# Patient Record
Sex: Female | Born: 1970
Health system: Southern US, Community
[De-identification: ages and names within clinical notes are randomized; demographics above are authoritative.]

## PROBLEM LIST (undated history)

## (undated) DIAGNOSIS — B029 Zoster without complications: Secondary | ICD-10-CM

## (undated) DIAGNOSIS — I10 Essential (primary) hypertension: Secondary | ICD-10-CM

## (undated) DIAGNOSIS — D649 Anemia, unspecified: Secondary | ICD-10-CM

## (undated) DIAGNOSIS — E538 Deficiency of other specified B group vitamins: Secondary | ICD-10-CM

## (undated) DIAGNOSIS — E559 Vitamin D deficiency, unspecified: Secondary | ICD-10-CM

## (undated) DIAGNOSIS — G35 Multiple sclerosis: Secondary | ICD-10-CM

## (undated) DIAGNOSIS — N920 Excessive and frequent menstruation with regular cycle: Secondary | ICD-10-CM

## (undated) DIAGNOSIS — H539 Unspecified visual disturbance: Secondary | ICD-10-CM

## (undated) HISTORY — DX: Multiple sclerosis: G35

## (undated) HISTORY — DX: Vitamin D deficiency, unspecified: E55.9

## (undated) HISTORY — DX: Anemia, unspecified: D64.9

## (undated) HISTORY — DX: Essential (primary) hypertension: I10

## (undated) HISTORY — PX: CHOLECYSTECTOMY: SHX55

## (undated) HISTORY — DX: Excessive and frequent menstruation with regular cycle: N92.0

## (undated) HISTORY — DX: Deficiency of other specified B group vitamins: E53.8

## (undated) HISTORY — DX: Unspecified visual disturbance: H53.9

## (undated) HISTORY — PX: GASTRIC BYPASS: SHX52

## (undated) HISTORY — DX: Zoster without complications: B02.9

---

## 2011-12-31 ENCOUNTER — Emergency Department: Payer: Self-pay | Admitting: Emergency Medicine

## 2011-12-31 LAB — CBC
HCT: 29.9 % — ABNORMAL LOW (ref 35.0–47.0)
MCH: 21.5 pg — ABNORMAL LOW (ref 26.0–34.0)
MCHC: 30.7 g/dL — ABNORMAL LOW (ref 32.0–36.0)
MCV: 70 fL — ABNORMAL LOW (ref 80–100)
WBC: 4.6 10*3/uL (ref 3.6–11.0)

## 2011-12-31 LAB — TROPONIN I: Troponin-I: <0.02 ng/mL

## 2011-12-31 LAB — URINALYSIS, COMPLETE
Bacteria: NONE SEEN
Ketone: NEGATIVE
Leukocyte Esterase: NEGATIVE
Nitrite: NEGATIVE
Squamous Epithelial: 2
WBC UR: 1 /HPF (ref 0–5)

## 2011-12-31 LAB — BASIC METABOLIC PANEL
BUN: 7 mg/dL (ref 7–18)
Calcium, Total: 8.8 mg/dL (ref 8.5–10.1)
Creatinine: 1.12 mg/dL (ref 0.60–1.30)
EGFR (African American): >60
EGFR (Non-African Amer.): >60
Osmolality: 279 (ref 275–301)
Potassium: 3.6 mmol/L (ref 3.5–5.1)
Sodium: 141 mmol/L (ref 136–145)

## 2011-12-31 LAB — CK TOTAL AND CKMB (NOT AT ARMC)
CK, Total: 88 U/L (ref 21–215)
CK-MB: 0.5 ng/mL (ref 0.5–3.6)

## 2011-12-31 LAB — PREGNANCY, URINE: Pregnancy Test, Urine: NEGATIVE m[IU]/mL

## 2011-12-31 LAB — LIPASE, BLOOD: Lipase: 132 U/L (ref 73–393)

## 2012-01-06 ENCOUNTER — Emergency Department: Payer: Self-pay | Admitting: Emergency Medicine

## 2012-01-06 LAB — URINALYSIS, COMPLETE
Blood: NEGATIVE
Glucose,UR: NEGATIVE mg/dL (ref 0–75)
Ketone: NEGATIVE
Leukocyte Esterase: NEGATIVE
Nitrite: NEGATIVE
Ph: 6 (ref 4.5–8.0)
RBC,UR: <1 /HPF (ref 0–5)
Specific Gravity: 1.014 (ref 1.003–1.030)

## 2012-01-06 LAB — CBC
HGB: 8.7 g/dL — ABNORMAL LOW (ref 12.0–16.0)
MCV: 71 fL — ABNORMAL LOW (ref 80–100)
RBC: 4 10*6/uL (ref 3.80–5.20)

## 2012-01-06 LAB — BASIC METABOLIC PANEL
Anion Gap: 10 (ref 7–16)
BUN: 8 mg/dL (ref 7–18)
Chloride: 107 mmol/L (ref 98–107)
Co2: 26 mmol/L (ref 21–32)
Creatinine: 0.9 mg/dL (ref 0.60–1.30)
EGFR (Non-African Amer.): >60

## 2012-01-06 LAB — PREGNANCY, URINE: Pregnancy Test, Urine: NEGATIVE m[IU]/mL

## 2012-01-13 ENCOUNTER — Ambulatory Visit: Payer: Self-pay | Admitting: Surgery

## 2012-01-13 LAB — BASIC METABOLIC PANEL
BUN: 7 mg/dL (ref 7–18)
Calcium, Total: 8.4 mg/dL — ABNORMAL LOW (ref 8.5–10.1)
Chloride: 108 mmol/L — ABNORMAL HIGH (ref 98–107)
Creatinine: 0.93 mg/dL (ref 0.60–1.30)
EGFR (Non-African Amer.): >60
Glucose: 78 mg/dL (ref 65–99)
Osmolality: 280 (ref 275–301)
Sodium: 142 mmol/L (ref 136–145)

## 2012-01-13 LAB — CBC WITH DIFFERENTIAL/PLATELET
Basophil #: 0 10*3/uL (ref 0.0–0.1)
Eosinophil #: 0 10*3/uL (ref 0.0–0.7)
HCT: 27.9 % — ABNORMAL LOW (ref 35.0–47.0)
HGB: 8.6 g/dL — ABNORMAL LOW (ref 12.0–16.0)
Lymphocyte #: 0.2 10*3/uL — ABNORMAL LOW (ref 1.0–3.6)
Lymphocyte %: 4.9 %
MCH: 22 pg — ABNORMAL LOW (ref 26.0–34.0)
MCHC: 30.8 g/dL — ABNORMAL LOW (ref 32.0–36.0)
MCV: 71 fL — ABNORMAL LOW (ref 80–100)
Neutrophil #: 3.5 10*3/uL (ref 1.4–6.5)
Neutrophil %: 82.5 %
RBC: 3.91 10*6/uL (ref 3.80–5.20)

## 2012-01-13 LAB — HEPATIC FUNCTION PANEL A (ARMC)
Alkaline Phosphatase: 87 U/L (ref 50–136)
Bilirubin, Direct: <0.1 mg/dL (ref 0.00–0.20)
Bilirubin,Total: 0.4 mg/dL (ref 0.2–1.0)
SGOT(AST): 18 U/L (ref 15–37)
SGPT (ALT): 16 U/L

## 2012-01-20 ENCOUNTER — Ambulatory Visit: Payer: Self-pay | Admitting: Surgery

## 2012-01-20 LAB — PREGNANCY, URINE: Pregnancy Test, Urine: NEGATIVE m[IU]/mL

## 2012-01-21 LAB — COMPREHENSIVE METABOLIC PANEL
Alkaline Phosphatase: 139 U/L — ABNORMAL HIGH (ref 50–136)
BUN: 6 mg/dL — ABNORMAL LOW (ref 7–18)
Bilirubin,Total: 0.7 mg/dL (ref 0.2–1.0)
Calcium, Total: 8.3 mg/dL — ABNORMAL LOW (ref 8.5–10.1)
Chloride: 107 mmol/L (ref 98–107)
EGFR (African American): >60
EGFR (Non-African Amer.): >60
Glucose: 96 mg/dL (ref 65–99)
Potassium: 4.3 mmol/L (ref 3.5–5.1)
SGOT(AST): 99 U/L — ABNORMAL HIGH (ref 15–37)
SGPT (ALT): 73 U/L
Sodium: 140 mmol/L (ref 136–145)
Total Protein: 5.8 g/dL — ABNORMAL LOW (ref 6.4–8.2)

## 2012-01-21 LAB — CBC WITH DIFFERENTIAL/PLATELET
Basophil %: 0.1 %
Eosinophil %: 0 %
HCT: 26.6 % — ABNORMAL LOW (ref 35.0–47.0)
HGB: 8.2 g/dL — ABNORMAL LOW (ref 12.0–16.0)
Lymphocyte #: 0.2 10*3/uL — ABNORMAL LOW (ref 1.0–3.6)
Lymphocyte %: 4.7 %
MCH: 22.5 pg — ABNORMAL LOW (ref 26.0–34.0)
MCHC: 31 g/dL — ABNORMAL LOW (ref 32.0–36.0)
MCV: 73 fL — ABNORMAL LOW (ref 80–100)
Monocyte #: 0.5 x10 3/mm (ref 0.2–0.9)
Monocyte %: 10.3 %
Neutrophil #: 4 10*3/uL (ref 1.4–6.5)
WBC: 4.7 10*3/uL (ref 3.6–11.0)

## 2012-03-29 ENCOUNTER — Ambulatory Visit: Payer: Self-pay | Admitting: Family Medicine

## 2012-04-10 ENCOUNTER — Ambulatory Visit: Payer: Self-pay | Admitting: Family Medicine

## 2012-07-05 ENCOUNTER — Ambulatory Visit: Payer: Self-pay | Admitting: Family Medicine

## 2012-07-17 ENCOUNTER — Emergency Department: Payer: Self-pay | Admitting: Emergency Medicine

## 2012-07-17 LAB — CBC
MCH: 23.6 pg — ABNORMAL LOW (ref 26.0–34.0)
MCV: 75 fL — ABNORMAL LOW (ref 80–100)
Platelet: 233 10*3/uL (ref 150–440)
RBC: 3.62 10*6/uL — ABNORMAL LOW (ref 3.80–5.20)
RDW: 18.8 % — ABNORMAL HIGH (ref 11.5–14.5)

## 2012-07-17 LAB — TROPONIN I: Troponin-I: <0.02 ng/mL

## 2012-07-17 LAB — BASIC METABOLIC PANEL
Anion Gap: 7 (ref 7–16)
BUN: 10 mg/dL (ref 7–18)
Creatinine: 0.82 mg/dL (ref 0.60–1.30)
EGFR (African American): >60
Osmolality: 279 (ref 275–301)
Sodium: 141 mmol/L (ref 136–145)

## 2012-07-17 LAB — CK TOTAL AND CKMB (NOT AT ARMC): CK-MB: <0.5 ng/mL — ABNORMAL LOW (ref 0.5–3.6)

## 2013-01-05 ENCOUNTER — Emergency Department: Payer: Self-pay | Admitting: Emergency Medicine

## 2013-01-05 LAB — TROPONIN I: Troponin-I: <0.02 ng/mL

## 2013-01-05 LAB — CBC
MCH: 23.1 pg — ABNORMAL LOW (ref 26.0–34.0)
MCHC: 31.5 g/dL — ABNORMAL LOW (ref 32.0–36.0)
MCV: 73 fL — ABNORMAL LOW (ref 80–100)
Platelet: 227 10*3/uL (ref 150–440)
RBC: 4.21 10*6/uL (ref 3.80–5.20)
RDW: 16.5 % — ABNORMAL HIGH (ref 11.5–14.5)

## 2013-01-05 LAB — BASIC METABOLIC PANEL
Calcium, Total: 9 mg/dL (ref 8.5–10.1)
Chloride: 107 mmol/L (ref 98–107)
Co2: 26 mmol/L (ref 21–32)
Creatinine: 1.03 mg/dL (ref 0.60–1.30)
EGFR (Non-African Amer.): >60
Glucose: 91 mg/dL (ref 65–99)
Osmolality: 275 (ref 275–301)
Sodium: 138 mmol/L (ref 136–145)

## 2013-12-19 ENCOUNTER — Emergency Department: Payer: Self-pay | Admitting: Emergency Medicine

## 2013-12-20 LAB — PRO B NATRIURETIC PEPTIDE: B-Type Natriuretic Peptide: 181 pg/mL — ABNORMAL HIGH (ref 0–125)

## 2013-12-20 LAB — BASIC METABOLIC PANEL
Anion Gap: 6 — ABNORMAL LOW (ref 7–16)
BUN: 10 mg/dL (ref 7–18)
CALCIUM: 8.8 mg/dL (ref 8.5–10.1)
CO2: 28 mmol/L (ref 21–32)
CREATININE: 1.09 mg/dL (ref 0.60–1.30)
Chloride: 106 mmol/L (ref 98–107)
EGFR (Non-African Amer.): >60
Glucose: 91 mg/dL (ref 65–99)
Osmolality: 278 (ref 275–301)
Potassium: 3.5 mmol/L (ref 3.5–5.1)
Sodium: 140 mmol/L (ref 136–145)

## 2013-12-20 LAB — CBC
HCT: 27.2 % — ABNORMAL LOW (ref 35.0–47.0)
HGB: 8.4 g/dL — AB (ref 12.0–16.0)
MCH: 21.6 pg — AB (ref 26.0–34.0)
MCHC: 30.8 g/dL — ABNORMAL LOW (ref 32.0–36.0)
MCV: 70 fL — ABNORMAL LOW (ref 80–100)
Platelet: 231 10*3/uL (ref 150–440)
RBC: 3.88 10*6/uL (ref 3.80–5.20)
RDW: 18.2 % — ABNORMAL HIGH (ref 11.5–14.5)
WBC: 5.3 10*3/uL (ref 3.6–11.0)

## 2013-12-20 LAB — TROPONIN I

## 2013-12-26 ENCOUNTER — Emergency Department: Payer: Self-pay | Admitting: Emergency Medicine

## 2013-12-26 LAB — CBC WITH DIFFERENTIAL/PLATELET
BASOS PCT: 1.4 %
Basophil #: 0.1 10*3/uL (ref 0.0–0.1)
Eosinophil #: 0 10*3/uL (ref 0.0–0.7)
Eosinophil %: 0 %
HCT: 27.5 % — ABNORMAL LOW (ref 35.0–47.0)
HGB: 8.4 g/dL — ABNORMAL LOW (ref 12.0–16.0)
LYMPHS PCT: 6.3 %
Lymphocyte #: 0.3 10*3/uL — ABNORMAL LOW (ref 1.0–3.6)
MCH: 21.4 pg — ABNORMAL LOW (ref 26.0–34.0)
MCHC: 30.6 g/dL — AB (ref 32.0–36.0)
MCV: 70 fL — AB (ref 80–100)
Monocyte #: 0.5 x10 3/mm (ref 0.2–0.9)
Monocyte %: 11.4 %
Neutrophil #: 3.4 10*3/uL (ref 1.4–6.5)
Neutrophil %: 80.9 %
Platelet: 256 10*3/uL (ref 150–440)
RBC: 3.94 10*6/uL (ref 3.80–5.20)
RDW: 17.8 % — ABNORMAL HIGH (ref 11.5–14.5)
WBC: 4.3 10*3/uL (ref 3.6–11.0)

## 2013-12-26 LAB — COMPREHENSIVE METABOLIC PANEL
ALBUMIN: 3.8 g/dL (ref 3.4–5.0)
ANION GAP: 5 — AB (ref 7–16)
Alkaline Phosphatase: 115 U/L
BILIRUBIN TOTAL: 0.4 mg/dL (ref 0.2–1.0)
BUN: 8 mg/dL (ref 7–18)
CO2: 28 mmol/L (ref 21–32)
Calcium, Total: 8.8 mg/dL (ref 8.5–10.1)
Chloride: 106 mmol/L (ref 98–107)
Creatinine: 1.09 mg/dL (ref 0.60–1.30)
Glucose: 94 mg/dL (ref 65–99)
Osmolality: 276 (ref 275–301)
Potassium: 3.6 mmol/L (ref 3.5–5.1)
SGOT(AST): 23 U/L (ref 15–37)
SGPT (ALT): 20 U/L (ref 12–78)
SODIUM: 139 mmol/L (ref 136–145)
TOTAL PROTEIN: 7.3 g/dL (ref 6.4–8.2)

## 2013-12-26 LAB — TROPONIN I

## 2013-12-26 LAB — D-DIMER(ARMC): D-DIMER: 432 ng/mL

## 2013-12-27 DIAGNOSIS — R079 Chest pain, unspecified: Secondary | ICD-10-CM | POA: Insufficient documentation

## 2013-12-27 DIAGNOSIS — R002 Palpitations: Secondary | ICD-10-CM | POA: Insufficient documentation

## 2013-12-27 DIAGNOSIS — R0602 Shortness of breath: Secondary | ICD-10-CM | POA: Insufficient documentation

## 2014-06-27 ENCOUNTER — Ambulatory Visit: Payer: Self-pay | Admitting: Hematology and Oncology

## 2014-06-27 LAB — COMPREHENSIVE METABOLIC PANEL
ALK PHOS: 105 U/L
Albumin: 3.8 g/dL (ref 3.4–5.0)
Anion Gap: 5 — ABNORMAL LOW (ref 7–16)
BILIRUBIN TOTAL: 0.4 mg/dL (ref 0.2–1.0)
BUN: 7 mg/dL (ref 7–18)
CO2: 28 mmol/L (ref 21–32)
CREATININE: 0.99 mg/dL (ref 0.60–1.30)
Calcium, Total: 8.3 mg/dL — ABNORMAL LOW (ref 8.5–10.1)
Chloride: 106 mmol/L (ref 98–107)
EGFR (African American): >60
Glucose: 89 mg/dL (ref 65–99)
OSMOLALITY: 275 (ref 275–301)
Potassium: 3.9 mmol/L (ref 3.5–5.1)
SGOT(AST): 15 U/L (ref 15–37)
SGPT (ALT): 20 U/L
SODIUM: 139 mmol/L (ref 136–145)
TOTAL PROTEIN: 6.9 g/dL (ref 6.4–8.2)

## 2014-06-27 LAB — CBC CANCER CENTER
Basophil #: 0 x10 3/mm (ref 0.0–0.1)
Basophil %: 1 %
EOS ABS: 0 x10 3/mm (ref 0.0–0.7)
Eosinophil %: 0 %
HCT: 27.1 % — ABNORMAL LOW (ref 35.0–47.0)
HGB: 8.1 g/dL — ABNORMAL LOW (ref 12.0–16.0)
LYMPHS ABS: 0.3 x10 3/mm — AB (ref 1.0–3.6)
LYMPHS PCT: 8.1 %
MCH: 19.8 pg — ABNORMAL LOW (ref 26.0–34.0)
MCHC: 29.8 g/dL — ABNORMAL LOW (ref 32.0–36.0)
MCV: 67 fL — ABNORMAL LOW (ref 80–100)
MONO ABS: 0.4 x10 3/mm (ref 0.2–0.9)
Monocyte %: 11.7 %
NEUTROS ABS: 3 x10 3/mm (ref 1.4–6.5)
NEUTROS PCT: 79.2 %
Platelet: 261 x10 3/mm (ref 150–440)
RBC: 4.07 10*6/uL (ref 3.80–5.20)
RDW: 18.6 % — ABNORMAL HIGH (ref 11.5–14.5)
WBC: 3.8 x10 3/mm (ref 3.6–11.0)

## 2014-06-27 LAB — IRON AND TIBC
IRON BIND. CAP.(TOTAL): 424 ug/dL (ref 250–450)
IRON SATURATION: 2 %
IRON: 10 ug/dL — AB (ref 50–170)
Unbound Iron-Bind.Cap.: 414 ug/dL

## 2014-06-27 LAB — FERRITIN: Ferritin (ARMC): 3 ng/mL — ABNORMAL LOW (ref 8–388)

## 2014-06-27 LAB — RETICULOCYTES
Absolute Retic Count: 0.0478 10*6/uL (ref 0.019–0.186)
RETICULOCYTE: 1.17 % (ref 0.4–3.1)

## 2014-06-27 LAB — MAGNESIUM: Magnesium: 1.9 mg/dL

## 2014-06-29 LAB — PROT IMMUNOELECTROPHORES(ARMC)

## 2014-07-10 ENCOUNTER — Ambulatory Visit: Payer: Self-pay | Admitting: Hematology and Oncology

## 2014-07-11 DIAGNOSIS — I38 Endocarditis, valve unspecified: Secondary | ICD-10-CM | POA: Insufficient documentation

## 2014-07-11 DIAGNOSIS — I119 Hypertensive heart disease without heart failure: Secondary | ICD-10-CM | POA: Insufficient documentation

## 2014-07-19 LAB — IRON AND TIBC
IRON BIND. CAP.(TOTAL): 387 ug/dL (ref 250–450)
Iron Saturation: 36 %
Iron: 141 ug/dL (ref 50–170)
Unbound Iron-Bind.Cap.: 246 ug/dL

## 2014-07-19 LAB — CBC CANCER CENTER
Basophil #: 0 x10 3/mm (ref 0.0–0.1)
Basophil %: 0.2 %
EOS PCT: 0 %
Eosinophil #: 0 x10 3/mm (ref 0.0–0.7)
HCT: 33 % — AB (ref 35.0–47.0)
HGB: 10.3 g/dL — ABNORMAL LOW (ref 12.0–16.0)
Lymphocyte #: 0.2 x10 3/mm — ABNORMAL LOW (ref 1.0–3.6)
Lymphocyte %: 5.7 %
MCH: 22.7 pg — ABNORMAL LOW (ref 26.0–34.0)
MCHC: 31.1 g/dL — ABNORMAL LOW (ref 32.0–36.0)
MCV: 73 fL — ABNORMAL LOW (ref 80–100)
Monocyte #: 0.4 x10 3/mm (ref 0.2–0.9)
Monocyte %: 11.3 %
NEUTROS ABS: 2.7 x10 3/mm (ref 1.4–6.5)
Neutrophil %: 82.8 %
Platelet: 224 x10 3/mm (ref 150–440)
RBC: 4.51 10*6/uL (ref 3.80–5.20)
RDW: 26.9 % — ABNORMAL HIGH (ref 11.5–14.5)
WBC: 3.2 x10 3/mm — ABNORMAL LOW (ref 3.6–11.0)

## 2014-07-19 LAB — FERRITIN: FERRITIN (ARMC): 99 ng/mL (ref 8–388)

## 2014-08-08 LAB — CANCER CENTER HEMOGLOBIN: HGB: 10.9 g/dL — AB (ref 12.0–16.0)

## 2014-08-10 ENCOUNTER — Ambulatory Visit: Payer: Self-pay | Admitting: Hematology and Oncology

## 2014-09-07 LAB — CBC CANCER CENTER
Basophil #: 0 x10 3/mm (ref 0.0–0.1)
Basophil %: 0.3 %
EOS PCT: 0.1 %
Eosinophil #: 0 x10 3/mm (ref 0.0–0.7)
HCT: 38.3 % (ref 35.0–47.0)
HGB: 12.5 g/dL (ref 12.0–16.0)
LYMPHS ABS: 0.2 x10 3/mm — AB (ref 1.0–3.6)
Lymphocyte %: 5.7 %
MCH: 26.9 pg (ref 26.0–34.0)
MCHC: 32.6 g/dL (ref 32.0–36.0)
MCV: 83 fL (ref 80–100)
MONOS PCT: 11.8 %
Monocyte #: 0.4 x10 3/mm (ref 0.2–0.9)
NEUTROS ABS: 2.6 x10 3/mm (ref 1.4–6.5)
Neutrophil %: 82.1 %
Platelet: 195 x10 3/mm (ref 150–440)
RBC: 4.63 10*6/uL (ref 3.80–5.20)
RDW: 23.5 % — ABNORMAL HIGH (ref 11.5–14.5)
WBC: 3.1 x10 3/mm — AB (ref 3.6–11.0)

## 2014-09-07 LAB — IRON AND TIBC
IRON BIND. CAP.(TOTAL): 309 ug/dL (ref 250–450)
Iron Saturation: 20 %
Iron: 63 ug/dL (ref 50–170)
Unbound Iron-Bind.Cap.: 246 ug/dL

## 2014-09-07 LAB — FERRITIN: Ferritin (ARMC): 52 ng/mL (ref 8–388)

## 2014-09-10 ENCOUNTER — Ambulatory Visit: Payer: Self-pay | Admitting: Internal Medicine

## 2014-09-10 ENCOUNTER — Ambulatory Visit: Payer: Self-pay | Admitting: Hematology and Oncology

## 2014-10-09 ENCOUNTER — Ambulatory Visit
Admit: 2014-10-09 | Disposition: A | Discharge status: Home / Self Care | Payer: Self-pay | Attending: Hematology and Oncology | Admitting: Hematology and Oncology

## 2014-11-09 ENCOUNTER — Ambulatory Visit
Admit: 2014-11-09 | Disposition: A | Discharge status: Home / Self Care | Payer: Self-pay | Attending: Hematology and Oncology | Admitting: Hematology and Oncology

## 2014-11-30 ENCOUNTER — Other Ambulatory Visit: Payer: Self-pay | Admitting: Neurology

## 2014-11-30 NOTE — Telephone Encounter (Signed)
Was a [facility] pt.  One r/f sent in and lmom for her to call for appt.--will not be able to refill again until she is seen/fim

## 2014-12-02 NOTE — Op Note (Signed)
PATIENT NAME:  Jody Lee, Jody Lee MR#:  [id number] DATE OF BIRTH:  03/07/1971  DATE OF PROCEDURE:  01/20/2012  PREOPERATIVE DIAGNOSIS: Symptomatic cholelithiasis.   POSTOPERATIVE DIAGNOSIS: Symptomatic cholelithiasis.   PROCEDURE: Laparoscopic cholecystectomy.   SURGEON: Adelise Buswell E. Excell Seltzer, MD  ANESTHESIA: General with endotracheal tube.   INDICATIONS: This is a patient with recurrent epigastric and right upper quadrant pain associated with fatty food intolerance and work-up showing gallstones. Preoperatively we discussed rationale for surgery. She has had a prior open Roux-en-Y bypass for her morbid obesity. We discussed specifically the difficulty in obtaining access and the potential for having to convert to an open procedure to avoid bowel injury due to adhesions and the risks of bleeding, infection, recurrence of symptoms, failure to resolve her symptoms, open procedure, bile duct damage, bile duct leak, and the inability to perform an ERCP postoperatively because of her prior bypass. This was all reviewed for she and her family in the preop holding area. They understood and agreed to proceed. We discussed possible bile duct injury requiring additional surgery including common duct exploration if retained stones were to become evident in the postoperative period but her liver function tests are completely normal preop. She understood and agreed to proceed.Marland Kitchen   FINDINGS: Very diminutive cystic duct, unlikely that it would have amenable to cholangiography because of its tiny size. Cystic artery was very small as well. Adhesions on the anterior surface of a very distended gallbladder. The fundus of the gallbladder seemed edematous.   There were adhesions in the epigastric area. No adhesions in the infraumbilical area or pelvis. No sign of bowel injury upon entry.   DESCRIPTION OF PROCEDURE: Patient was induced to general anesthesia, given IV antibiotics. VTE prophylaxis was in place. She was prepped  and draped in sterile fashion. Marcaine was infiltrated in skin and subcutaneous tissues in the infraumbilical area well away from her upper abdominal midline incision. An incision was made. An extra long Veress needle was utilized to obtain a pneumoperitoneum and then with the camera in the Visiport function of the 5 mm trocar the abdominal cavity was entered and explored. Under direct vision a 10 mm epigastric port and two lateral 5 mm ports were placed. This was made difficult because of her morbid obesity. The camera was placed in both lateral ports as well as the epigastric site viewing back at the periumbilical site. There was no sign of adhesions or bowel injury, however, there was some evidence of insufflation of the preperitoneal space by CO2 with the Veress needle. This was observed and decreased during the case.   Again, no sign of bowel injury or adhesions in the infraumbilical area at entry.   The gallbladder was placed on tension. Peritoneum over the infundibulum was incised bluntly. The cystic duct/gallbladder junction was well identified. Again, the cystic duct was found to be very diminutive. It was doubly clipped and divided. The cystic artery was doubly clipped and divided and the gallbladder was taken from the gallbladder fossa with electrocautery, passed out through the epigastric port site with the aid of an Endo Catch bag. The area was irrigated with copious amounts of normal saline. Hemostasis was maintained with electrocautery and checked and found to be adequate. There was no sign of bile leak or bleeding at this point. Again the camera was placed back in the epigastric site to view back at the periumbilical site. The insufflated preperitoneal area had diminished considerably and there was no sign of bowel injury and once assuring that  hemostasis was adequate pneumoperitoneum was released. All ports were removed after placing a 10 mm JP drain into the foramen of Winslow and tying it in  with 3-0 nylon. An attempt at closure of the epigastric site was performed with figure-of-eight 0 Vicryl which was made difficult due to her morbid obesity and then 4-0 subcuticular Monocryl was used on all skin edges. Steri-Strips, Mastisol, and sterile dressings were placed. The JP drain was placed to bulb suction.   Patient tolerated this procedure well. There were no complications. She was taken to the recovery room in stable condition to be admitted for observation.   ____________________________ Adah Salvage Excell Seltzer, MD rec:cms D: 01/20/2012 10:23:00 ET T: 01/20/2012 11:27:22 ET JOB#: [id number]  cc: Adah Salvage. Excell Seltzer, MD, <Dictator> Lattie Haw MD ELECTRONICALLY SIGNED 01/20/2012 19:51

## 2014-12-02 NOTE — H&P (Signed)
PATIENT NAME:  Jody Lee, Jody Lee MR#:  [id number] DATE OF BIRTH:  1971-07-03  DATE OF ADMISSION:  01/19/2012  CHIEF COMPLAINT: Right upper quadrant pain.   HISTORY OF PRESENT ILLNESS: This is a patient with gallstones.  She has been in the Emergency Room twice with nausea but no emesis. She has had pain in the right upper quadrant, epigastrium, and back. She had some cramping with fatty food intolerance and had no prior episode prior to several weeks ago.  She has had open gastric bypass surgery in the past.   PAST MEDICAL HISTORY:  1. Multiple sclerosis. 2. Cholelithiasis. 3. Morbid obesity.   PAST SURGICAL HISTORY: Gastric bypass surgery.   ALLERGIES: None.   MEDICATIONS: None.   FAMILY HISTORY: Noncontributory.   SOCIAL HISTORY: The patient is a nonsmoker, does not drink alcohol, and is employed.   REVIEW OF SYSTEMS: 10-system review has been performed and negative with the exception of that mentioned in the history of present illness.   PHYSICAL EXAMINATION:  GENERAL: Morbidly obese female patient, 270 pounds, 5 foot 4.   HEENT: No scleral icterus.   NECK: No palpable neck nodes.   CHEST: Clear to auscultation.   CARDIAC: Regular rate and rhythm.   ABDOMEN: Obese, soft, nontender. There is a midline scar in the epigastrium to the supraumbilical area. Abdomen is otherwise nontender.   EXTREMITIES: Without edema. Calves are nontender.   NEUROLOGIC: Grossly intact.   INTEGUMENT: No jaundice.  Ultrasound shows stones.   LABORATORY DATA: Laboratory values show no sign of choledocholithiasis. She is anemic.   ASSESSMENT AND PLAN: This is a patient with symptomatic cholelithiasis. I have recommended laparoscopic cholecystectomy. The rationale for surgery has been discussed. The options of observation have been reviewed and the risks of bleeding, infection, inability to proceed with the laparoscope, bowel injury, bile duct damage, bile duct leak, retained common bile duct  stone, the inability to perform an ERCP due to her gastric bypass have all been discussed with her. She understood and agreed to proceed.  ____________________________ Adah Salvage Excell Seltzer, MD rec:bjt D: 01/18/2012 14:24:11 ET T: 01/18/2012 14:49:32 ET JOB#: [id number]  cc: Adah Salvage. Excell Seltzer, MD, <Dictator> Lattie Haw MD ELECTRONICALLY SIGNED 01/18/2012 19:46

## 2014-12-04 ENCOUNTER — Ambulatory Visit
Admit: 2014-12-04 | Disposition: A | Discharge status: Home / Self Care | Payer: Self-pay | Attending: Surgery | Admitting: Surgery

## 2015-01-03 DIAGNOSIS — I10 Essential (primary) hypertension: Secondary | ICD-10-CM | POA: Insufficient documentation

## 2015-01-04 ENCOUNTER — Other Ambulatory Visit: Payer: Self-pay

## 2015-01-04 ENCOUNTER — Inpatient Hospital Stay: Payer: 59 | Attending: Hematology and Oncology

## 2015-01-04 ENCOUNTER — Inpatient Hospital Stay: Payer: 59

## 2015-01-04 ENCOUNTER — Encounter (INDEPENDENT_AMBULATORY_CARE_PROVIDER_SITE_OTHER): Payer: Self-pay

## 2015-01-04 DIAGNOSIS — E538 Deficiency of other specified B group vitamins: Secondary | ICD-10-CM | POA: Diagnosis not present

## 2015-01-04 DIAGNOSIS — D649 Anemia, unspecified: Secondary | ICD-10-CM

## 2015-01-04 LAB — VITAMIN B12: Vitamin B-12: 177 pg/mL — ABNORMAL LOW (ref 180–914)

## 2015-01-04 LAB — CBC
HCT: 39.6 % (ref 35.0–47.0)
Hemoglobin: 13.1 g/dL (ref 12.0–16.0)
MCH: 29.1 pg (ref 26.0–34.0)
MCHC: 33.1 g/dL (ref 32.0–36.0)
MCV: 88 fL (ref 80.0–100.0)
Platelets: 208 10*3/uL (ref 150–440)
RBC: 4.5 MIL/uL (ref 3.80–5.20)
RDW: 14 % (ref 11.5–14.5)
WBC: 3 10*3/uL — ABNORMAL LOW (ref 3.6–11.0)

## 2015-01-04 LAB — IRON AND TIBC
Iron: 83 ug/dL (ref 28–170)
Saturation Ratios: 28 % (ref 10.4–31.8)
TIBC: 294 ug/dL (ref 250–450)
UIBC: 211 ug/dL

## 2015-01-04 LAB — FERRITIN: Ferritin: 62 ng/mL (ref 11–307)

## 2015-01-04 MED ORDER — CYANOCOBALAMIN 1000 MCG/ML IJ SOLN
1000.0000 ug | Freq: Once | INTRAMUSCULAR | Status: AC
  Administered 2015-01-04: 1000 ug via INTRAMUSCULAR
  Filled 2015-01-04: qty 1

## 2015-01-05 LAB — VITAMIN D 25 HYDROXY (VIT D DEFICIENCY, FRACTURES): Vit D, 25-Hydroxy: 13.6 ng/mL — ABNORMAL LOW (ref 30.0–100.0)

## 2015-01-11 ENCOUNTER — Inpatient Hospital Stay: Payer: 59 | Attending: Hematology and Oncology | Admitting: Hematology and Oncology

## 2015-01-11 ENCOUNTER — Inpatient Hospital Stay: Payer: 59

## 2015-01-11 VITALS — BP 129/84 | HR 76 | Temp 98.6°F | Wt 228.8 lb

## 2015-01-11 DIAGNOSIS — E559 Vitamin D deficiency, unspecified: Secondary | ICD-10-CM | POA: Diagnosis not present

## 2015-01-11 DIAGNOSIS — G629 Polyneuropathy, unspecified: Secondary | ICD-10-CM | POA: Insufficient documentation

## 2015-01-11 DIAGNOSIS — G35 Multiple sclerosis: Secondary | ICD-10-CM | POA: Diagnosis not present

## 2015-01-11 DIAGNOSIS — Z79899 Other long term (current) drug therapy: Secondary | ICD-10-CM | POA: Insufficient documentation

## 2015-01-11 DIAGNOSIS — Z9884 Bariatric surgery status: Secondary | ICD-10-CM | POA: Diagnosis not present

## 2015-01-11 DIAGNOSIS — E538 Deficiency of other specified B group vitamins: Secondary | ICD-10-CM | POA: Diagnosis not present

## 2015-01-11 DIAGNOSIS — D509 Iron deficiency anemia, unspecified: Secondary | ICD-10-CM | POA: Insufficient documentation

## 2015-01-11 DIAGNOSIS — I10 Essential (primary) hypertension: Secondary | ICD-10-CM | POA: Insufficient documentation

## 2015-01-11 MED ORDER — CYANOCOBALAMIN 1000 MCG/ML IJ SOLN
1000.0000 ug | INTRAMUSCULAR | Status: DC
  Administered 2015-01-11: 1000 ug via INTRAMUSCULAR
  Filled 2015-01-11: qty 1

## 2015-01-18 ENCOUNTER — Ambulatory Visit: Payer: 59

## 2015-01-18 ENCOUNTER — Other Ambulatory Visit: Payer: 59

## 2015-01-25 ENCOUNTER — Inpatient Hospital Stay: Payer: 59

## 2015-01-25 DIAGNOSIS — E538 Deficiency of other specified B group vitamins: Secondary | ICD-10-CM

## 2015-01-25 DIAGNOSIS — D649 Anemia, unspecified: Secondary | ICD-10-CM

## 2015-01-25 DIAGNOSIS — D509 Iron deficiency anemia, unspecified: Secondary | ICD-10-CM | POA: Diagnosis not present

## 2015-01-25 LAB — FOLATE: Folate: 12.5 ng/mL (ref >5.9)

## 2015-01-25 MED ORDER — CYANOCOBALAMIN 1000 MCG/ML IJ SOLN
1000.0000 ug | INTRAMUSCULAR | Status: DC
  Administered 2015-01-25: 1000 ug via INTRAMUSCULAR
  Filled 2015-01-25: qty 1

## 2015-02-01 ENCOUNTER — Inpatient Hospital Stay: Payer: 59

## 2015-02-01 DIAGNOSIS — E538 Deficiency of other specified B group vitamins: Secondary | ICD-10-CM

## 2015-02-01 DIAGNOSIS — D509 Iron deficiency anemia, unspecified: Secondary | ICD-10-CM | POA: Diagnosis not present

## 2015-02-01 MED ORDER — CYANOCOBALAMIN 1000 MCG/ML IJ SOLN
1000.0000 ug | INTRAMUSCULAR | Status: AC
  Administered 2015-02-01: 1000 ug via INTRAMUSCULAR
  Filled 2015-02-01: qty 1

## 2015-02-08 ENCOUNTER — Inpatient Hospital Stay: Payer: 59 | Attending: Hematology and Oncology

## 2015-02-08 VITALS — BP 123/84 | HR 72

## 2015-02-08 DIAGNOSIS — Z9884 Bariatric surgery status: Secondary | ICD-10-CM | POA: Insufficient documentation

## 2015-02-08 DIAGNOSIS — E538 Deficiency of other specified B group vitamins: Secondary | ICD-10-CM | POA: Insufficient documentation

## 2015-02-08 DIAGNOSIS — I10 Essential (primary) hypertension: Secondary | ICD-10-CM | POA: Diagnosis not present

## 2015-02-08 DIAGNOSIS — G35 Multiple sclerosis: Secondary | ICD-10-CM | POA: Diagnosis not present

## 2015-02-08 DIAGNOSIS — Z79899 Other long term (current) drug therapy: Secondary | ICD-10-CM | POA: Insufficient documentation

## 2015-02-08 DIAGNOSIS — E559 Vitamin D deficiency, unspecified: Secondary | ICD-10-CM | POA: Insufficient documentation

## 2015-02-08 DIAGNOSIS — D509 Iron deficiency anemia, unspecified: Secondary | ICD-10-CM | POA: Diagnosis not present

## 2015-02-08 MED ORDER — CYANOCOBALAMIN 1000 MCG/ML IJ SOLN
1000.0000 ug | INTRAMUSCULAR | Status: DC
  Administered 2015-02-08: 1000 ug via INTRAMUSCULAR
  Filled 2015-02-08: qty 1

## 2015-02-15 ENCOUNTER — Inpatient Hospital Stay: Payer: 59

## 2015-02-15 ENCOUNTER — Emergency Department: Payer: 59

## 2015-02-15 ENCOUNTER — Emergency Department
Admission: EM | Admit: 2015-02-15 | Discharge: 2015-02-15 | Disposition: A | Discharge status: Home / Self Care | Payer: 59 | Attending: Emergency Medicine | Admitting: Emergency Medicine

## 2015-02-15 ENCOUNTER — Encounter: Payer: Self-pay | Admitting: *Deleted

## 2015-02-15 DIAGNOSIS — E538 Deficiency of other specified B group vitamins: Secondary | ICD-10-CM | POA: Diagnosis not present

## 2015-02-15 DIAGNOSIS — R52 Pain, unspecified: Secondary | ICD-10-CM

## 2015-02-15 DIAGNOSIS — I10 Essential (primary) hypertension: Secondary | ICD-10-CM | POA: Diagnosis not present

## 2015-02-15 DIAGNOSIS — Z79899 Other long term (current) drug therapy: Secondary | ICD-10-CM | POA: Diagnosis not present

## 2015-02-15 DIAGNOSIS — M79605 Pain in left leg: Secondary | ICD-10-CM | POA: Diagnosis present

## 2015-02-15 MED ORDER — CYANOCOBALAMIN 1000 MCG/ML IJ SOLN
1000.0000 ug | INTRAMUSCULAR | Status: DC
  Administered 2015-02-15: 1000 ug via INTRAMUSCULAR
  Filled 2015-02-15: qty 1

## 2015-02-15 NOTE — ED Notes (Signed)
TO ultrasound via stretcher.  AAOx3.  NAD.

## 2015-02-15 NOTE — ED Notes (Signed)
AAOx3.  Skin warm and dry.  Water given to patient.  Discussed immediate plan of care, good understanding verbalized.  Continue to monitor.

## 2015-02-15 NOTE — ED Notes (Signed)
Pt states she has a h/o MS and gets leg cramps on a reg basis but tonight had a pain on the inner portion of her left calf with radiation to her knee. Pt states it was not normal like her leg cramps and is concerned it may be a clot. Pt states she has a h/o "weak veins". Pt also c/o slight headache in the back of her head. AAO x4 answering all questions app.

## 2015-02-15 NOTE — ED Provider Notes (Signed)
[facility] Emergency Department Provider Note  ___________________________________________  Time seen: 0135  I have reviewed the triage vital signs and the nursing notes.   HISTORY  Chief Complaint Leg Pain   History limited by: Not Limited   HPI Jody Lee is a 44 y.o. female who presents to the emergency department today with complaints of left leg pain. She states the pain started suddenly roughly 3 hours ago. The pain started in her left lower leg and then progressed up to her left knee. She described it as a cramping deep pain. She states she does frequently get cramps in her legs however this was dissimilar to her previous pain. She denies any chest pain or shortness of breath but does state she has had a headache has eased off. She does have a history of phlebitis in the past.   Past Medical History  Diagnosis Date  . Hypertension   . Anemia   . MS (multiple sclerosis)   . Vitamin B 12 deficiency   . Vitamin D deficiency     Patient Active Problem List   Diagnosis Date Noted  . B12 deficiency 01/04/2015    Past Surgical History  Procedure Laterality Date  . Gastric bypass    . Cholecystectomy      Current Outpatient Rx  Name  Route  Sig  Dispense  Refill  . acetaminophen (TYLENOL) 325 MG tablet   Oral   Take 650 mg by mouth every 4 (four) hours as needed.         . calcium-vitamin D (OSCAL WITH D) 500-200 MG-UNIT per tablet   Oral   Take 1 tablet by mouth.         . Cyanocobalamin (VITAMIN B 12 PO)   Oral   Take 1 tablet by mouth daily.         . ergocalciferol (VITAMIN D2) 50000 UNITS capsule   Oral   Take 50,000 Units by mouth once a week.         . ferrous sulfate 325 (65 FE) MG tablet   Oral   Take 325 mg by mouth 2 (two) times daily with a meal.         . folic acid (FOLVITE) 1 MG tablet   Oral   Take 2 mg by mouth daily.         Marland Kitchen GILENYA 0.5 MG CAPS      TAKE 1 CAPSULE DAILY   84 capsule    0     Dispense as written.   . hydrochlorothiazide (MICROZIDE) 12.5 MG capsule               . lisinopril (PRINIVIL,ZESTRIL) 40 MG tablet   Oral   Take 40 mg by mouth daily.         . medroxyPROGESTERone (DEPO-SUBQ PROVERA 104) 104 MG/0.65ML injection   Subcutaneous   Inject 150 mg into the skin every 3 (three) months.         . modafinil (PROVIGIL) 200 MG tablet               . Multiple Vitamin (MULTI-VITAMINS) TABS   Oral   Take by mouth.         Marland Kitchen UNABLE TO FIND   Oral   Take 200 mg by mouth daily. Mondafinil 1 tab daily           Allergies Nsaids and Ciprofloxacin  Family History  Problem Relation Age of Onset  . Stroke  Mother   . Hypertension Father   . Heart disease Father     Social History History  Substance Use Topics  . Smoking status: Never Smoker   . Smokeless tobacco: Not on file  . Alcohol Use: No    Review of Systems  Constitutional: Negative for fever. Cardiovascular: Negative for chest pain. Respiratory: Negative for shortness of breath. Gastrointestinal: Negative for abdominal pain, vomiting and diarrhea. Genitourinary: Negative for dysuria. Musculoskeletal: Negative for back pain. Skin: Negative for rash. Neurological: Negative for headaches, focal weakness or numbness.   10-point ROS otherwise negative.  ____________________________________________   PHYSICAL EXAM:  VITAL SIGNS: ED Triage Vitals  Enc Vitals Group     BP 02/15/15 0106 115/72 mmHg     Pulse Rate 02/15/15 0106 68     Resp 02/15/15 0106 18     Temp 02/15/15 0106 97.8 F (36.6 C)     Temp Source 02/15/15 0106 Oral     SpO2 02/15/15 0106 99 %     Weight 02/15/15 0106 230 lb (104.327 kg)     Height 02/15/15 0106 5\' 4"  (1.626 m)     Head Cir --      Peak Flow --      Pain Score 02/15/15 0107 3   Constitutional: Alert and oriented. Well appearing and in no distress. Eyes: Conjunctivae are normal. PERRL. Normal extraocular movements. ENT    Head: Normocephalic and atraumatic.   Nose: No congestion/rhinnorhea.   Mouth/Throat: Mucous membranes are moist.   Neck: No stridor. Hematological/Lymphatic/Immunilogical: No cervical lymphadenopathy. Cardiovascular: Normal rate, regular rhythm.  No murmurs, rubs, or gallops. Respiratory: Normal respiratory effort without tachypnea nor retractions. Breath sounds are clear and equal bilaterally. No wheezes/rales/rhonchi. Genitourinary: Deferred Musculoskeletal: Normal range of motion in all extremities. No joint effusions.  No lower extremity tenderness nor edema. Neurologic:  Normal speech and language. No gross focal neurologic deficits are appreciated. Speech is normal.  Skin:  Skin is warm, dry and intact. No rash noted. Psychiatric: Mood and affect are normal. Speech and behavior are normal. Patient exhibits appropriate insight and judgment.  ____________________________________________    LABS (pertinent positives/negatives)  None  ____________________________________________   EKG  None  ____________________________________________    RADIOLOGY  Left leg US  IMPRESSION: No evidence of left lower extremity deep venous thrombosis.  ____________________________________________   PROCEDURES  Procedure(s) performed: None  Critical Care performed: No  ____________________________________________   INITIAL IMPRESSION / ASSESSMENT AND PLAN / ED COURSE  Pertinent labs & imaging results that were available during my care of the patient were reviewed by me and considered in my medical decision making (see chart for details).  Patient presents with left-sided leg pain. She does have concern for a blood. Think the likelihood of blood clot is low however given history of phlebitis and description of pain will get an ultrasound to evaluate.  ----------------------------------------- 3:12 AM on  02/15/2015 -----------------------------------------  Ultrasound without any evidence of clot. ____________________________________________   FINAL CLINICAL IMPRESSION(S) / ED DIAGNOSES  Final diagnoses:  Pain  Pain of left lower extremity     Phineas Semen, MD 02/15/15 [phone]

## 2015-02-15 NOTE — ED Notes (Signed)
E signature not available. Gives error message. Reported to IT

## 2015-02-15 NOTE — Discharge Instructions (Signed)
Please seek medical attention for any high fevers, chest pain, shortness of breath, change in behavior, persistent vomiting, bloody stool or any other new or concerning symptoms. ° °Musculoskeletal Pain °Musculoskeletal pain is muscle and boney aches and pains. These pains can occur in any part of the body. Your caregiver may treat you without knowing the cause of the pain. They may treat you if blood or urine tests, X-rays, and other tests were normal.  °CAUSES °There is often not a definite cause or reason for these pains. These pains may be caused by a type of germ (virus). The discomfort may also come from overuse. Overuse includes working out too hard when your body is not fit. Boney aches also come from weather changes. Bone is sensitive to atmospheric pressure changes. °HOME CARE INSTRUCTIONS  °· Ask when your test results will be ready. Make sure you get your test results. °· Only take over-the-counter or prescription medicines for pain, discomfort, or fever as directed by your caregiver. If you were given medications for your condition, do not drive, operate machinery or power tools, or sign legal documents for 24 hours. Do not drink alcohol. Do not take sleeping pills or other medications that may interfere with treatment. °· Continue all activities unless the activities cause more pain. When the pain lessens, slowly resume normal activities. Gradually increase the intensity and duration of the activities or exercise. °· During periods of severe pain, bed rest may be helpful. Lay or sit in any position that is comfortable. °· Putting ice on the injured area. °¨ Put ice in a bag. °¨ Place a towel between your skin and the bag. °¨ Leave the ice on for 15 to 20 minutes, 3 to 4 times a day. °· Follow up with your caregiver for continued problems and no reason can be found for the pain. If the pain becomes worse or does not go away, it may be necessary to repeat tests or do additional testing. Your caregiver  may need to look further for a possible cause. °SEEK IMMEDIATE MEDICAL CARE IF: °· You have pain that is getting worse and is not relieved by medications. °· You develop chest pain that is associated with shortness or breath, sweating, feeling sick to your stomach (nauseous), or throw up (vomit). °· Your pain becomes localized to the abdomen. °· You develop any new symptoms that seem different or that concern you. °MAKE SURE YOU:  °· Understand these instructions. °· Will watch your condition. °· Will get help right away if you are not doing well or get worse. °Document Released: 07/27/2005 Document Revised: 10/19/2011 Document Reviewed: 03/31/2013 °[employer]® Patient Information ©2015 [employer]. This information is not intended to replace advice given to you by your health care provider. Make sure you discuss any questions you have with your health care provider. ° °

## 2015-02-18 ENCOUNTER — Other Ambulatory Visit: Payer: Self-pay

## 2015-02-18 ENCOUNTER — Encounter: Payer: Self-pay | Admitting: Hematology and Oncology

## 2015-02-18 DIAGNOSIS — D509 Iron deficiency anemia, unspecified: Secondary | ICD-10-CM

## 2015-02-18 DIAGNOSIS — E538 Deficiency of other specified B group vitamins: Secondary | ICD-10-CM

## 2015-02-18 NOTE — Progress Notes (Signed)
[facility]-  Cancer Center  Clinic day:  01/11/2015  Chief Complaint: Jody Lee is a 44 y.o. female status post gastric bypass surgery and subsequent iron deficiency and B12 deficiency who is seen for initial assessment.  HPI: The patient notes a lifelong history of anemia.  She underwent gastric bypass surgery in 2003.  She was initially referred to the hematology clinic in 06/2014 for evaluation and treatment of anemia (iron and B12 deficiency).    She is intolerant of oral iron.  She has been treated with weekly Venofer.  She was intiially treated with B12 injections, but was switched to B12 over the counter.  She has a history of multiple sclerosis.  She presented with bilateral peripheral neuropathy.  In 2005, she describes tingling in her feet and numbness extending up to her ribs.  Available labs from 06/27/2014 and 07/19/2014 revealed a hematocrit of 33.0, hemoglobin 10.3, MCV 73, Platelets 224,000, WBC 3200 with AN ANC of 2700.  CMP was normal.  Ferritin was 99.  B12 was 178 (low).  Epo level was 121.4.  Past Medical History  Diagnosis Date  . Hypertension   . Anemia   . MS (multiple sclerosis)   . Vitamin B 12 deficiency   . Vitamin D deficiency     Past Surgical History  Procedure Laterality Date  . Gastric bypass    . Cholecystectomy      Family History  Problem Relation Age of Onset  . Stroke Mother   . Hypertension Father   . Heart disease Father     Social History:  reports that she has never smoked. She does not have any smokeless tobacco history on file. She reports that she does not drink alcohol. Her drug history is not on file.  The patient is alone today.  Allergies:  Allergies  Allergen Reactions  . Nsaids Other (See Comments)    Not supposed to take d/t bariatric surgery   . Ciprofloxacin Rash    Current Medications: Current Outpatient Prescriptions  Medication Sig Dispense Refill  . acetaminophen (TYLENOL) 325 MG  tablet Take 650 mg by mouth every 4 (four) hours as needed.    . calcium-vitamin D (OSCAL WITH D) 500-200 MG-UNIT per tablet Take 1 tablet by mouth.    . Cyanocobalamin (VITAMIN B 12 PO) Take 1 tablet by mouth daily.    . ergocalciferol (VITAMIN D2) 50000 UNITS capsule Take 50,000 Units by mouth once a week.    . ferrous sulfate 325 (65 FE) MG tablet Take 325 mg by mouth 2 (two) times daily with a meal.    . folic acid (FOLVITE) 1 MG tablet Take 2 mg by mouth daily.    Marland Kitchen GILENYA 0.5 MG CAPS TAKE 1 CAPSULE DAILY 84 capsule 0  . hydrochlorothiazide (MICROZIDE) 12.5 MG capsule     . lisinopril (PRINIVIL,ZESTRIL) 40 MG tablet Take 40 mg by mouth daily.    . medroxyPROGESTERone (DEPO-SUBQ PROVERA 104) 104 MG/0.65ML injection Inject 150 mg into the skin every 3 (three) months.    . modafinil (PROVIGIL) 200 MG tablet     . Multiple Vitamin (MULTI-VITAMINS) TABS Take by mouth.    Marland Kitchen UNABLE TO FIND Take 200 mg by mouth daily. Mondafinil 1 tab daily     No current facility-administered medications for this visit.   Facility-Administered Medications Ordered in Other Visits  Medication Dose Route Frequency Provider Last Rate Last Dose  . cyanocobalamin ((VITAMIN B-12)) injection 1,000 mcg  1,000 mcg Intramuscular  Weekly Rosey Bath, MD   1,000 mcg at 02/01/15 1100    Review of Systems:  GENERAL:  Feels good.  Active.  No fevers, sweats or weight loss. PERFORMANCE STATUS (ECOG):  1 HEENT:  No visual changes, runny nose, sore throat, mouth sores or tenderness. Lungs: No shortness of breath or cough.  No hemoptysis. Cardiac:  No chest pain, palpitations, orthopnea, or PND. GI:  No nausea, vomiting, diarrhea, constipation, melena or hematochezia. GU:  No urgency, frequency, dysuria, or hematuria. Musculoskeletal:  Occasional foot cramps.  No muscle tenderness. Extremities:  No pain or swelling. Skin:  No rashes or skin changes. Neuro:  Peripheral neuropathy.  No headache, numbness or weakness,  balance or coordination issues. Endocrine:  No diabetes, thyroid issues, hot flashes or night sweats. Psych:  No mood changes, depression or anxiety. Pain:  No focal pain. Review of systems:  All other systems reviewed and found to be negative.   Physical Exam: Blood pressure 129/84, pulse 76, temperature 98.6 F (37 C), weight 228 lb 13.4 oz (103.8 kg). GENERAL:  Well developed, well nourished, sitting comfortably in the exam room in no acute distress. MENTAL STATUS:  Alert and oriented to person, place and time. HEAD:  Brown hair pulled back.  Normocephalic, atraumatic, face symmetric, no Cushingoid features. EYES:  Glasses.  Pupils equal round and reactive to light and accomodation.  No conjunctivitis or scleral icterus. ENT:  Oropharynx clear without lesion.  Tongue normal. Mucous membranes moist.  RESPIRATORY:  Clear to auscultation without rales, wheezes or rhonchi. CARDIOVASCULAR:  Regular rate and rhythm without murmur, rub or gallop. ABDOMEN:  Soft, non-tender, with active bowel sounds, and no hepatosplenomegaly.  No masses. SKIN:  No rashes, ulcers or lesions. EXTREMITIES: No edema, no skin discoloration or tenderness.  No palpable cords. LYMPH NODES: No palpable cervical, supraclavicular, axillary or inguinal adenopathy  NEUROLOGICAL: Unremarkable. PSYCH:  Appropriate.  No visits with results within 3 Day(s) from this visit. Latest known visit with results is:  Clinical Support on 01/04/2015  Component Date Value Ref Range Status  . WBC 01/04/2015 3.0* 3.6 - 11.0 K/uL Final  . RBC 01/04/2015 4.50  3.80 - 5.20 MIL/uL Final  . Hemoglobin 01/04/2015 13.1  12.0 - 16.0 g/dL Final  . HCT 01/75/1025 39.6  35.0 - 47.0 % Final  . MCV 01/04/2015 88.0  80.0 - 100.0 fL Final  . MCH 01/04/2015 29.1  26.0 - 34.0 pg Final  . MCHC 01/04/2015 33.1  32.0 - 36.0 g/dL Final  . RDW 85/27/7824 14.0  11.5 - 14.5 % Final  . Platelets 01/04/2015 208  150 - 440 K/uL Final  . Vitamin B-12  01/04/2015 177* 180 - 914 pg/mL Final   Comment: (NOTE) This assay is not validated for testing neonatal or myeloproliferative syndrome specimens for Vitamin B12 levels. Performed at [facility]   . Ferritin 01/04/2015 62  11 - 307 ng/mL Final  . Iron 01/04/2015 83  28 - 170 ug/dL Final  . TIBC 23/53/6144 294  250 - 450 ug/dL Final  . Saturation Ratios 01/04/2015 28  10.4 - 31.8 % Final  . UIBC 01/04/2015 211   Final  . Vit D, 25-Hydroxy 01/04/2015 13.6* 30.0 - 100.0 ng/mL Final   Comment: (NOTE) Vitamin D deficiency has been defined by the Institute of Medicine and an Endocrine Society practice guideline as a level of serum 25-OH vitamin D less than 20 ng/mL (1,2). The Endocrine Society went on to further define vitamin D insufficiency  as a level between 21 and 29 ng/mL (2). 1. IOM (Institute of Medicine). 2010. Dietary reference   intakes for calcium and D. [place]: The   [employer]. 2. Holick MF, Binkley [place], Bischoff-Ferrari HA, et al.   Evaluation, treatment, and prevention of vitamin D   deficiency: an Endocrine Society clinical practice   guideline. JCEM. 2011 Jul; 96(7):1911-30. Performed At: [facility] [address] [place], [place] [id number] Mila Homer MD WU:[id number]     Assessment:  Jody Lee is a 44 y.o. female status post gastric bypass surgery (2003) with resultant iron deficiency anemia and B12 deficiency.  She is intolerant of oral iron.  She has received Venofer in the past.  She was initially treated with B12 IM, but has subsequently been on oral B12.  B12 was sub-therapeutic on 06/27/2014.  Symptomatically, she has occasional foot cramps.  She has multiple sclerosis.  Exam is unremarkable.  Hematocrit and MCV are normal.  Iron stores are adequate.  She takes daily folic acid.  B12 is sub-therapeutic.  Plan: 1. Review entire medical history including B12 and iron deficiency and treatment.. 2. Begin B12 weekly  x 6 then every 2-4 weeks in order to achieve a therapeutic level. 3. Labs today:  CBC with diff, iron studies and B12. 4. RTC next week for folate level. 5. RTC in 6 weeks for labs (CBC with diff, ferritin ,B12). 6. RTC in 3 months for MD assess and labs (CBC with diff, ferritin, B12).  Rosey Bath, MD

## 2015-02-22 ENCOUNTER — Inpatient Hospital Stay (HOSPITAL_BASED_OUTPATIENT_CLINIC_OR_DEPARTMENT_OTHER): Payer: 59 | Admitting: Hematology and Oncology

## 2015-02-22 ENCOUNTER — Inpatient Hospital Stay: Payer: 59

## 2015-02-22 VITALS — BP 122/72 | HR 67 | Temp 97.4°F | Ht 64.0 in | Wt 235.7 lb

## 2015-02-22 DIAGNOSIS — D509 Iron deficiency anemia, unspecified: Secondary | ICD-10-CM

## 2015-02-22 DIAGNOSIS — E538 Deficiency of other specified B group vitamins: Secondary | ICD-10-CM

## 2015-02-22 DIAGNOSIS — Z79899 Other long term (current) drug therapy: Secondary | ICD-10-CM | POA: Diagnosis not present

## 2015-02-22 DIAGNOSIS — I10 Essential (primary) hypertension: Secondary | ICD-10-CM

## 2015-02-22 DIAGNOSIS — Z9884 Bariatric surgery status: Secondary | ICD-10-CM

## 2015-02-22 DIAGNOSIS — G35 Multiple sclerosis: Secondary | ICD-10-CM

## 2015-02-22 DIAGNOSIS — E559 Vitamin D deficiency, unspecified: Secondary | ICD-10-CM

## 2015-02-22 LAB — CBC WITH DIFFERENTIAL/PLATELET
Basophils Absolute: 0 10*3/uL (ref 0–0.1)
Basophils Relative: 1 %
Eosinophils Absolute: 0 10*3/uL (ref 0–0.7)
Eosinophils Relative: 0 %
HCT: 37.8 % (ref 35.0–47.0)
Hemoglobin: 12.4 g/dL (ref 12.0–16.0)
Lymphocytes Relative: 5 %
Lymphs Abs: 0.1 10*3/uL — ABNORMAL LOW (ref 1.0–3.6)
MCH: 29.1 pg (ref 26.0–34.0)
MCHC: 32.9 g/dL (ref 32.0–36.0)
MCV: 88.5 fL (ref 80.0–100.0)
Monocytes Absolute: 0.3 10*3/uL (ref 0.2–0.9)
Monocytes Relative: 11 %
Neutro Abs: 2.5 10*3/uL (ref 1.4–6.5)
Neutrophils Relative %: 83 %
Platelets: 214 10*3/uL (ref 150–440)
RBC: 4.27 MIL/uL (ref 3.80–5.20)
RDW: 13.1 % (ref 11.5–14.5)
WBC: 3 10*3/uL — ABNORMAL LOW (ref 3.6–11.0)

## 2015-02-22 LAB — VITAMIN B12: Vitamin B-12: 422 pg/mL (ref 180–914)

## 2015-02-22 LAB — IRON AND TIBC
Iron: 64 ug/dL (ref 28–170)
Saturation Ratios: 24 % (ref 10.4–31.8)
TIBC: 271 ug/dL (ref 250–450)
UIBC: 207 ug/dL

## 2015-02-22 LAB — FERRITIN: Ferritin: 73 ng/mL (ref 11–307)

## 2015-02-22 MED ORDER — CYANOCOBALAMIN 1000 MCG/ML IJ SOLN
1000.0000 ug | INTRAMUSCULAR | Status: AC
  Filled 2015-02-22: qty 1

## 2015-02-22 NOTE — Progress Notes (Signed)
Pt here today for follow up regarding anemia and B 12 injection; pt has h/o MS

## 2015-02-22 NOTE — Progress Notes (Signed)
[facility]-  Cancer Center  Clinic day: 02/22/2015  Chief Complaint: Jody Lee is a 44 y.o. female status post gastric bypass surgery and subsequent iron deficiency and B12 deficiency who is seen for 6 week assessment.  HPI: The patient was last seen in the medical oncology clinic on 01/11/2015.  At that time, she was seen for initial assessment.  She had both iron deficiency and B12 deficiency after gastric bypass.  She has multiple sclerosis presenting with bilateral peripheral neuropathy.  She was intolerant of oral iron.  She had been treated with Venofer in the past.  She was intially treated with B12 injections, but was switched to oral B12.  B12 was low (177) on 01/04/2015.  Folate was normal (12.5) on 01/25/2015.  We discussed initiation of B12 weekly for 6 weeks then every 2-4 weeks depending on her level.  She received B12 weekly x 6 (01/04/2015 - 02/15/2015).    Symptomatically, she has had menstrual spotting over the past week.  She is craving protein.  Past Medical History  Diagnosis Date  . Hypertension   . Anemia   . MS (multiple sclerosis)   . Vitamin B 12 deficiency   . Vitamin D deficiency   . Vision abnormalities     Past Surgical History  Procedure Laterality Date  . Gastric bypass    . Cholecystectomy      Family History  Problem Relation Age of Onset  . Stroke Mother   . Heart disease Mother   . Asthma Mother   . Hypertension Father   . Heart disease Father   . Diabetes Father   . Asthma Brother   . Alcoholism Brother   . Obesity Brother   . Heart disease Brother     Social History:  reports that she has never smoked. She does not have any smokeless tobacco history on file. She reports that she does not drink alcohol. Her drug history is not on file.  The patient is alone today.  Allergies:  Allergies  Allergen Reactions  . Nsaids Other (See Comments)    Not supposed to take d/t bariatric surgery   . Ciprofloxacin Rash     Current Medications: Current Outpatient Prescriptions  Medication Sig Dispense Refill  . acetaminophen (TYLENOL) 325 MG tablet Take 650 mg by mouth every 4 (four) hours as needed.    . folic acid (FOLVITE) 1 MG tablet Take 2 mg by mouth daily.    . hydrochlorothiazide (MICROZIDE) 12.5 MG capsule     . lisinopril (PRINIVIL,ZESTRIL) 40 MG tablet Take 40 mg by mouth daily.    . Multiple Vitamin (MULTI-VITAMINS) TABS Take by mouth.    . calcium-vitamin D (OSCAL WITH D) 500-200 MG-UNIT per tablet Take 1 tablet by mouth.    . Cyanocobalamin (VITAMIN B 12 PO) Take 1 tablet by mouth daily.    . diclofenac sodium (VOLTAREN) 1 % GEL Apply 1 application topically 2 (two) times daily as needed. (Patient not taking: Reported on 05/31/2015) 100 g 1  . ergocalciferol (VITAMIN D2) 50000 UNITS capsule Take 50,000 Units by mouth once a week.    Marland Kitchen GILENYA 0.5 MG CAPS Take 1 capsule (0.5 mg total) by mouth daily. 90 capsule 3  . Magnesium 400 MG TABS Take 1 tablet by mouth at bedtime as needed. (Patient not taking: Reported on 05/31/2015) 30 tablet 0  . MedroxyPROGESTERone Acetate 150 MG/ML SUSY     . modafinil (PROVIGIL) 200 MG tablet Take 1 tablet (200  mg total) by mouth daily. 30 tablet 5   No current facility-administered medications for this visit.    Review of Systems:  GENERAL:  Feels pretty good.  No fevers, sweats or weight loss. PERFORMANCE STATUS (ECOG):  1 HEENT:  No visual changes, runny nose, sore throat, mouth sores or tenderness. Lungs: No shortness of breath or cough.  No hemoptysis. Cardiac:  No chest pain, palpitations, orthopnea, or PND. GI:  No nausea, vomiting, diarrhea, constipation, melena or hematochezia. GU:  Menstrual spotting.  No urgency, frequency, dysuria, or hematuria. Musculoskeletal:  No pain or swelling.  No muscle tenderness. Extremities:  No pain or swelling. Skin:  No rashes or skin changes. Neuro:  Peripheral neuropathy.  No headache, numbness or weakness,  balance or coordination issues. Endocrine:  No diabetes, thyroid issues, hot flashes or night sweats. Psych:  No mood changes, depression or anxiety. Pain:  No focal pain. Review of systems:  All other systems reviewed and found to be negative.   Physical Exam: Blood pressure 122/72, pulse 67, temperature 97.4 F (36.3 C), temperature source Tympanic, height 5\' 4"  (1.626 m), weight 235 lb 10.8 oz (106.901 kg). GENERAL:  Well developed, well nourished, sitting comfortably in the exam room in no acute distress. MENTAL STATUS:  Alert and oriented to person, place and time. HEAD:  Brown hair.  Normocephalic, atraumatic, face symmetric, no Cushingoid features. EYES:  Glasses.  Blue eyes.  Pupils equal round and reactive to light and accomodation.  No conjunctivitis or scleral icterus. ENT:  Oropharynx clear without lesion.  Tongue normal. Mucous membranes moist.  RESPIRATORY:  Clear to auscultation without rales, wheezes or rhonchi. CARDIOVASCULAR:  Regular rate and rhythm without murmur, rub or gallop. ABDOMEN:  Soft, non-tender, with active bowel sounds, and no hepatosplenomegaly.  No masses. SKIN:  No rashes, ulcers or lesions. EXTREMITIES: No edema, no skin discoloration or tenderness.  No palpable cords. LYMPH NODES: No palpable cervical, supraclavicular, axillary or inguinal adenopathy  NEUROLOGICAL: Unremarkable. PSYCH:  Appropriate.  Appointment on 02/22/2015  Component Date Value Ref Range Status  . WBC 02/22/2015 3.0* 3.6 - 11.0 K/uL Final  . RBC 02/22/2015 4.27  3.80 - 5.20 MIL/uL Final  . Hemoglobin 02/22/2015 12.4  12.0 - 16.0 g/dL Final  . HCT 70/96/2836 37.8  35.0 - 47.0 % Final  . MCV 02/22/2015 88.5  80.0 - 100.0 fL Final  . MCH 02/22/2015 29.1  26.0 - 34.0 pg Final  . MCHC 02/22/2015 32.9  32.0 - 36.0 g/dL Final  . RDW 62/94/7654 13.1  11.5 - 14.5 % Final  . Platelets 02/22/2015 214  150 - 440 K/uL Final  . Neutrophils Relative % 02/22/2015 83   Final  . Neutro Abs  02/22/2015 2.5  1.4 - 6.5 K/uL Final  . Lymphocytes Relative 02/22/2015 5   Final  . Lymphs Abs 02/22/2015 0.1* 1.0 - 3.6 K/uL Final  . Monocytes Relative 02/22/2015 11   Final  . Monocytes Absolute 02/22/2015 0.3  0.2 - 0.9 K/uL Final  . Eosinophils Relative 02/22/2015 0   Final  . Eosinophils Absolute 02/22/2015 0.0  0 - 0.7 K/uL Final  . Basophils Relative 02/22/2015 1   Final  . Basophils Absolute 02/22/2015 0.0  0 - 0.1 K/uL Final  . Ferritin 02/22/2015 73  11 - 307 ng/mL Final  . Iron 02/22/2015 64  28 - 170 ug/dL Final  . TIBC 65/10/5463 271  250 - 450 ug/dL Final  . Saturation Ratios 02/22/2015 24  10.4 - 31.8 %  Final  . UIBC 02/22/2015 207   Final  . Vitamin B-12 02/22/2015 422  180 - 914 pg/mL Final   Comment: (NOTE) This assay is not validated for testing neonatal or myeloproliferative syndrome specimens for Vitamin B12 levels. Performed at [facility]   . Vit D, 25-Hydroxy 02/22/2015 12.0* 30.0 - 100.0 ng/mL Final   Comment: (NOTE) Vitamin D deficiency has been defined by the Institute of Medicine and an Endocrine Society practice guideline as a level of serum 25-OH vitamin D less than 20 ng/mL (1,2). The Endocrine Society went on to further define vitamin D insufficiency as a level between 21 and 29 ng/mL (2). 1. IOM (Institute of Medicine). 2010. Dietary reference   intakes for calcium and D. [place]: The   [employer]. 2. Holick MF, Binkley [place], Bischoff-Ferrari HA, et al.   Evaluation, treatment, and prevention of vitamin D   deficiency: an Endocrine Society clinical practice   guideline. JCEM. 2011 Jul; 96(7):1911-30. Performed At: [facility] [address] [place], [place] [id number] Mila Homer MD WU:[id number]     Assessment:  AZARAH DACY is a 44 y.o. female with multiple sclerosis status post gastric bypass surgery (2003) with resultant iron deficiency anemia and B12 deficiency.  She is intolerant of oral iron.   She has received Venofer in the past.  She was initially treated with B12 IM, but has subsequently been on oral B12.  B12 was sub-therapeutic on 06/27/2014.  She received B12 weekly x 6 (01/04/2015 - 02/15/2015). Folate was normal (12.5) on 01/25/2015.  She takes daily folic acid.  Symptomatically, she has has had some menstrual spotting.  She is craving protein.  Exam is unremarkable.  Hematocrit and MCV are normal.  Iron stores are adequate (ferritin 73).  Plan: 1. Labs today:  CBC with diff, ferritin, iron studies, vitamin B12 level. 2. Olegario Messier to call patient with B12 level. 3. B12 today. 4. RTC in 1 month for labs (CBC with diff, ferritin, B12 level) and B12 injection. 5. RTC in 3 months for MD assess, labs (CBC with diff, ferritin, B12 level), and B12 +/- Venofer.   Rosey Bath, MD  02/22/2015

## 2015-02-23 LAB — VITAMIN D 25 HYDROXY (VIT D DEFICIENCY, FRACTURES): Vit D, 25-Hydroxy: 12 ng/mL — ABNORMAL LOW (ref 30.0–100.0)

## 2015-03-07 ENCOUNTER — Ambulatory Visit (INDEPENDENT_AMBULATORY_CARE_PROVIDER_SITE_OTHER): Payer: 59 | Admitting: Family Medicine

## 2015-03-07 ENCOUNTER — Encounter: Payer: Self-pay | Admitting: Family Medicine

## 2015-03-07 ENCOUNTER — Ambulatory Visit
Admission: RE | Admit: 2015-03-07 | Discharge: 2015-03-07 | Disposition: A | Discharge status: Home / Self Care | Payer: 59 | Source: Ambulatory Visit | Attending: Family Medicine | Admitting: Family Medicine

## 2015-03-07 VITALS — BP 116/80 | HR 64 | Temp 97.7°F | Resp 16 | Ht 64.0 in | Wt 234.8 lb

## 2015-03-07 DIAGNOSIS — M79661 Pain in right lower leg: Secondary | ICD-10-CM

## 2015-03-07 DIAGNOSIS — M7989 Other specified soft tissue disorders: Secondary | ICD-10-CM | POA: Diagnosis not present

## 2015-03-07 DIAGNOSIS — R208 Other disturbances of skin sensation: Secondary | ICD-10-CM | POA: Diagnosis not present

## 2015-03-07 DIAGNOSIS — R200 Anesthesia of skin: Secondary | ICD-10-CM

## 2015-03-07 LAB — COMPREHENSIVE METABOLIC PANEL
ALBUMIN: 4 g/dL (ref 3.5–5.0)
ALK PHOS: 70 U/L (ref 38–126)
ALT: 23 U/L (ref 14–54)
AST: 20 U/L (ref 15–41)
Anion gap: 7 (ref 5–15)
BILIRUBIN TOTAL: 0.7 mg/dL (ref 0.3–1.2)
BUN: 17 mg/dL (ref 6–20)
CALCIUM: 9 mg/dL (ref 8.9–10.3)
CO2: 25 mmol/L (ref 22–32)
Chloride: 107 mmol/L (ref 101–111)
Creatinine, Ser: 1.11 mg/dL — ABNORMAL HIGH (ref 0.44–1.00)
GFR calc non Af Amer: 60 mL/min — ABNORMAL LOW (ref >60)
GLUCOSE: 95 mg/dL (ref 65–99)
Potassium: 4.1 mmol/L (ref 3.5–5.1)
Sodium: 139 mmol/L (ref 135–145)
Total Protein: 6.5 g/dL (ref 6.5–8.1)

## 2015-03-07 LAB — MAGNESIUM: Magnesium: 1.9 mg/dL (ref 1.7–2.4)

## 2015-03-07 LAB — TSH: TSH: 1.887 u[IU]/mL (ref 0.350–4.500)

## 2015-03-07 MED ORDER — MAGNESIUM 400 MG PO TABS: 1.0000 | ORAL_TABLET | Freq: Every evening | ORAL | Status: DC | PRN

## 2015-03-07 MED ORDER — DICLOFENAC SODIUM 1 % TD GEL: 1.0000 "application " | Freq: Two times a day (BID) | TRANSDERMAL | Status: DC | PRN

## 2015-03-07 NOTE — Patient Instructions (Signed)
Plan: We will rule out a blood clot in the RLE. You can put voltaren gel on your sore spots on the legs since you cannot take oral NSAIDs because of your surgery.   Also try magnesium 400mg  at bedtime. We will check some labs to make sure no other lab issues are the source of your symptoms.  Elevate your legs and where compression socks during the day.  If you notice shortness of breath, increasing redness, swelling, or pain in the RLE- please seek immediate medical attention.

## 2015-03-07 NOTE — Progress Notes (Signed)
Subjective:    Patient ID: Jody Lee, female    DOB: 1971-03-06, 44 y.o.   MRN: [id number]  HPI: Jody Lee is a 45 y.o. female presenting on 03/07/2015 for Leg Pain   HPI  Pt presents to pain and swelling in the RLE. Pt reports her legs swell daily. Pain started on Sunday night. Pain is described as cramping and throbbing. No redness to the RLE. Pain is located in R lower calf. Swelling R>L. PaIn increases with swelling. No fevers or chills.   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 H/o of bariatric surgery.    Past Medical History  Diagnosis Date  . Hypertension   . Anemia   . MS (multiple sclerosis)   . Vitamin B 12 deficiency   . Vitamin D deficiency     Current Outpatient Prescriptions on File Prior to Visit  Medication Sig  . acetaminophen (TYLENOL) 325 MG tablet Take 650 mg by mouth every 4 (four) hours as needed.  . calcium-vitamin D (OSCAL WITH D) 500-200 MG-UNIT per tablet Take 1 tablet by mouth.  . folic acid (FOLVITE) 1 MG tablet Take 2 mg by mouth daily.  Marland Kitchen GILENYA 0.5 MG CAPS TAKE 1 CAPSULE DAILY  . hydrochlorothiazide (MICROZIDE) 12.5 MG capsule   . lisinopril (PRINIVIL,ZESTRIL) 40 MG tablet Take 40 mg by mouth daily.  . medroxyPROGESTERone (DEPO-SUBQ PROVERA 104) 104 MG/0.65ML injection Inject 150 mg into the skin every 3 (three) months.  . Multiple Vitamin (MULTI-VITAMINS) TABS Take by mouth.  . Cyanocobalamin (VITAMIN B 12 PO) Take 1 tablet by mouth daily.  . ergocalciferol (VITAMIN D2) 50000 UNITS capsule Take 50,000 Units by mouth once a week.  . ferrous sulfate 325 (65 FE) MG tablet Take 325 mg by mouth 2 (two) times daily with a meal.  . modafinil (PROVIGIL) 200 MG tablet   . UNABLE TO FIND Take 200 mg by mouth daily. Mondafinil 1 tab daily   Current Facility-Administered Medications on File Prior to Visit  Medication  . cyanocobalamin ((VITAMIN B-12)) injection 1,000 mcg  . cyanocobalamin ((VITAMIN B-12)) injection 1,000 mcg     Review of Systems  Constitutional: Negative for fever and chills.  HENT: Negative.   Respiratory: Negative for chest tightness, shortness of breath and wheezing.   Cardiovascular: Positive for leg swelling. Negative for chest pain.  Gastrointestinal: Negative.   Endocrine: Negative.   Genitourinary: Negative.   Skin: Negative for color change and rash.  Neurological: Positive for numbness (occasional from knee to foot on RLE. ). Negative for dizziness, facial asymmetry and headaches.  Hematological: Negative.   Psychiatric/Behavioral: Negative.    Per HPI unless specifically indicated above     Objective:    BP 116/80 mmHg  Pulse 64  Temp(Src) 97.7 F (36.5 C) (Oral)  Resp 16  Ht  (1.626 m)  Wt 234 lb 12.8 oz (106.505 kg)  BMI 40.28 kg/m2  LMP   Wt Readings from Last 3 Encounters:  03/07/15 234 lb 12.8 oz (106.505 kg)  02/22/15 235 lb 10.8 oz (106.901 kg)  02/15/15 230 lb (104.327 kg)    Physical Exam  Constitutional: She appears well-developed and well-nourished. No distress.  Neck: Normal range of motion. Neck supple.  Cardiovascular: Normal rate, regular rhythm, S1 normal, S2 normal and normal pulses.  Exam reveals no gallop and no friction rub.   No murmur heard. Pulmonary/Chest: Breath sounds normal. No respiratory distress.  Musculoskeletal:  Right lower leg: She exhibits edema (non pitting. ). She exhibits no tenderness.       Legs: Lymphadenopathy:    She has no cervical adenopathy.  Skin: Skin is warm and dry. No rash noted. She is not diaphoretic. No erythema.   Results for orders placed or performed in visit on 02/22/15  CBC with Differential  Result Value Ref Range   WBC 3.0 (L) 3.6 - 11.0 K/uL   RBC 4.27 3.80 - 5.20 MIL/uL   Hemoglobin 12.4 12.0 - 16.0 g/dL   HCT 00.3 70.4 - 88.8 %   MCV 88.5 80.0 - 100.0 fL   MCH 29.1 26.0 - 34.0 pg   MCHC 32.9 32.0 - 36.0 g/dL   RDW 91.6 94.5 - 03.8 %   Platelets 214 150 - 440 K/uL   Neutrophils  Relative % 83 %   Neutro Abs 2.5 1.4 - 6.5 K/uL   Lymphocytes Relative 5 %   Lymphs Abs 0.1 (L) 1.0 - 3.6 K/uL   Monocytes Relative 11 %   Monocytes Absolute 0.3 0.2 - 0.9 K/uL   Eosinophils Relative 0 %   Eosinophils Absolute 0.0 0 - 0.7 K/uL   Basophils Relative 1 %   Basophils Absolute 0.0 0 - 0.1 K/uL  Ferritin  Result Value Ref Range   Ferritin 73 11 - 307 ng/mL  Iron and TIBC  Result Value Ref Range   Iron 64 28 - 170 ug/dL   TIBC 882 800 - 349 ug/dL   Saturation Ratios 24 10.4 - 31.8 %   UIBC 207 ug/dL  Vitamin Z79  Result Value Ref Range   Vitamin B-12 422 180 - 914 pg/mL  Vitamin D 25 hydroxy  Result Value Ref Range   Vit D, 25-Hydroxy 12.0 (L) 30.0 - 100.0 ng/mL      Assessment & Plan:   Problem List Items Addressed This Visit    None    Visit Diagnoses    Pain and swelling of lower leg, right    -  Primary    R/o DVT with stat ultrasound. CMP to check for electrolyte abnormalities. Cramping vs muscle strain. Topical NSAIDs and PRN tylenol. RTC if symptoms not improved. Compression stockings and elevation.     Relevant Medications    diclofenac sodium (VOLTAREN) 1 % GEL    Magnesium 400 MG TABS    Other Relevant Orders    US Venous Img Lower Unilateral Right    Magnesium    Comprehensive Metabolic Panel (CMET)    TSH    Numbness in right leg        B12 deficiency vs. MS sequela. Pt will f/u with hematology regarding next B12 injection. Encouraged to mention to neurology.         Meds ordered this encounter  Medications  . diclofenac sodium (VOLTAREN) 1 % GEL    Sig: Apply 1 application topically 2 (two) times daily as needed.    Dispense:  100 g    Refill:  1    Order Specific Question:  Supervising Provider    Answer:  Janeann Forehand [[id number]]  . Magnesium 400 MG TABS    Sig: Take 1 tablet by mouth at bedtime as needed.    Dispense:  30 tablet    Refill:  0    Order Specific Question:  Supervising Provider    Answer:  Janeann Forehand  [[id number]]      Follow up plan: Return if symptoms worsen or  fail to improve.

## 2015-03-08 ENCOUNTER — Telehealth: Payer: Self-pay | Admitting: Family Medicine

## 2015-03-08 NOTE — Telephone Encounter (Signed)
Called to review lab results. All labs were basically normal. Her Cr was mildly elevated- likely due to dehydration at the time of the exam. Dehydration may be contributing to symptoms. Encouraged patient to stick to plan we developed yesterday.

## 2015-03-13 ENCOUNTER — Telehealth: Payer: Self-pay | Admitting: Family Medicine

## 2015-03-13 NOTE — Telephone Encounter (Signed)
Called pt regarding PA needed for her diclofenac. She has not picked it up yet. She would like [place] to complete PA. If it is denied we have discussed using capsaicin cream OTC.

## 2015-03-15 ENCOUNTER — Other Ambulatory Visit: Payer: Self-pay

## 2015-03-15 NOTE — Telephone Encounter (Signed)
It got approved and approval number is [id number] as if 02/23/2015 to 03/14/2016 they will mail [place] and a pt letter and [facility] for pt . Thanks [employer]

## 2015-03-18 ENCOUNTER — Telehealth: Payer: Self-pay | Admitting: Neurology

## 2015-03-18 NOTE — Telephone Encounter (Signed)
Sherie Don [facility] [phone] called regarding GILENYA 0.5 MG CAPS, they do not have prescription on file for this medication

## 2015-03-18 NOTE — Telephone Encounter (Signed)
I have spoken with [facility] and advised that Denene has not been seen at [facility],  She does have an appt. for 03-28-15.  I attempted to call Joyceann to offer a sooner appt, but vm was full, so I was unable to leave a message.  If she has not been off of her Gilenya, RAS may approved for office samples to be given until she can be seen./fim

## 2015-03-20 ENCOUNTER — Encounter: Payer: Self-pay | Admitting: *Deleted

## 2015-03-20 NOTE — Telephone Encounter (Signed)
Unable to contact letter sent asking Jody Lee to call me asap regarding her Gilenya.  She is a former pt. of Dr. Bonnita Hollow from [facility].  She has not been seen here at [facility] yet.  He needs to see her before Gilenya can be sent in.  I am happy to give her an appt. and provide samples or rx. to get her thru to the appt.  I do need to confirm that she has been consistently taking Gilenya with no lapse in treatment before giving samples or rx.  If she has been off of Gilenya for an extended time, she would need to repeat fdo prior to restarting Gilenya/fim

## 2015-03-21 NOTE — Telephone Encounter (Signed)
April / [facility] [phone] opt 1, called  regarding GILENYA 0.5 MG CAPS prescription

## 2015-03-21 NOTE — Telephone Encounter (Signed)
I have spoken with Jody Lee, pharmacist at [facility], and advised Jody Lee is not a current pt. of Jody Lee at [facility].  I have attempted twice to reach out to Jody Lee to schedule an appt. with Jody Lee if that is what she would like, but her vm has been full and I am not able to leave a message. I have mailed her an unable to contact letter requesting she call regarding her Jody Lee--but this was just mailed yesterday, so I have not heard back from  her.   I can't even confirm that she is currently taking Jody Lee as rx'd, so am unable to give a new rx. without at least speaking with [place] and sched. an appt.  He verbalized understanding of same/fim

## 2015-03-25 ENCOUNTER — Encounter: Payer: Self-pay | Admitting: Neurology

## 2015-03-25 ENCOUNTER — Inpatient Hospital Stay: Payer: 59

## 2015-03-25 ENCOUNTER — Inpatient Hospital Stay: Payer: 59 | Attending: Hematology and Oncology

## 2015-03-25 ENCOUNTER — Telehealth: Payer: Self-pay | Admitting: *Deleted

## 2015-03-25 ENCOUNTER — Ambulatory Visit (INDEPENDENT_AMBULATORY_CARE_PROVIDER_SITE_OTHER): Payer: 59 | Admitting: Neurology

## 2015-03-25 VITALS — BP 106/70 | HR 64 | Resp 16 | Ht 64.0 in | Wt 239.0 lb

## 2015-03-25 DIAGNOSIS — E538 Deficiency of other specified B group vitamins: Secondary | ICD-10-CM | POA: Diagnosis not present

## 2015-03-25 DIAGNOSIS — R269 Unspecified abnormalities of gait and mobility: Secondary | ICD-10-CM | POA: Diagnosis not present

## 2015-03-25 DIAGNOSIS — R5383 Other fatigue: Secondary | ICD-10-CM | POA: Diagnosis not present

## 2015-03-25 DIAGNOSIS — D509 Iron deficiency anemia, unspecified: Secondary | ICD-10-CM

## 2015-03-25 DIAGNOSIS — G35 Multiple sclerosis: Secondary | ICD-10-CM

## 2015-03-25 DIAGNOSIS — Z79899 Other long term (current) drug therapy: Secondary | ICD-10-CM | POA: Diagnosis not present

## 2015-03-25 LAB — CBC WITH DIFFERENTIAL/PLATELET
Basophils Absolute: 0 10*3/uL (ref 0–0.1)
Basophils Relative: 0 %
Eosinophils Absolute: 0 10*3/uL (ref 0–0.7)
Eosinophils Relative: 0 %
HCT: 38.3 % (ref 35.0–47.0)
Hemoglobin: 12.6 g/dL (ref 12.0–16.0)
Lymphocytes Relative: 5 %
Lymphs Abs: 0.2 10*3/uL — ABNORMAL LOW (ref 1.0–3.6)
MCH: 29 pg (ref 26.0–34.0)
MCHC: 32.8 g/dL (ref 32.0–36.0)
MCV: 88.4 fL (ref 80.0–100.0)
Monocytes Absolute: 0.4 10*3/uL (ref 0.2–0.9)
Monocytes Relative: 12 %
Neutro Abs: 3.1 10*3/uL (ref 1.4–6.5)
Neutrophils Relative %: 83 %
Platelets: 204 10*3/uL (ref 150–440)
RBC: 4.33 MIL/uL (ref 3.80–5.20)
RDW: 13.8 % (ref 11.5–14.5)
WBC: 3.7 10*3/uL (ref 3.6–11.0)

## 2015-03-25 LAB — VITAMIN B12: Vitamin B-12: 274 pg/mL (ref 180–914)

## 2015-03-25 LAB — FERRITIN: Ferritin: 40 ng/mL (ref 11–307)

## 2015-03-25 MED ORDER — CYANOCOBALAMIN 1000 MCG/ML IJ SOLN
1000.0000 ug | Freq: Once | INTRAMUSCULAR | Status: AC
  Administered 2015-03-25: 1000 ug via INTRAMUSCULAR
  Filled 2015-03-25: qty 1

## 2015-03-25 MED ORDER — MODAFINIL 200 MG PO TABS: 200.0000 mg | ORAL_TABLET | Freq: Every day | ORAL | Status: DC

## 2015-03-25 NOTE — Telephone Encounter (Signed)
I have spoken with Jody Lee this am--she will come in this afternoon to see Dr. Lenice Pressman

## 2015-03-25 NOTE — Telephone Encounter (Signed)
Patient return phone.

## 2015-03-25 NOTE — Progress Notes (Signed)
[facility]  PATIENT: BAILYN SPACKMAN DOB: 11-20-1970  REFERRING DOCTOR OR PCP:  Venora Maples SOURCE: patient and labs, notes in EMR and MRI on PACS  _________________________________   HISTORICAL  CHIEF COMPLAINT:  Chief Complaint  Patient presents with  . Multiple Sclerosis    Former pt. of Dr. Bonnita Hollow from [facility].  Sts. she continues to tolerate Gilenya well.  She needs a new rx. sent to [facility].  She was dx. with MS in October 2005.  Presenting sx. were le pain/numbness, numbness up to her sternum.  She sts. mri brain and spine showed lesions.  Sts. she believes LP showed oligoclonal bands.  Sts. she was initially on Betaseron, but after several yrs. she developed antibodies to it.  She then started Gilenya, which she continues today.  She reports increasing   . Fatigue    fatigue (since running out of Provigil) and requests rx. for this also/fim    HISTORY OF PRESENT ILLNESS:  Oliviya Gilkison is a 44 year old woman who was diagnosed with MS in 2005 after presenting with a spinal cord syndrome. She started with numbness and pain in the feet that worked its way up to her abdomen over a couple days.   Initially, she had an MRI of the spine that showed a plaque consistent with her symptoms. She was referred to Dr. Maple Hudson. He did a lumbar puncture and ordered an MRI of the brain. Both were consistent with multiple sclerosis and I started to see her. Initially, she was placed on Betaseron and did very well for the first few years. Then around 2011 or 2012, she had an exacerbation and testing showed that she had developed antibodies against Betaseron. Therefore, she was switched to Gilenya at that time. She has been on Gilenya for the last 4+ years and has done well. Specifically, she has not reported any difficulties with exacerbations and she tolerates the medication well.   Her last MRI was performed at [facility] November 2015.   I  personally reviewed the MRI of the brain from 03/02/2013 performed a [facility] showing typical periventricular T2/FLAIR hyperintense foci, predominantly in the periventricular white matter.   There were no acute foci.  Gait/strength/sensation:  She generally feels that her gait is doing well. However, when the temperature is hotter or when she is more tired she will stumble a little bit. She denies any major problem with weakness or numbness. However, she will occasionally note some issues with her hands with both mild weakness and numbness.  Bladder: She denies any difficulty with urinary frequency or urgency. She does not have incontinence.  Vision: She denies any MS related visual problems.  Fatigue/sleep: Her bigger problems is that she has difficulty with both physical and cognitive fatigue. She has noted a benefit with Provigil. She ran out a couple months ago and has relied on caffeine with less benefit. She has some difficulty with her sleep but once she falls asleep she usually will have more solid sleep. Some nights she only gets 3 or 4 hours of sleep. Other nights when she has more difficulty sleeping, she will then we will have done 1-2 hours of work in the late evening and then not go to bed to after midnight. Sometimes she has difficulty turning her mind off.  Mood/cognition: She denies any difficulty with depression or anxiety at this time. She notes some cognitive difficulty. This is mostly problems with focusing and bad is improved with Provigil. However, sometimes  she has difficulty with recall, even if she is not feeling tired.  Other: Last year, she required several iron infusions due to iron deficiency anemia.       REVIEW OF SYSTEMS: Constitutional: No fevers, chills, sweats, or change in appetite.   She notes fatigue. Eyes: No visual changes, double vision, eye pain Ear, nose and throat: No hearing loss, ear pain, nasal congestion, sore throat Cardiovascular: No chest  pain, palpitations Respiratory: No shortness of breath at rest or with exertion.   No wheezes GastrointestinaI: No nausea, vomiting, diarrhea, abdominal pain, fecal incontinence Genitourinary: No dysuria, urinary retention or frequency.  No nocturia. Musculoskeletal: No neck pain, back pain Integumentary: No rash, pruritus, skin lesions Neurological: as above Psychiatric: No depression at this time.  No anxiety Endocrine: No palpitations, diaphoresis, change in appetite, change in weigh or increased thirst Hematologic/Lymphatic: No anemia, purpura, petechiae. Allergic/Immunologic: No itchy/runny eyes, nasal congestion, recent allergic reactions, rashes  ALLERGIES: Allergies  Allergen Reactions  . Nsaids Other (See Comments)    Not supposed to take d/t bariatric surgery   . Ciprofloxacin Rash    HOME MEDICATIONS:  Current outpatient prescriptions:  .  acetaminophen (TYLENOL) 325 MG tablet, Take 650 mg by mouth every 4 (four) hours as needed., Disp: , Rfl:  .  calcium-vitamin D (OSCAL WITH D) 500-200 MG-UNIT per tablet, Take 1 tablet by mouth., Disp: , Rfl:  .  Cyanocobalamin (VITAMIN B 12 PO), Take 1 tablet by mouth daily., Disp: , Rfl:  .  diclofenac sodium (VOLTAREN) 1 % GEL, Apply 1 application topically 2 (two) times daily as needed., Disp: 100 g, Rfl: 1 .  ergocalciferol (VITAMIN D2) 50000 UNITS capsule, Take 50,000 Units by mouth once a week., Disp: , Rfl:  .  ferrous sulfate 325 (65 FE) MG tablet, Take 325 mg by mouth 2 (two) times daily with a meal., Disp: , Rfl:  .  folic acid (FOLVITE) 1 MG tablet, Take 2 mg by mouth daily., Disp: , Rfl:  .  GILENYA 0.5 MG CAPS, TAKE 1 CAPSULE DAILY, Disp: 84 capsule, Rfl: 0 .  hydrochlorothiazide (MICROZIDE) 12.5 MG capsule, , Disp: , Rfl:  .  lisinopril (PRINIVIL,ZESTRIL) 40 MG tablet, Take 40 mg by mouth daily., Disp: , Rfl:  .  Magnesium 400 MG TABS, Take 1 tablet by mouth at bedtime as needed., Disp: 30 tablet, Rfl: 0 .   medroxyPROGESTERone (DEPO-SUBQ PROVERA 104) 104 MG/0.65ML injection, Inject 150 mg into the skin every 3 (three) months., Disp: , Rfl:  .  modafinil (PROVIGIL) 200 MG tablet, , Disp: , Rfl:  .  Multiple Vitamin (MULTI-VITAMINS) TABS, Take by mouth., Disp: , Rfl:  No current facility-administered medications for this visit.  Facility-Administered Medications Ordered in Other Visits:  .  cyanocobalamin ((VITAMIN B-12)) injection 1,000 mcg, 1,000 mcg, Intramuscular, Weekly, Rosey Bath, MD, 1,000 mcg at 02/22/15 1614  PAST MEDICAL HISTORY: Past Medical History  Diagnosis Date  . Hypertension   . Anemia   . MS (multiple sclerosis)   . Vitamin B 12 deficiency   . Vitamin D deficiency   . Vision abnormalities     PAST SURGICAL HISTORY: Past Surgical History  Procedure Laterality Date  . Gastric bypass    . Cholecystectomy      FAMILY HISTORY: Family History  Problem Relation Age of Onset  . Stroke Mother   . Heart disease Mother   . Asthma Mother   . Hypertension Father   . Heart disease Father   .  Diabetes Father   . Asthma Brother   . Alcoholism Brother   . Obesity Brother   . Heart disease Brother     SOCIAL HISTORY:  Social History   Social History  . Marital Status: Single    Spouse Name: N/A  . Number of Children: N/A  . Years of Education: N/A   Occupational History  . Not on file.   Social History Main Topics  . Smoking status: Never Smoker   . Smokeless tobacco: Not on file  . Alcohol Use: No  . Drug Use: Not on file  . Sexual Activity: Not on file   Other Topics Concern  . Not on file   Social History Narrative     PHYSICAL EXAM  Filed Vitals:   03/25/15 1555  BP: 106/70  Pulse: 64  Resp: 16  Height: 5\' 4"  (1.626 m)  Weight: 239 lb (108.41 kg)    Body mass index is 41 kg/(m^2).   General: The patient is well-developed and well-nourished and in no acute distress  Eyes:  Funduscopic exam shows normal optic discs and retinal  vessels.  Neck: The neck is supple, no carotid bruits are noted.  The neck is nontender.  Cardiovascular: The heart has a regular rate and rhythm with a normal S1 and S2. There were no murmurs, gallops or rubs.   Skin: Extremities are without significant edema.  Musculoskeletal:  Back is nontender  Neurologic Exam  Mental status: The patient is alert and oriented x 3 at the time of the examination. The patient has apparent normal recent and remote memory, with an apparently normal attention span and concentration ability.   Speech is normal.  Cranial nerves: Extraocular movements are full. Pupils are equal, round, and reactive to light and accomodation. Color vision is symmetric.  Facial symmetry is present. There is good facial sensation to soft touch bilaterally.Facial strength is normal.  Trapezius and sternocleidomastoid strength is normal. No dysarthria is noted.  The tongue is midline, and the patient has symmetric elevation of the soft palate. No obvious hearing deficits are noted.  Motor:  Muscle bulk is normal.   Tone is normal. Strength is  5 / 5 in all 4 extremities.   Sensory: Sensory testing is intact to touch and vibration sensation in all 4 extremities.  Coordination: Cerebellar testing reveals good finger-nose-finger and heel-to-shin bilaterally.  Gait and station: Station is normal.   Gait is mildly wide.   Tandem gait is wide. Romberg is negative.   Reflexes: Deep tendon reflexes are symmetric and normal bilaterally.        DIAGNOSTIC DATA (LABS, IMAGING, TESTING) - I reviewed patient records, labs, notes, testing and imaging myself where available.  Lab Results  Component Value Date   WBC 3.7 03/25/2015   HGB 12.6 03/25/2015   HCT 38.3 03/25/2015   MCV 88.4 03/25/2015   PLT 204 03/25/2015      Component Value Date/Time   NA 139 03/07/2015 1045   NA 139 06/27/2014 1356   K 4.1 03/07/2015 1045   K 3.9 06/27/2014 1356   CL 107 03/07/2015 1045   CL 106  06/27/2014 1356   CO2 25 03/07/2015 1045   CO2 28 06/27/2014 1356   GLUCOSE 95 03/07/2015 1045   GLUCOSE 89 06/27/2014 1356   BUN 17 03/07/2015 1045   BUN 7 06/27/2014 1356   CREATININE 1.11* 03/07/2015 1045   CREATININE 0.99 06/27/2014 1356   CALCIUM 9.0 03/07/2015 1045   CALCIUM 8.3* 06/27/2014  1356   PROT 6.5 03/07/2015 1045   PROT 6.9 06/27/2014 1356   ALBUMIN 4.0 03/07/2015 1045   ALBUMIN 3.8 06/27/2014 1356   AST 20 03/07/2015 1045   AST 15 06/27/2014 1356   ALT 23 03/07/2015 1045   ALT 20 06/27/2014 1356   ALKPHOS 70 03/07/2015 1045   ALKPHOS 105 06/27/2014 1356   BILITOT 0.7 03/07/2015 1045   BILITOT 0.4 06/27/2014 1356   GFRNONAA 60* 03/07/2015 1045   GFRNONAA >60 06/27/2014 1356   GFRNONAA >60 12/26/2013 1031   GFRAA >60 03/07/2015 1045   GFRAA >60 06/27/2014 1356   GFRAA >60 12/26/2013 1031    Lab Results  Component Value Date   VITAMINB12 422 02/22/2015   Lab Results  Component Value Date   TSH 1.887 03/07/2015       ASSESSMENT AND PLAN  Multiple sclerosis  Other fatigue  Gait disturbance  1.   Continue Gilenya.  [facility] was contacted to ensure that she does not have a gap in her treatment. 2.   Continue Provigil for fatigue and sleepiness 3,   she will return in 6 months or sooner if there are new or worsening neurologic symptoms.  30 minute face-to-face evaluation with greater than one half of the time counseling and coordinating care about her MS and related symptoms.    Piya Mesch A. Epimenio Foot, MD, PhD 03/25/2015, 4:08 PM Certified in Neurology, Clinical Neurophysiology, Sleep Medicine, Pain Medicine and Neuroimaging  [facility] [address] [place], [place] [address] [phone]

## 2015-03-26 ENCOUNTER — Telehealth: Payer: Self-pay | Admitting: *Deleted

## 2015-03-26 DIAGNOSIS — R5383 Other fatigue: Secondary | ICD-10-CM | POA: Insufficient documentation

## 2015-03-26 DIAGNOSIS — G35 Multiple sclerosis: Secondary | ICD-10-CM | POA: Insufficient documentation

## 2015-03-26 DIAGNOSIS — R269 Unspecified abnormalities of gait and mobility: Secondary | ICD-10-CM | POA: Insufficient documentation

## 2015-03-26 NOTE — Telephone Encounter (Signed)
Release fax to [facility] on 03/26/15 requesting MRI.

## 2015-03-28 ENCOUNTER — Ambulatory Visit: Payer: 59 | Admitting: Neurology

## 2015-03-29 ENCOUNTER — Telehealth: Payer: Self-pay | Admitting: Neurology

## 2015-03-29 MED ORDER — GILENYA 0.5 MG PO CAPS: 1.0000 | ORAL_CAPSULE | Freq: Every day | ORAL | Status: DC

## 2015-03-29 NOTE — Telephone Encounter (Signed)
Rx has been sent.  Receipt confirmed by pharmacy.  I called the patient back to advise.  Got no answer.  Left message.  

## 2015-03-29 NOTE — Telephone Encounter (Signed)
Pt will need rx GILENYA 0.5 MG CAPS sent in as a 3 month supply at a time and will need refills with it as well. May call pt if questions. [phone]

## 2015-04-25 ENCOUNTER — Inpatient Hospital Stay: Payer: 59

## 2015-04-25 ENCOUNTER — Inpatient Hospital Stay: Payer: 59 | Attending: Hematology and Oncology

## 2015-05-27 ENCOUNTER — Inpatient Hospital Stay: Payer: 59

## 2015-05-28 ENCOUNTER — Inpatient Hospital Stay: Payer: 59 | Attending: Hematology and Oncology

## 2015-05-28 DIAGNOSIS — E538 Deficiency of other specified B group vitamins: Secondary | ICD-10-CM | POA: Insufficient documentation

## 2015-05-28 DIAGNOSIS — Z23 Encounter for immunization: Secondary | ICD-10-CM | POA: Diagnosis not present

## 2015-05-28 DIAGNOSIS — Z79899 Other long term (current) drug therapy: Secondary | ICD-10-CM | POA: Insufficient documentation

## 2015-05-28 DIAGNOSIS — G35 Multiple sclerosis: Secondary | ICD-10-CM | POA: Insufficient documentation

## 2015-05-28 DIAGNOSIS — H539 Unspecified visual disturbance: Secondary | ICD-10-CM | POA: Diagnosis not present

## 2015-05-28 DIAGNOSIS — I10 Essential (primary) hypertension: Secondary | ICD-10-CM | POA: Insufficient documentation

## 2015-05-28 DIAGNOSIS — D509 Iron deficiency anemia, unspecified: Secondary | ICD-10-CM | POA: Diagnosis not present

## 2015-05-28 DIAGNOSIS — Z9884 Bariatric surgery status: Secondary | ICD-10-CM | POA: Insufficient documentation

## 2015-05-28 DIAGNOSIS — Z9049 Acquired absence of other specified parts of digestive tract: Secondary | ICD-10-CM | POA: Diagnosis not present

## 2015-05-28 DIAGNOSIS — R5383 Other fatigue: Secondary | ICD-10-CM | POA: Diagnosis not present

## 2015-05-28 DIAGNOSIS — E559 Vitamin D deficiency, unspecified: Secondary | ICD-10-CM | POA: Diagnosis not present

## 2015-05-28 LAB — CBC WITH DIFFERENTIAL/PLATELET
Basophils Absolute: 0 10*3/uL (ref 0–0.1)
Basophils Relative: 0 %
Eosinophils Absolute: 0 10*3/uL (ref 0–0.7)
Eosinophils Relative: 0 %
HCT: 37.4 % (ref 35.0–47.0)
Hemoglobin: 12.3 g/dL (ref 12.0–16.0)
Lymphocytes Relative: 6 %
Lymphs Abs: 0.2 10*3/uL — ABNORMAL LOW (ref 1.0–3.6)
MCH: 29.1 pg (ref 26.0–34.0)
MCHC: 33 g/dL (ref 32.0–36.0)
MCV: 88 fL (ref 80.0–100.0)
Monocytes Absolute: 0.5 10*3/uL (ref 0.2–0.9)
Monocytes Relative: 13 %
Neutro Abs: 2.9 10*3/uL (ref 1.4–6.5)
Neutrophils Relative %: 81 %
Platelets: 170 10*3/uL (ref 150–440)
RBC: 4.24 MIL/uL (ref 3.80–5.20)
RDW: 13.3 % (ref 11.5–14.5)
WBC: 3.6 10*3/uL (ref 3.6–11.0)

## 2015-05-28 LAB — FERRITIN: Ferritin: 10 ng/mL — ABNORMAL LOW (ref 11–307)

## 2015-05-28 LAB — VITAMIN B12: Vitamin B-12: 251 pg/mL (ref 180–914)

## 2015-05-30 ENCOUNTER — Other Ambulatory Visit: Payer: Self-pay

## 2015-05-30 DIAGNOSIS — E538 Deficiency of other specified B group vitamins: Secondary | ICD-10-CM

## 2015-05-30 DIAGNOSIS — D509 Iron deficiency anemia, unspecified: Secondary | ICD-10-CM

## 2015-05-31 ENCOUNTER — Ambulatory Visit: Payer: 59 | Admitting: Hematology and Oncology

## 2015-05-31 ENCOUNTER — Inpatient Hospital Stay (HOSPITAL_BASED_OUTPATIENT_CLINIC_OR_DEPARTMENT_OTHER): Payer: 59 | Admitting: Hematology and Oncology

## 2015-05-31 ENCOUNTER — Ambulatory Visit: Payer: 59

## 2015-05-31 ENCOUNTER — Inpatient Hospital Stay: Payer: 59

## 2015-05-31 VITALS — BP 149/82 | HR 64 | Temp 98.1°F | Resp 18 | Ht 64.0 in | Wt 244.3 lb

## 2015-05-31 VITALS — BP 157/90 | HR 50

## 2015-05-31 DIAGNOSIS — E538 Deficiency of other specified B group vitamins: Secondary | ICD-10-CM

## 2015-05-31 DIAGNOSIS — E559 Vitamin D deficiency, unspecified: Secondary | ICD-10-CM

## 2015-05-31 DIAGNOSIS — I10 Essential (primary) hypertension: Secondary | ICD-10-CM

## 2015-05-31 DIAGNOSIS — Z9884 Bariatric surgery status: Secondary | ICD-10-CM

## 2015-05-31 DIAGNOSIS — D509 Iron deficiency anemia, unspecified: Secondary | ICD-10-CM | POA: Diagnosis not present

## 2015-05-31 DIAGNOSIS — Z79899 Other long term (current) drug therapy: Secondary | ICD-10-CM | POA: Diagnosis not present

## 2015-05-31 DIAGNOSIS — H539 Unspecified visual disturbance: Secondary | ICD-10-CM

## 2015-05-31 DIAGNOSIS — R5383 Other fatigue: Secondary | ICD-10-CM

## 2015-05-31 DIAGNOSIS — G35 Multiple sclerosis: Secondary | ICD-10-CM

## 2015-05-31 DIAGNOSIS — Z23 Encounter for immunization: Secondary | ICD-10-CM

## 2015-05-31 DIAGNOSIS — Z9049 Acquired absence of other specified parts of digestive tract: Secondary | ICD-10-CM

## 2015-05-31 MED ORDER — SODIUM CHLORIDE 0.9 % IV SOLN
Freq: Once | INTRAVENOUS | Status: AC
  Administered 2015-05-31: 12:00:00 via INTRAVENOUS
  Filled 2015-05-31: qty 1000

## 2015-05-31 MED ORDER — CYANOCOBALAMIN 1000 MCG/ML IJ SOLN
1000.0000 ug | Freq: Once | INTRAMUSCULAR | Status: AC
  Administered 2015-05-31: 1000 ug via INTRAMUSCULAR
  Filled 2015-05-31: qty 1

## 2015-05-31 MED ORDER — SODIUM CHLORIDE 0.9 % IV SOLN
200.0000 mg | Freq: Once | INTRAVENOUS | Status: AC
  Administered 2015-05-31: 200 mg via INTRAVENOUS
  Filled 2015-05-31: qty 10

## 2015-05-31 MED ORDER — INFLUENZA VAC SPLIT QUAD 0.5 ML IM SUSY
0.5000 mL | PREFILLED_SYRINGE | Freq: Once | INTRAMUSCULAR | Status: AC
Start: 2015-05-31 — End: 2015-05-31
  Administered 2015-05-31: 0.5 mL via INTRAMUSCULAR
  Filled 2015-05-31: qty 0.5

## 2015-05-31 NOTE — Progress Notes (Signed)
[facility]-  Cancer Center  Clinic day:  05/31/2015   Chief Complaint: Jody Lee is a 44 y.o. female status post gastric bypass surgery and subsequent iron deficiency and B12 deficiency who is seen for 3 month assessment.  HPI: The patient was last seen in the medical oncology clinic on 02/22/2015.  At that time, she was craving protein.  She had some menstrual spotting.  Labs included a hematocrit of 37.8, hemoglobin 12.4, MCV 88.5, ferritin 73, and iron saturation 24%.  She received B12.  She received B12 on 03/25/2015.  Labs on 03/25/2015 revealed a hematocrit of 38.3, hemoglobin 12.6, MCV 88.4, platelets 204,000, WBC 3700 with an ANC of 3100.  Ferritin was 40.  B12 was 274.  During the interim, she has done well.  She notes her energy level is a little low.  She missed her B12 in 04/2015 as she was closing on a house.  She has moved from an apartment to a house.  She denies any melena or hematochezia.  She notes a little vaginal spotting.  Past Medical History  Diagnosis Date  . Hypertension   . Anemia   . MS (multiple sclerosis)   . Vitamin B 12 deficiency   . Vitamin D deficiency   . Vision abnormalities     Past Surgical History  Procedure Laterality Date  . Gastric bypass    . Cholecystectomy      Family History  Problem Relation Age of Onset  . Stroke Mother   . Heart disease Mother   . Asthma Mother   . Hypertension Father   . Heart disease Father   . Diabetes Father   . Asthma Brother   . Alcoholism Brother   . Obesity Brother   . Heart disease Brother     Social History:  reports that she has never smoked. She does not have any smokeless tobacco history on file. She reports that she does not drink alcohol. Her drug history is not on file.  She recently moved from an apartment into a house.  The patient is alone today.  Allergies:  Allergies  Allergen Reactions  . Nsaids Other (See Comments)    Not supposed to take d/t bariatric  surgery   . Ciprofloxacin Rash    Current Medications: Current Outpatient Prescriptions  Medication Sig Dispense Refill  . acetaminophen (TYLENOL) 325 MG tablet Take 650 mg by mouth every 4 (four) hours as needed.    . calcium-vitamin D (OSCAL WITH D) 500-200 MG-UNIT per tablet Take 1 tablet by mouth.    . Cyanocobalamin (VITAMIN B 12 PO) Take 1 tablet by mouth daily.    . ergocalciferol (VITAMIN D2) 50000 UNITS capsule Take 50,000 Units by mouth once a week.    . folic acid (FOLVITE) 1 MG tablet Take 2 mg by mouth daily.    Marland Kitchen GILENYA 0.5 MG CAPS Take 1 capsule (0.5 mg total) by mouth daily. 90 capsule 3  . hydrochlorothiazide (MICROZIDE) 12.5 MG capsule     . lisinopril (PRINIVIL,ZESTRIL) 40 MG tablet Take 40 mg by mouth daily.    . MedroxyPROGESTERone Acetate 150 MG/ML SUSY     . modafinil (PROVIGIL) 200 MG tablet Take 1 tablet (200 mg total) by mouth daily. 30 tablet 5  . Multiple Vitamin (MULTI-VITAMINS) TABS Take by mouth.    . diclofenac sodium (VOLTAREN) 1 % GEL Apply 1 application topically 2 (two) times daily as needed. (Patient not taking: Reported on 05/31/2015) 100 g 1  .  Magnesium 400 MG TABS Take 1 tablet by mouth at bedtime as needed. (Patient not taking: Reported on 05/31/2015) 30 tablet 0   Current Facility-Administered Medications  Medication Dose Route Frequency Provider Last Rate Last Dose  . Influenza vac split quadrivalent PF (FLUARIX) injection 0.5 mL  0.5 mL Intramuscular Once Rosey Bath, MD        Review of Systems:  GENERAL:  Feels"ok".  Energy level is a little low.  No fevers, sweats or weight loss. PERFORMANCE STATUS (ECOG):  1 HEENT:  No visual changes, runny nose, sore throat, mouth sores or tenderness. Lungs: No shortness of breath or cough.  No hemoptysis. Cardiac:  No chest pain, palpitations, orthopnea, or PND. GI:  No nausea, vomiting, diarrhea, constipation, melena or hematochezia. GU:  Spotting.  Normal periods.  Received Depot injection.   No urgency, frequency, dysuria, or hematuria. Musculoskeletal:  Occasional foot cramps.  No muscle tenderness. Extremities:  No pain or swelling. Skin:  No rashes or skin changes. Neuro:  Peripheral neuropathy.  No headache, numbness or weakness, balance or coordination issues. Endocrine:  No diabetes, thyroid issues, hot flashes or night sweats. Psych:  No mood changes, depression or anxiety. Pain:  No focal pain. Review of systems:  All other systems reviewed and found to be negative.   Physical Exam: Blood pressure 149/82, pulse 64, temperature 98.1 F (36.7 C), temperature source Tympanic, resp. rate 18, height 5\' 4"  (1.626 m), weight 244 lb 4.3 oz (110.8 kg). GENERAL:  Well developed, well nourished, sitting comfortably in the exam room in no acute distress. MENTAL STATUS:  Alert and oriented to person, place and time. HEAD:  Long brown hair.  Normocephalic, atraumatic, face symmetric, no Cushingoid features. EYES:  Glasses.  Blue eyes.  Pupils equal round and reactive to light and accomodation.  No conjunctivitis or scleral icterus. ENT:  Oropharynx clear without lesion.  Tongue normal. Mucous membranes moist.  RESPIRATORY:  Clear to auscultation without rales, wheezes or rhonchi. CARDIOVASCULAR:  Regular rate and rhythm without murmur, rub or gallop. ABDOMEN:  Fully round.  Soft, non-tender, with active bowel sounds, and no hepatosplenomegaly.  No masses. SKIN:  No rashes, ulcers or lesions. EXTREMITIES: No edema, no skin discoloration or tenderness.  No palpable cords. LYMPH NODES: No palpable cervical, supraclavicular, axillary or inguinal adenopathy  NEUROLOGICAL: Unremarkable. PSYCH:  Appropriate.  No visits with results within 3 Day(s) from this visit. Latest known visit with results is:  Clinical Support on 05/28/2015  Component Date Value Ref Range Status  . WBC 05/28/2015 3.6  3.6 - 11.0 K/uL Final  . RBC 05/28/2015 4.24  3.80 - 5.20 MIL/uL Final  . Hemoglobin  05/28/2015 12.3  12.0 - 16.0 g/dL Final  . HCT 41/96/2229 37.4  35.0 - 47.0 % Final  . MCV 05/28/2015 88.0  80.0 - 100.0 fL Final  . MCH 05/28/2015 29.1  26.0 - 34.0 pg Final  . MCHC 05/28/2015 33.0  32.0 - 36.0 g/dL Final  . RDW 79/89/2119 13.3  11.5 - 14.5 % Final  . Platelets 05/28/2015 170  150 - 440 K/uL Final  . Neutrophils Relative % 05/28/2015 81   Final  . Neutro Abs 05/28/2015 2.9  1.4 - 6.5 K/uL Final  . Lymphocytes Relative 05/28/2015 6   Final  . Lymphs Abs 05/28/2015 0.2* 1.0 - 3.6 K/uL Final  . Monocytes Relative 05/28/2015 13   Final  . Monocytes Absolute 05/28/2015 0.5  0.2 - 0.9 K/uL Final  . Eosinophils Relative 05/28/2015  0   Final  . Eosinophils Absolute 05/28/2015 0.0  0 - 0.7 K/uL Final  . Basophils Relative 05/28/2015 0   Final  . Basophils Absolute 05/28/2015 0.0  0 - 0.1 K/uL Final  . Ferritin 05/28/2015 10* 11 - 307 ng/mL Final  . Vitamin B-12 05/28/2015 251  180 - 914 pg/mL Final   Comment: (NOTE) This assay is not validated for testing neonatal or myeloproliferative syndrome specimens for Vitamin B12 levels. Performed at [facility]     Assessment:  Jody Lee is a 44 y.o. female  status post gastric bypass surgery (2003) with resultant iron deficiency anemia and B12 deficiency.  She is intolerant of oral iron.  She receives Venofer when her iron stores are low.  She was initially treated with B12 IM, but has subsequently been on oral B12.  B12 was sub-therapeutic on 06/27/2014.  She received weekly B12 x 6 (01/04/2015-02/22/2015) followed by monthly B12 (last 03/25/2015).  Folic acid was normal (12.5) on 01/25/2015.  She takes daily folic acid.    She has multiple sclerosis.  Symptomatically, she has mild fatigue. B12 was low normal (274) on 03/25/2015.  Ferritin is 10.  Plan: 1. Labs today:  CBC with diff, B12, ferritin. 2. Discuss declining ferritin and plan for IV iron infusion today.  Patient in agreement. 3. Discuss low normal B12 on  therapy prior to missing B12 in 04/2015.  Check MMA in 3 months. 4. B12 today and monthly. 5. Influenza vaccine. 6. RTC in 3 months for labs (CBC with diff, ferritin, MMA) +/- Venofer. 7. RTC in 6 months for MD assess and labs (CBC with diff, ferritin) +/- Venofer.   Rosey Bath, MD  05/31/2015, 11:03 AM

## 2015-05-31 NOTE — Progress Notes (Signed)
Pt reports no changes since last visit.  Always fatigued no changes reports as mild

## 2015-06-01 ENCOUNTER — Encounter: Payer: Self-pay | Admitting: Hematology and Oncology

## 2015-06-26 ENCOUNTER — Other Ambulatory Visit: Payer: Self-pay | Admitting: Hematology and Oncology

## 2015-06-26 ENCOUNTER — Other Ambulatory Visit: Payer: Self-pay

## 2015-06-26 ENCOUNTER — Encounter: Payer: Self-pay | Admitting: Family Medicine

## 2015-06-26 ENCOUNTER — Ambulatory Visit (INDEPENDENT_AMBULATORY_CARE_PROVIDER_SITE_OTHER): Payer: 59 | Admitting: Family Medicine

## 2015-06-26 VITALS — BP 108/77 | HR 78 | Temp 97.4°F | Resp 16 | Ht 64.0 in | Wt 233.6 lb

## 2015-06-26 DIAGNOSIS — D509 Iron deficiency anemia, unspecified: Secondary | ICD-10-CM

## 2015-06-26 DIAGNOSIS — J069 Acute upper respiratory infection, unspecified: Secondary | ICD-10-CM

## 2015-06-26 DIAGNOSIS — J40 Bronchitis, not specified as acute or chronic: Secondary | ICD-10-CM

## 2015-06-26 DIAGNOSIS — E538 Deficiency of other specified B group vitamins: Secondary | ICD-10-CM

## 2015-06-26 MED ORDER — BENZONATATE 100 MG PO CAPS: 100.0000 mg | ORAL_CAPSULE | Freq: Two times a day (BID) | ORAL | Status: DC | PRN

## 2015-06-26 MED ORDER — DOXYCYCLINE HYCLATE 100 MG PO TABS: 100.0000 mg | ORAL_TABLET | Freq: Two times a day (BID) | ORAL | Status: DC

## 2015-06-26 NOTE — Patient Instructions (Signed)
You can use supportive care at home to help with your symptoms.UseMucinex DM  to help break up the congestion and soothe your cough. You can takes this twice daily.  I have also sent tesslon perles to your pharmacy to help with the cough- you can take these 3 times daily as needed. Honey is a natural cough suppressant- so add it to your tea in the morning.  If you have a humidifer, set that up in your bedroom at night.  Please seek immediate medical attention if you develop shortness of breath not relieve by inhaler, chest pain/tightness, fever > 103 F or other concerning symptoms.

## 2015-06-26 NOTE — Progress Notes (Signed)
Subjective:    Patient ID: Jody Lee, female    DOB: [birth year], 44 y.o.   MRN: [id number]  HPI: Jody Lee is a 44 y.o. female presenting on 06/26/2015 for Nasal Congestion   HPI  Pt presents for nasal and chest congestion. Symptoms began last Wednesday 11/9. Started coughing on dark phlegm over the weekend. Chest tightness and wheezing at home. Pt has trouble fighting respiratory infections due to her MS.  Home treatment: Mucinex DM. Vicks vapor rub.  Fevers at home. 100 nightly.    Past Medical History  Diagnosis Date  . Hypertension   . Anemia   . MS (multiple sclerosis) (HCC)   . Vitamin B 12 deficiency   . Vitamin D deficiency   . Vision abnormalities     Current Outpatient Prescriptions on File Prior to Visit  Medication Sig  . acetaminophen (TYLENOL) 325 MG tablet Take 650 mg by mouth every 4 (four) hours as needed.  . calcium-vitamin D (OSCAL WITH D) 500-200 MG-UNIT per tablet Take 1 tablet by mouth.  . Cyanocobalamin (VITAMIN B 12 PO) Take 1 tablet by mouth daily.  . diclofenac sodium (VOLTAREN) 1 % GEL Apply 1 application topically 2 (two) times daily as needed.  . ergocalciferol (VITAMIN D2) 50000 UNITS capsule Take 50,000 Units by mouth once a week.  . folic acid (FOLVITE) 1 MG tablet Take 2 mg by mouth daily.  Marland Kitchen GILENYA 0.5 MG CAPS Take 1 capsule (0.5 mg total) by mouth daily.  . hydrochlorothiazide (MICROZIDE) 12.5 MG capsule   . lisinopril (PRINIVIL,ZESTRIL) 40 MG tablet Take 40 mg by mouth daily.  . Magnesium 400 MG TABS Take 1 tablet by mouth at bedtime as needed.  . MedroxyPROGESTERone Acetate 150 MG/ML SUSY   . modafinil (PROVIGIL) 200 MG tablet Take 1 tablet (200 mg total) by mouth daily.  . Multiple Vitamin (MULTI-VITAMINS) TABS Take by mouth.   No current facility-administered medications on file prior to visit.    Review of Systems  Constitutional: Positive for fever and chills.  HENT: Positive for congestion and sinus pressure. Negative  for ear pain, sneezing and sore throat.   Respiratory: Positive for cough, chest tightness and wheezing.   Cardiovascular: Negative for chest pain and palpitations.  Gastrointestinal: Negative.  Negative for nausea, vomiting and diarrhea.  Musculoskeletal: Positive for neck pain.  Neurological: Positive for headaches.   Per HPI unless specifically indicated above     Objective:    BP 108/77 mmHg  Pulse 78  Temp(Src) 97.4 F (36.3 C) (Oral)  Resp 16  Ht 5\' 4"  (1.626 m)  Wt 233 lb 9.6 oz (105.96 kg)  BMI 40.08 kg/m2  SpO2 100%  Wt Readings from Last 3 Encounters:  06/26/15 233 lb 9.6 oz (105.96 kg)  05/31/15 244 lb 4.3 oz (110.8 kg)  03/25/15 239 lb (108.41 kg)    Physical Exam  Constitutional: She appears well-developed and well-nourished. No distress.  HENT:  Head: Normocephalic.  Right Ear: Hearing and tympanic membrane normal. Tympanic membrane is not erythematous and not bulging.  Left Ear: Hearing and tympanic membrane normal. Tympanic membrane is not erythematous and not bulging.  Nose: Mucosal edema and rhinorrhea present. No sinus tenderness. Right sinus exhibits no maxillary sinus tenderness and no frontal sinus tenderness. Left sinus exhibits no maxillary sinus tenderness and no frontal sinus tenderness.  Mouth/Throat: Uvula is midline and mucous membranes are normal. Posterior oropharyngeal erythema present. No posterior oropharyngeal edema.  Neck: Neck supple. No Brudzinski's sign  and no Kernig's sign noted.  Cardiovascular: Normal rate, regular rhythm and normal heart sounds.   Pulmonary/Chest: No accessory muscle usage. No tachypnea. No respiratory distress. She has wheezes (with coughing. ) in the right lower field and the left lower field. She has no rhonchi. She has no rales.  Lymphadenopathy:    She has no cervical adenopathy.   Results for orders placed or performed in visit on 05/28/15  CBC with Differential/Platelet  Result Value Ref Range   WBC 3.6 3.6  - 11.0 K/uL   RBC 4.24 3.80 - 5.20 MIL/uL   Hemoglobin 12.3 12.0 - 16.0 g/dL   HCT 16.1 09.6 - 04.5 %   MCV 88.0 80.0 - 100.0 fL   MCH 29.1 26.0 - 34.0 pg   MCHC 33.0 32.0 - 36.0 g/dL   RDW 40.9 81.1 - 91.4 %   Platelets 170 150 - 440 K/uL   Neutrophils Relative % 81 %   Neutro Abs 2.9 1.4 - 6.5 K/uL   Lymphocytes Relative 6 %   Lymphs Abs 0.2 (L) 1.0 - 3.6 K/uL   Monocytes Relative 13 %   Monocytes Absolute 0.5 0.2 - 0.9 K/uL   Eosinophils Relative 0 %   Eosinophils Absolute 0.0 0 - 0.7 K/uL   Basophils Relative 0 %   Basophils Absolute 0.0 0 - 0.1 K/uL  Ferritin  Result Value Ref Range   Ferritin 10 (L) 11 - 307 ng/mL  Vitamin B12  Result Value Ref Range   Vitamin B-12 251 180 - 914 pg/mL      Assessment & Plan:   Problem List Items Addressed This Visit    None    Visit Diagnoses    Bronchitis    -  Primary    Due to fevers and duration of symptoms treat with doxy BID for 7 days. Supportive care at home. Alarm symptoms reviewed. Return precautions reviewed.     Upper respiratory infection        Supportive care at home. Pt advised to avoid Sudafed in larger amounts due to HBP.        Meds ordered this encounter  Medications  . benzonatate (TESSALON) 100 MG capsule    Sig: Take 1 capsule (100 mg total) by mouth 2 (two) times daily as needed for cough.    Dispense:  20 capsule    Refill:  0    Order Specific Question:  Supervising Provider    Answer:  Janeann Forehand [[id number]]  . doxycycline (VIBRA-TABS) 100 MG tablet    Sig: Take 1 tablet (100 mg total) by mouth 2 (two) times daily.    Dispense:  14 tablet    Refill:  0    Order Specific Question:  Supervising Provider    Answer:  Janeann Forehand [[id number]]      Follow up plan: Return if symptoms worsen or fail to improve.

## 2015-06-28 ENCOUNTER — Other Ambulatory Visit: Payer: Self-pay | Admitting: Hematology and Oncology

## 2015-06-28 ENCOUNTER — Other Ambulatory Visit: Payer: 59

## 2015-06-28 ENCOUNTER — Inpatient Hospital Stay: Payer: 59 | Attending: Hematology and Oncology

## 2015-06-28 DIAGNOSIS — Z79899 Other long term (current) drug therapy: Secondary | ICD-10-CM | POA: Diagnosis not present

## 2015-06-28 DIAGNOSIS — E538 Deficiency of other specified B group vitamins: Secondary | ICD-10-CM | POA: Insufficient documentation

## 2015-06-28 MED ORDER — CYANOCOBALAMIN 1000 MCG/ML IJ SOLN
1000.0000 ug | Freq: Once | INTRAMUSCULAR | Status: AC
  Administered 2015-06-28: 1000 ug via INTRAMUSCULAR
  Filled 2015-06-28: qty 1

## 2015-07-26 ENCOUNTER — Other Ambulatory Visit: Payer: 59

## 2015-07-26 ENCOUNTER — Inpatient Hospital Stay: Payer: 59 | Attending: Hematology and Oncology

## 2015-07-26 DIAGNOSIS — Z79899 Other long term (current) drug therapy: Secondary | ICD-10-CM | POA: Diagnosis not present

## 2015-07-26 DIAGNOSIS — E538 Deficiency of other specified B group vitamins: Secondary | ICD-10-CM | POA: Insufficient documentation

## 2015-07-26 MED ORDER — CYANOCOBALAMIN 1000 MCG/ML IJ SOLN
1000.0000 ug | Freq: Once | INTRAMUSCULAR | Status: AC
  Administered 2015-07-26: 1000 ug via INTRAMUSCULAR
  Filled 2015-07-26: qty 1

## 2015-08-15 ENCOUNTER — Encounter: Payer: Self-pay | Admitting: Neurology

## 2015-08-15 ENCOUNTER — Encounter: Payer: Self-pay | Admitting: Family Medicine

## 2015-08-15 ENCOUNTER — Ambulatory Visit (INDEPENDENT_AMBULATORY_CARE_PROVIDER_SITE_OTHER): Payer: 59 | Admitting: Family Medicine

## 2015-08-15 VITALS — BP 130/88 | HR 60 | Temp 97.5°F | Resp 16 | Ht 64.0 in | Wt 242.0 lb

## 2015-08-15 DIAGNOSIS — I10 Essential (primary) hypertension: Secondary | ICD-10-CM | POA: Diagnosis not present

## 2015-08-15 DIAGNOSIS — J209 Acute bronchitis, unspecified: Secondary | ICD-10-CM

## 2015-08-15 DIAGNOSIS — J029 Acute pharyngitis, unspecified: Secondary | ICD-10-CM | POA: Diagnosis not present

## 2015-08-15 DIAGNOSIS — R6889 Other general symptoms and signs: Secondary | ICD-10-CM | POA: Diagnosis not present

## 2015-08-15 LAB — POCT INFLUENZA A/B
Influenza A, POC: NEGATIVE
Influenza B, POC: NEGATIVE

## 2015-08-15 LAB — POCT RAPID STREP A (OFFICE): RAPID STREP A SCREEN: NEGATIVE

## 2015-08-15 MED ORDER — DOXYCYCLINE HYCLATE 100 MG PO TABS: 100.0000 mg | ORAL_TABLET | Freq: Two times a day (BID) | ORAL | Status: DC

## 2015-08-15 MED ORDER — ALBUTEROL SULFATE HFA 108 (90 BASE) MCG/ACT IN AERS: 2.0000 | INHALATION_SPRAY | Freq: Four times a day (QID) | RESPIRATORY_TRACT | Status: DC | PRN

## 2015-08-15 MED ORDER — BENZONATATE 100 MG PO CAPS: 100.0000 mg | ORAL_CAPSULE | Freq: Three times a day (TID) | ORAL | Status: DC | PRN

## 2015-08-15 NOTE — Progress Notes (Signed)
Subjective:    Patient ID: Jody Lee, female    DOB: October 02, 1970, 45 y.o.   MRN: [id number]  HPI: Jody Lee is a 45 y.o. female presenting on 08/15/2015 for Cough   HPI  Pt presents for cough and nasal congestion. Symptoms began 2 weeks ago. Nasal drainage and congestion. Sinus HA, head pressure. Cough- yellow phlegm. Chest tightness with coughing and cold air.    Past Medical History  Diagnosis Date  . Hypertension   . Anemia   . MS (multiple sclerosis) (HCC)   . Vitamin B 12 deficiency   . Vitamin D deficiency   . Vision abnormalities     Current Outpatient Prescriptions on File Prior to Visit  Medication Sig  . acetaminophen (TYLENOL) 325 MG tablet Take 650 mg by mouth every 4 (four) hours as needed.  . calcium-vitamin D (OSCAL WITH D) 500-200 MG-UNIT per tablet Take 1 tablet by mouth.  . Cyanocobalamin (VITAMIN B 12 PO) Take 1 tablet by mouth daily.  . diclofenac sodium (VOLTAREN) 1 % GEL Apply 1 application topically 2 (two) times daily as needed.  . folic acid (FOLVITE) 1 MG tablet Take 2 mg by mouth daily.  Marland Kitchen GILENYA 0.5 MG CAPS Take 1 capsule (0.5 mg total) by mouth daily.  . hydrochlorothiazide (MICROZIDE) 12.5 MG capsule   . lisinopril (PRINIVIL,ZESTRIL) 40 MG tablet Take 40 mg by mouth daily.  . Magnesium 400 MG TABS Take 1 tablet by mouth at bedtime as needed.  . MedroxyPROGESTERone Acetate 150 MG/ML SUSY   . modafinil (PROVIGIL) 200 MG tablet Take 1 tablet (200 mg total) by mouth daily.  . Multiple Vitamin (MULTI-VITAMINS) TABS Take by mouth.   No current facility-administered medications on file prior to visit.    Review of Systems  Constitutional: Positive for fever. Negative for chills.  HENT: Positive for congestion, sinus pressure and sore throat. Negative for ear pain and trouble swallowing.   Respiratory: Positive for cough, chest tightness and wheezing.   Cardiovascular: Negative for chest pain and leg swelling.  Gastrointestinal: Negative  for nausea, vomiting, abdominal pain, diarrhea and constipation.  Endocrine: Negative.  Negative for cold intolerance, heat intolerance, polydipsia, polyphagia and polyuria.  Genitourinary: Negative for dysuria and difficulty urinating.  Musculoskeletal: Negative.  Negative for neck pain and neck stiffness.  Skin: Negative for rash.  Neurological: Negative for dizziness, light-headedness and numbness.  Psychiatric/Behavioral: Negative.    Per HPI unless specifically indicated above     Objective:    BP 130/88 mmHg  Pulse 60  Temp(Src) 97.5 F (36.4 C) (Oral)  Resp 16  Ht 5\' 4"  (1.626 m)  Wt 242 lb (109.77 kg)  BMI 41.52 kg/m2  Wt Readings from Last 3 Encounters:  08/15/15 242 lb (109.77 kg)  06/26/15 233 lb 9.6 oz (105.96 kg)  05/31/15 244 lb 4.3 oz (110.8 kg)    Physical Exam  Constitutional: She is oriented to person, place, and time. She appears well-developed and well-nourished.  HENT:  Head: Normocephalic and atraumatic.  Right Ear: Hearing normal.  Left Ear: Tympanic membrane is retracted.  Nose: Mucosal edema present. No rhinorrhea. Right sinus exhibits frontal sinus tenderness. Left sinus exhibits frontal sinus tenderness.  Mouth/Throat: Uvula is midline and mucous membranes are normal. Posterior oropharyngeal erythema present.  Neck: Neck supple.  Cardiovascular: Normal rate, regular rhythm and normal heart sounds.  Exam reveals no gallop and no friction rub.   No murmur heard. Pulmonary/Chest: Effort normal. She has no decreased breath sounds.  She has wheezes (with coughing) in the right lower field and the left lower field. She has no rhonchi. She has no rales. Chest wall is not dull to percussion. She exhibits no tenderness.  Abdominal: Soft. Normal appearance and bowel sounds are normal. She exhibits no distension and no mass. There is no tenderness. There is no rebound and no guarding.  Musculoskeletal: Normal range of motion. She exhibits no edema or tenderness.    Lymphadenopathy:    She has no cervical adenopathy.  Neurological: She is alert and oriented to person, place, and time.  Skin: Skin is warm and dry.   Results for orders placed or performed in visit on 08/15/15  POCT rapid strep A  Result Value Ref Range   Rapid Strep A Screen Negative Negative      Assessment & Plan:   Problem List Items Addressed This Visit      Cardiovascular and Mediastinum   Benign essential HTN    BP elevated today due to illness. Pt advised to avoid OTC decongestants due to her HTN. Pt will monitor at home. Call with continued elevation.        Other Visit Diagnoses    Flu-like symptoms    -  Primary    Flu test negative.     Relevant Orders    POCT Influenza A/B    Sore throat        Rapid strep negative. Throat culture pending.     Relevant Orders    POCT rapid strep A (Completed)    Grp A Strep    Acute bronchitis, unspecified organism        Treat with antibiotic given 2 week duration of symptoms. Doxycycline due to Zpak interaction with MS meds. Supportive care at home. Alarm symptoms reviewed. PRN inhaler given.     Relevant Medications    albuterol (PROVENTIL HFA;VENTOLIN HFA) 108 (90 Base) MCG/ACT inhaler       Meds ordered this encounter  Medications  . DISCONTD: doxycycline (VIBRA-TABS) 100 MG tablet    Sig: Take 1 tablet (100 mg total) by mouth 2 (two) times daily.    Dispense:  20 tablet    Refill:  0    Order Specific Question:  Supervising Provider    Answer:  Janeann Forehand [phone]  . benzonatate (TESSALON) 100 MG capsule    Sig: Take 1 capsule (100 mg total) by mouth 3 (three) times daily as needed for cough.    Dispense:  20 capsule    Refill:  0    Order Specific Question:  Supervising Provider    Answer:  Janeann Forehand [[id number]]  . albuterol (PROVENTIL HFA;VENTOLIN HFA) 108 (90 Base) MCG/ACT inhaler    Sig: Inhale 2 puffs into the lungs every 6 (six) hours as needed for wheezing or shortness of breath.     Dispense:  1 Inhaler    Refill:  2    Order Specific Question:  Supervising Provider    Answer:  Janeann Forehand [id number]  . doxycycline (VIBRA-TABS) 100 MG tablet    Sig: Take 1 tablet (100 mg total) by mouth 2 (two) times daily.    Dispense:  14 tablet    Refill:  0    Order Specific Question:  Supervising Provider    Answer:  Janeann Forehand [[id number]]      Follow up plan: Return if symptoms worsen or fail to improve.

## 2015-08-15 NOTE — Patient Instructions (Signed)
You can use supportive care at home to help with your symptoms. I have sent Mucinex DM to your pharmacy to help break up the congestion and soothe your cough. You can takes this twice daily.  I have also sent tesslon perles to your pharmacy to help with the cough- you can take these 3 times daily as needed. Honey is a natural cough suppressant- so add it to your tea in the morning.  If you have a humidifer, set that up in your bedroom at night.   Please seek immediate medical attention if you develop shortness of breath not relieve by inhaler, chest pain/tightness, fever > 103 F or other concerning symptoms.   

## 2015-08-15 NOTE — Assessment & Plan Note (Signed)
BP elevated today due to illness. Pt advised to avoid OTC decongestants due to her HTN. Pt will monitor at home. Call with continued elevation.

## 2015-08-17 LAB — TEST CODE CHANGE

## 2015-08-18 LAB — CULTURE, GROUP A STREP

## 2015-08-20 ENCOUNTER — Telehealth: Payer: Self-pay | Admitting: Family Medicine

## 2015-08-20 MED ORDER — PENICILLIN V POTASSIUM 500 MG PO TABS: 500.0000 mg | ORAL_TABLET | Freq: Three times a day (TID) | ORAL | Status: DC

## 2015-08-20 NOTE — Telephone Encounter (Signed)
Called pt, LMTCB. Throat culture came back positive for non-group A strep.  Not dangerous but we should treat. She needs to start Penicillin 500mg  three times daily for 10 days to treat.

## 2015-08-21 ENCOUNTER — Inpatient Hospital Stay: Payer: 59 | Attending: Hematology and Oncology

## 2015-08-21 DIAGNOSIS — E538 Deficiency of other specified B group vitamins: Secondary | ICD-10-CM | POA: Diagnosis not present

## 2015-08-21 DIAGNOSIS — D509 Iron deficiency anemia, unspecified: Secondary | ICD-10-CM

## 2015-08-21 DIAGNOSIS — Z79899 Other long term (current) drug therapy: Secondary | ICD-10-CM | POA: Diagnosis not present

## 2015-08-21 LAB — FERRITIN: Ferritin: 61 ng/mL (ref 11–307)

## 2015-08-21 LAB — CBC WITH DIFFERENTIAL/PLATELET
Basophils Absolute: 0 10*3/uL (ref 0–0.1)
Basophils Relative: 0 %
Eosinophils Absolute: 0 10*3/uL (ref 0–0.7)
Eosinophils Relative: 0 %
HCT: 38.9 % (ref 35.0–47.0)
Hemoglobin: 12.9 g/dL (ref 12.0–16.0)
Lymphocytes Relative: 7 %
Lymphs Abs: 0.2 10*3/uL — ABNORMAL LOW (ref 1.0–3.6)
MCH: 29 pg (ref 26.0–34.0)
MCHC: 33.3 g/dL (ref 32.0–36.0)
MCV: 86.9 fL (ref 80.0–100.0)
Monocytes Absolute: 0.4 10*3/uL (ref 0.2–0.9)
Monocytes Relative: 13 %
Neutro Abs: 2.2 10*3/uL (ref 1.4–6.5)
Neutrophils Relative %: 80 %
Platelets: 213 10*3/uL (ref 150–440)
RBC: 4.47 MIL/uL (ref 3.80–5.20)
RDW: 15.2 % — ABNORMAL HIGH (ref 11.5–14.5)
WBC: 2.8 10*3/uL — ABNORMAL LOW (ref 3.6–11.0)

## 2015-08-21 NOTE — Telephone Encounter (Signed)
Called patient and relayed culture results. Notified her of abx that has been sent to her pharmacy and directions.

## 2015-08-23 ENCOUNTER — Other Ambulatory Visit: Payer: Self-pay | Admitting: Hematology and Oncology

## 2015-08-23 ENCOUNTER — Inpatient Hospital Stay: Payer: 59

## 2015-08-23 ENCOUNTER — Other Ambulatory Visit: Payer: 59

## 2015-08-23 DIAGNOSIS — E538 Deficiency of other specified B group vitamins: Secondary | ICD-10-CM | POA: Diagnosis not present

## 2015-08-23 LAB — METHYLMALONIC ACID, SERUM: Methylmalonic Acid, Quantitative: 254 nmol/L (ref 0–378)

## 2015-08-23 MED ORDER — CYANOCOBALAMIN 1000 MCG/ML IJ SOLN
1000.0000 ug | Freq: Once | INTRAMUSCULAR | Status: AC
  Administered 2015-08-23: 1000 ug via INTRAMUSCULAR
  Filled 2015-08-23: qty 1

## 2015-09-19 ENCOUNTER — Encounter: Payer: Self-pay | Admitting: Neurology

## 2015-09-19 ENCOUNTER — Inpatient Hospital Stay: Payer: 59 | Attending: Hematology and Oncology

## 2015-09-19 ENCOUNTER — Ambulatory Visit (INDEPENDENT_AMBULATORY_CARE_PROVIDER_SITE_OTHER): Payer: 59 | Admitting: Neurology

## 2015-09-19 VITALS — BP 112/76 | HR 64 | Resp 16 | Ht 64.0 in | Wt 241.0 lb

## 2015-09-19 DIAGNOSIS — R5383 Other fatigue: Secondary | ICD-10-CM

## 2015-09-19 DIAGNOSIS — Z79899 Other long term (current) drug therapy: Secondary | ICD-10-CM | POA: Diagnosis not present

## 2015-09-19 DIAGNOSIS — G35 Multiple sclerosis: Secondary | ICD-10-CM | POA: Diagnosis not present

## 2015-09-19 DIAGNOSIS — E538 Deficiency of other specified B group vitamins: Secondary | ICD-10-CM | POA: Diagnosis not present

## 2015-09-19 DIAGNOSIS — R269 Unspecified abnormalities of gait and mobility: Secondary | ICD-10-CM

## 2015-09-19 DIAGNOSIS — G4726 Circadian rhythm sleep disorder, shift work type: Secondary | ICD-10-CM

## 2015-09-19 MED ORDER — CYANOCOBALAMIN 1000 MCG/ML IJ SOLN
1000.0000 ug | Freq: Once | INTRAMUSCULAR | Status: AC
  Administered 2015-09-19: 1000 ug via INTRAMUSCULAR
  Filled 2015-09-19: qty 1

## 2015-09-19 MED ORDER — MODAFINIL 200 MG PO TABS: 200.0000 mg | ORAL_TABLET | Freq: Every day | ORAL | Status: DC

## 2015-09-19 NOTE — Progress Notes (Signed)
[facility]  PATIENT: Jody Lee DOB: 09/14/1970  REFERRING DOCTOR OR PCP:  Larene Beach SOURCE: patient and labs, notes in EMR and MRI on PACS  _________________________________   HISTORICAL  CHIEF COMPLAINT:  Chief Complaint  Patient presents with  . Multiple Sclerosis    Sts. she continues to tolerate Gilenya well.  Sts. she thinks her ability to shift from task to task smoothly is decreased.  Also thinks both hands are weakner.  She has been out of her Provigil for the lst 3 mos. so fatigue has been worse./fim    HISTORY OF PRESENT ILLNESS:  Jody Lee is a 45 year old woman with MS diagnosed in 2005.   She has noticed several new symptoms,.  Her hands seem weaker.   She has more fatigue.   At night, she gets foot cramps.   She has been noted to moan in her sleep.   She is on Gilenya and she tolerates it well. She has not had any definite exacerbation while on it.  Lymphocyte count last month was 0.2  MS History:   She was diagnosed with MS in 2005 after presenting with a spinal cord syndrome. She started with numbness and pain in the feet that worked its way up to her abdomen over a couple days.   Initially, she had an MRI of the spine that showed a plaque consistent with her symptoms. She was referred to Dr. Ermalene Postin. He did a lumbar puncture and ordered an MRI of the brain. Both were consistent with multiple sclerosis and I started to see her. Initially, she was placed on Betaseron and did very well for the first few years. Then around 2011 or 2012, she had an exacerbation and testing showed that she had developed antibodies against Betaseron. Therefore, she was switched to Gilenya at that time. She has been on Gilenya for the last 4+ years and has done well. Specifically, she has not reported any difficulties with exacerbations and she tolerates the medication well.   Her last MRI was performed at [facility] November 2015.  MRI of the brain  from 03/02/2013 showed  typical periventricular T2/FLAIR hyperintense foci, predominantly in the periventricular white matter.   There were no acute foci.  Gait/strength/sensation:  She is walking ok with occasional stumbles if her right foot catches while walking.   Sometimes she veers while walking.  She denies any major problem with weakness or numbness. However, she will occasionally note some issues with her hands with both mild weakness and numbness.   She has more trouble using a can opener and occasionally is dropping items.    Bladder: She denies any difficulty with urinary frequency or urgency. She does not have incontinence.  Vision: She denies any MS related visual problems.   She has new glasses  Fatigue/sleep: Her bigger problems is that she has difficulty with both physical and cognitive fatigue. She has noted a benefit with Provigil. She ran out a couple months ago and insurance has not authorized more.     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   Caffeine has much less benefit. Stimulants were poorly tolerated (mood).   She has some difficulty falling asleep but once asleep often stays asleep.    Often she has difficulty turning her mind off.   Her fianc has noted that she moans that her sleep sometimes. She has not had  any screaming, kicking or punching.  Mood/cognition: She denies any major difficulty with depression or anxiety at this time. She notes some cognitive difficulty. This is mostly problems with focusing and distraction and is improved with Provigil. She strays mentally active.  Other: Her anemia is doing better and she gets intermittent iron infusions and B12 monthly.     REVIEW OF SYSTEMS: Constitutional: No fevers, chills, sweats, or change in appetite.   She notes fatigue. Eyes: No visual changes, double vision, eye pain Ear, nose and throat: No hearing loss, ear pain, nasal  congestion, sore throat Cardiovascular: No chest pain, palpitations Respiratory: No shortness of breath at rest or with exertion.   No wheezes GastrointestinaI: No nausea, vomiting, diarrhea, abdominal pain, fecal incontinence Genitourinary: No dysuria, urinary retention or frequency.  No nocturia. Musculoskeletal: No neck pain, back pain Integumentary: No rash, pruritus, skin lesions Neurological: as above Psychiatric: No depression at this time.  No anxiety Endocrine: No palpitations, diaphoresis, change in appetite, change in weigh or increased thirst Hematologic/Lymphatic: No anemia, purpura, petechiae. Allergic/Immunologic: No itchy/runny eyes, nasal congestion, recent allergic reactions, rashes  ALLERGIES: Allergies  Allergen Reactions  . Nsaids Other (See Comments)    Not supposed to take d/t bariatric surgery   . Ciprofloxacin Rash    HOME MEDICATIONS:  Current outpatient prescriptions:  .  acetaminophen (TYLENOL) 325 MG tablet, Take 650 mg by mouth every 4 (four) hours as needed., Disp: , Rfl:  .  Cyanocobalamin (VITAMIN B 12 PO), Take 1 tablet by mouth daily., Disp: , Rfl:  .  folic acid (FOLVITE) 1 MG tablet, Take 2 mg by mouth daily., Disp: , Rfl:  .  GILENYA 0.5 MG CAPS, Take 1 capsule (0.5 mg total) by mouth daily., Disp: 90 capsule, Rfl: 3 .  hydrochlorothiazide (MICROZIDE) 12.5 MG capsule, , Disp: , Rfl:  .  lisinopril (PRINIVIL,ZESTRIL) 40 MG tablet, Take 40 mg by mouth daily., Disp: , Rfl:  .  Magnesium 400 MG TABS, Take 1 tablet by mouth at bedtime as needed., Disp: 30 tablet, Rfl: 0 .  MedroxyPROGESTERone Acetate 150 MG/ML SUSY, , Disp: , Rfl:  .  modafinil (PROVIGIL) 200 MG tablet, Take 1 tablet (200 mg total) by mouth daily., Disp: 30 tablet, Rfl: 5 .  Multiple Vitamin (MULTI-VITAMINS) TABS, Take by mouth., Disp: , Rfl:  .  albuterol (PROVENTIL HFA;VENTOLIN HFA) 108 (90 Base) MCG/ACT inhaler, Inhale 2 puffs into the lungs every 6 (six) hours as needed for  wheezing or shortness of breath. (Patient not taking: Reported on 09/19/2015), Disp: 1 Inhaler, Rfl: 2 .  benzonatate (TESSALON) 100 MG capsule, Take 1 capsule (100 mg total) by mouth 3 (three) times daily as needed for cough. (Patient not taking: Reported on 09/19/2015), Disp: 20 capsule, Rfl: 0 .  calcium-vitamin D (OSCAL WITH D) 500-200 MG-UNIT per tablet, Take 1 tablet by mouth. Reported on 09/19/2015, Disp: , Rfl:  .  diclofenac sodium (VOLTAREN) 1 % GEL, Apply 1 application topically 2 (two) times daily as needed. (Patient not taking: Reported on 09/19/2015), Disp: 100 g, Rfl: 1 .  doxycycline (VIBRA-TABS) 100 MG tablet, Take 1 tablet (100 mg total) by mouth 2 (two) times daily. (Patient not taking: Reported on 09/19/2015), Disp: 14 tablet, Rfl: 0 .  penicillin v potassium (VEETID) 500 MG tablet, Take 1 tablet (500 mg total) by mouth 3 (three) times daily. (Patient not taking: Reported on 09/19/2015), Disp: 30 tablet, Rfl: 0  PAST MEDICAL HISTORY: Past Medical History  Diagnosis Date  .  Hypertension   . Anemia   . MS (multiple sclerosis) ([place])   . Vitamin B 12 deficiency   . Vitamin D deficiency   . Vision abnormalities     PAST SURGICAL HISTORY: Past Surgical History  Procedure Laterality Date  . Gastric bypass    . Cholecystectomy      FAMILY HISTORY: Family History  Problem Relation Age of Onset  . Stroke Mother   . Heart disease Mother   . Asthma Mother   . Hypertension Father   . Heart disease Father   . Diabetes Father   . Asthma Brother   . Alcoholism Brother   . Obesity Brother   . Heart disease Brother     SOCIAL HISTORY:  Social History   Social History  . Marital Status: Single    Spouse Name: N/A  . Number of Children: N/A  . Years of Education: N/A   Occupational History  . Not on file.   Social History Main Topics  . Smoking status: Never Smoker   . Smokeless tobacco: Not on file  . Alcohol Use: No  . Drug Use: Not on file  . Sexual Activity: Not on  file   Other Topics Concern  . Not on file   Social History Narrative     PHYSICAL EXAM  Filed Vitals:   09/19/15 0957  BP: 112/76  Pulse: 64  Resp: 16  Height: _0  (1.626 m)  Weight: 241 lb (109.317 kg)    Body mass index is 41.35 kg/(m^2).   General: The patient is well-developed and well-nourished and in no acute distress   Neck: The neck has good ROM  Neurologic Exam  Mental status: The patient is alert and oriented x 3 at the time of the examination. The patient has apparent normal recent and remote memory, with an apparently normal attention span and concentration ability.   Speech is normal.  Cranial nerves: Extraocular movements are full.   There is good facial sensation to soft touch bilaterally.Facial strength is normal.  Trapezius and sternocleidomastoid strength is normal. No dysarthria is noted.  The tongue is midline, and the patient has symmetric elevation of the soft palate. No obvious hearing deficits are noted.  Motor:  Muscle bulk is normal.   Tone is normal. Strength is  5 / 5 in all 4 extremities.   Sensory: Sensory testing is intact to touch and vibration sensation in all 4 extremities.  Coordination: Cerebellar testing reveals good finger-nose-finger and heel-to-shin bilaterally.  Gait and station: Station is normal.   Gait is mildly wide.   Tandem gait is wide. Romberg is negative.   Reflexes: Deep tendon reflexes are symmetric and normal bilaterally.        DIAGNOSTIC DATA (LABS, IMAGING, TESTING) - I reviewed patient records, labs, notes, testing and imaging myself where available.  Lab Results  Component Value Date   WBC 2.8* 08/21/2015   HGB 12.9 08/21/2015   HCT 38.9 08/21/2015   MCV 86.9 08/21/2015   PLT 213 08/21/2015      Component Value Date/Time   NA 139 03/07/2015 1045   NA 139 06/27/2014 1356   K 4.1 03/07/2015 1045   K 3.9 06/27/2014 1356   CL 107 03/07/2015 1045   CL 106 06/27/2014 1356   CO2 25 03/07/2015 1045     CO2 28 06/27/2014 1356   GLUCOSE 95 03/07/2015 1045   GLUCOSE 89 06/27/2014 1356   BUN 17 03/07/2015 1045   BUN 7 06/27/2014  1356   CREATININE 1.11* 03/07/2015 1045   CREATININE 0.99 06/27/2014 1356   CALCIUM 9.0 03/07/2015 1045   CALCIUM 8.3* 06/27/2014 1356   PROT 6.5 03/07/2015 1045   PROT 6.9 06/27/2014 1356   ALBUMIN 4.0 03/07/2015 1045   ALBUMIN 3.8 06/27/2014 1356   AST 20 03/07/2015 1045   AST 15 06/27/2014 1356   ALT 23 03/07/2015 1045   ALT 20 06/27/2014 1356   ALKPHOS 70 03/07/2015 1045   ALKPHOS 105 06/27/2014 1356   BILITOT 0.7 03/07/2015 1045   BILITOT 0.4 06/27/2014 1356   GFRNONAA 60* 03/07/2015 1045   GFRNONAA >60 06/27/2014 1356   GFRNONAA >60 12/26/2013 1031   GFRAA >60 03/07/2015 1045   GFRAA >60 06/27/2014 1356   GFRAA >60 12/26/2013 1031    Lab Results  Component Value Date   VITAMINB12 251 05/28/2015   Lab Results  Component Value Date   TSH 1.887 03/07/2015       ASSESSMENT AND PLAN  Multiple sclerosis (HCC)  Gait disturbance  Other fatigue  Shifting sleep-work schedule    1.   Continue Gilenya.  2.   Continue Provigil for shift work work sleep disturbance, fatigue and sleepiness 3,   she will return in 6 months or sooner if there are new or worsening neurologic symptoms.    Randen Kauth A. Felecia Shelling, MD, PhD 10/10/6710, 45:80 AM Certified in Neurology, Clinical Neurophysiology, Sleep Medicine, Pain Medicine and Neuroimaging  [facility] [address], [place], [place] [address] [phone]

## 2015-09-20 ENCOUNTER — Other Ambulatory Visit: Payer: Self-pay | Admitting: Hematology and Oncology

## 2015-09-20 ENCOUNTER — Inpatient Hospital Stay: Payer: 59

## 2015-09-30 ENCOUNTER — Encounter: Payer: Self-pay | Admitting: *Deleted

## 2015-10-01 ENCOUNTER — Encounter: Payer: Self-pay | Admitting: *Deleted

## 2015-10-10 ENCOUNTER — Encounter: Payer: Self-pay | Admitting: *Deleted

## 2015-10-10 ENCOUNTER — Telehealth: Payer: Self-pay | Admitting: Neurology

## 2015-10-10 NOTE — Telephone Encounter (Signed)
Alvino Chapel with [facility] in [place] is calling regarding a Rx for modafinil (PROVIGIL) 200 MG tablet that the patient brought in yesterday to be filled. It is a written Rx but the pharmacy says it is a Ambulance person. Please call and discuss. Thank you.

## 2015-10-10 NOTE — Telephone Encounter (Signed)
I have spoken with Alvino Chapel at [facility]. rx. pt. dropped off is clearly a photocopy, with void showing on the rx. since it is copied.  It was written 09-19-15.  I have advised pt. was in the office that day and did receive a rx.  She is not a pt. who has exhibited drug seeking behavior, so I feel like there must be an explanation.  Alvino Chapel will call the pt. and request she bring in original rx.; will call us back if needed/fim

## 2015-10-18 ENCOUNTER — Other Ambulatory Visit: Payer: Self-pay | Admitting: Hematology and Oncology

## 2015-10-18 ENCOUNTER — Inpatient Hospital Stay: Payer: 59 | Attending: Hematology and Oncology

## 2015-10-18 DIAGNOSIS — Z79899 Other long term (current) drug therapy: Secondary | ICD-10-CM | POA: Diagnosis not present

## 2015-10-18 DIAGNOSIS — E538 Deficiency of other specified B group vitamins: Secondary | ICD-10-CM | POA: Diagnosis not present

## 2015-10-18 MED ORDER — CYANOCOBALAMIN 1000 MCG/ML IJ SOLN
1000.0000 ug | Freq: Once | INTRAMUSCULAR | Status: AC
Start: 2015-10-18 — End: 2015-10-18
  Administered 2015-10-18: 1000 ug via INTRAMUSCULAR
  Filled 2015-10-18: qty 1

## 2015-10-22 ENCOUNTER — Encounter: Payer: Self-pay | Admitting: *Deleted

## 2015-11-12 ENCOUNTER — Inpatient Hospital Stay: Payer: 59 | Attending: Hematology and Oncology

## 2015-11-12 DIAGNOSIS — Z79899 Other long term (current) drug therapy: Secondary | ICD-10-CM | POA: Diagnosis not present

## 2015-11-12 DIAGNOSIS — G35 Multiple sclerosis: Secondary | ICD-10-CM | POA: Diagnosis not present

## 2015-11-12 DIAGNOSIS — I10 Essential (primary) hypertension: Secondary | ICD-10-CM | POA: Diagnosis not present

## 2015-11-12 DIAGNOSIS — Z9884 Bariatric surgery status: Secondary | ICD-10-CM | POA: Diagnosis not present

## 2015-11-12 DIAGNOSIS — E559 Vitamin D deficiency, unspecified: Secondary | ICD-10-CM | POA: Insufficient documentation

## 2015-11-12 DIAGNOSIS — D509 Iron deficiency anemia, unspecified: Secondary | ICD-10-CM | POA: Insufficient documentation

## 2015-11-12 DIAGNOSIS — R5383 Other fatigue: Secondary | ICD-10-CM | POA: Diagnosis not present

## 2015-11-12 DIAGNOSIS — E538 Deficiency of other specified B group vitamins: Secondary | ICD-10-CM | POA: Diagnosis not present

## 2015-11-12 DIAGNOSIS — D649 Anemia, unspecified: Secondary | ICD-10-CM | POA: Diagnosis not present

## 2015-11-12 LAB — CBC WITH DIFFERENTIAL/PLATELET
Basophils Absolute: 0 10*3/uL (ref 0–0.1)
Basophils Relative: 1 %
Eosinophils Absolute: 0 10*3/uL (ref 0–0.7)
Eosinophils Relative: 0 %
HCT: 38.2 % (ref 35.0–47.0)
Hemoglobin: 13 g/dL (ref 12.0–16.0)
Lymphocytes Relative: 5 %
Lymphs Abs: 0.2 10*3/uL — ABNORMAL LOW (ref 1.0–3.6)
MCH: 29.9 pg (ref 26.0–34.0)
MCHC: 34 g/dL (ref 32.0–36.0)
MCV: 87.9 fL (ref 80.0–100.0)
Monocytes Absolute: 0.5 10*3/uL (ref 0.2–0.9)
Monocytes Relative: 14 %
Neutro Abs: 2.7 10*3/uL (ref 1.4–6.5)
Neutrophils Relative %: 80 %
Platelets: 181 10*3/uL (ref 150–440)
RBC: 4.34 MIL/uL (ref 3.80–5.20)
RDW: 13.4 % (ref 11.5–14.5)
WBC: 3.3 10*3/uL — ABNORMAL LOW (ref 3.6–11.0)

## 2015-11-12 LAB — FERRITIN: Ferritin: 20 ng/mL (ref 11–307)

## 2015-11-15 ENCOUNTER — Other Ambulatory Visit: Payer: 59

## 2015-11-15 ENCOUNTER — Other Ambulatory Visit: Payer: Self-pay | Admitting: Hematology and Oncology

## 2015-11-15 ENCOUNTER — Inpatient Hospital Stay: Payer: 59

## 2015-11-15 ENCOUNTER — Other Ambulatory Visit: Payer: Self-pay

## 2015-11-15 ENCOUNTER — Encounter: Payer: Self-pay | Admitting: Hematology and Oncology

## 2015-11-15 ENCOUNTER — Inpatient Hospital Stay (HOSPITAL_BASED_OUTPATIENT_CLINIC_OR_DEPARTMENT_OTHER): Payer: 59 | Admitting: Hematology and Oncology

## 2015-11-15 ENCOUNTER — Ambulatory Visit: Payer: 59

## 2015-11-15 VITALS — BP 132/90 | HR 69 | Temp 97.9°F | Resp 18 | Wt 245.6 lb

## 2015-11-15 DIAGNOSIS — Z79899 Other long term (current) drug therapy: Secondary | ICD-10-CM | POA: Diagnosis not present

## 2015-11-15 DIAGNOSIS — G35 Multiple sclerosis: Secondary | ICD-10-CM

## 2015-11-15 DIAGNOSIS — R5383 Other fatigue: Secondary | ICD-10-CM | POA: Diagnosis not present

## 2015-11-15 DIAGNOSIS — E538 Deficiency of other specified B group vitamins: Secondary | ICD-10-CM

## 2015-11-15 DIAGNOSIS — D509 Iron deficiency anemia, unspecified: Secondary | ICD-10-CM | POA: Diagnosis not present

## 2015-11-15 DIAGNOSIS — Z9884 Bariatric surgery status: Secondary | ICD-10-CM

## 2015-11-15 DIAGNOSIS — D649 Anemia, unspecified: Secondary | ICD-10-CM

## 2015-11-15 DIAGNOSIS — I10 Essential (primary) hypertension: Secondary | ICD-10-CM

## 2015-11-15 DIAGNOSIS — E559 Vitamin D deficiency, unspecified: Secondary | ICD-10-CM

## 2015-11-15 MED ORDER — CYANOCOBALAMIN 1000 MCG/ML IJ SOLN
1000.0000 ug | Freq: Once | INTRAMUSCULAR | Status: AC
Start: 2015-11-15 — End: 2015-11-15
  Administered 2015-11-15: 1000 ug via INTRAMUSCULAR

## 2015-11-15 NOTE — Progress Notes (Signed)
Pt offers no complaints today.  

## 2015-11-15 NOTE — Progress Notes (Signed)
[facility]-  Cancer Center  Clinic day:  11/15/2015   Chief Complaint: URIJAH Lee is a 45 y.o. female status post gastric bypass surgery and subsequent iron deficiency and B12 deficiency who is seen for 5 month assessment.  HPI: The patient was last seen in the medical oncology clinic on 05/31/2015.  At that time, she had mild fatigue.  Hematocrit was 37.4, hemoglobin 12.3 with an MCV of 88.  Ferritin was 10.  She received the influenza vaccine.  She received Venofer 200 mg IV on 05/31/2015.  She has received B12 monthly (05/31/2015, 06/28/2015, 07/26/2015, 08/22/2014, 09/18/2014, and 10/18/2015).  B12 was 274 (low normal) on 03/25/2015.  MMA was 254 (normal) on 08/21/2015.  CBC on 08/21/2015 revealed a hematocrit of 38.9, hemoglobin 12.9, MCV 88.9, platelets 213,000, WBC 2800 with an ANC of 2200.  Ferritin was 61.    During the interim, she has done well.  Energy level is fairly good.  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  She denies any melena or hematochezia.   Past Medical History  Diagnosis Date  . Hypertension   . Anemia   . MS (multiple sclerosis) (HCC)   . Vitamin B 12 deficiency   . Vitamin D deficiency   . Vision abnormalities     Past Surgical History  Procedure Laterality Date  . Gastric bypass    . Cholecystectomy      Family History  Problem Relation Age of Onset  . Stroke Mother   . Heart disease Mother   . Asthma Mother   . Hypertension Father   . Heart disease Father   . Diabetes Father   . Asthma Brother   . Alcoholism Brother   . Obesity Brother   . Heart disease Brother     Social History:  reports that she has never smoked. She does not have any smokeless tobacco history on file. She reports that she does not drink alcohol. Her drug history is not on file.  She recently moved from an apartment into a house.  She is traveling to [place] tomorrow to see her new 6 week grandson.  The patient is  alone today.  Allergies:  Allergies  Allergen Reactions  . Nsaids Other (See Comments)    Not supposed to take d/t bariatric surgery   . Ciprofloxacin Rash    Current Medications: Current Outpatient Prescriptions  Medication Sig Dispense Refill  . acetaminophen (TYLENOL) 325 MG tablet Take 650 mg by mouth every 4 (four) hours as needed.    . benzonatate (TESSALON) 100 MG capsule Take 1 capsule (100 mg total) by mouth 3 (three) times daily as needed for cough. 20 capsule 0  . calcium-vitamin D (OSCAL WITH D) 500-200 MG-UNIT per tablet Take 1 tablet by mouth daily with breakfast. Reported on 09/19/2015    . diclofenac sodium (VOLTAREN) 1 % GEL Apply 1 application topically 2 (two) times daily as needed. 100 g 1  . folic acid (FOLVITE) 1 MG tablet Take 2 mg by mouth daily.    Marland Kitchen GILENYA 0.5 MG CAPS Take 1 capsule (0.5 mg total) by mouth daily. 90 capsule 3  . hydrochlorothiazide (MICROZIDE) 12.5 MG capsule Take 12.5 mg by mouth daily.     Marland Kitchen lisinopril (PRINIVIL,ZESTRIL) 40 MG tablet Take 40 mg by mouth daily.    . Magnesium 400 MG TABS Take 1 tablet by mouth at bedtime as needed. 30 tablet 0  . MedroxyPROGESTERone Acetate  150 MG/ML SUSY Inject 1 mL into the skin every 3 (three) months.     . modafinil (PROVIGIL) 200 MG tablet Take 1 tablet (200 mg total) by mouth daily. 30 tablet 5  . Multiple Vitamin (MULTI-VITAMINS) TABS Take 1 tablet by mouth daily.      No current facility-administered medications for this visit.    Review of Systems:  GENERAL:  Feels pretty good.  No fevers, sweats or weight loss. PERFORMANCE STATUS (ECOG):  1 HEENT:  No visual changes, runny nose, sore throat, mouth sores or tenderness. Lungs: No shortness of breath or cough.  No hemoptysis. Cardiac:  No chest pain, palpitations, orthopnea, or PND. GI:  No nausea, vomiting, diarrhea, constipation, melena or hematochezia. GU:  Spotting.  Normal periods.  Received Depot injection.  No urgency, frequency, dysuria, or  hematuria. Musculoskeletal:  No pain or muscle tenderness. Extremities:  No pain or swelling. Skin:  No rashes or skin changes. Neuro:  Peripheral neuropathy.  No headache, numbness or weakness, balance or coordination issues. Endocrine:  No diabetes, thyroid issues, hot flashes or night sweats. Psych:  No mood changes, depression or anxiety. Pain:  No focal pain. Review of systems:  All other systems reviewed and found to be negative.   Physical Exam: Blood pressure 132/90, pulse 69, temperature 97.9 F (36.6 C), temperature source Tympanic, resp. rate 18, weight 245 lb 9.5 oz (111.4 kg). GENERAL:  Well developed, well nourished, sitting comfortably in the exam room in no acute distress. MENTAL STATUS:  Alert and oriented to person, place and time. HEAD:  Long brown hair pulled back.  Normocephalic, atraumatic, face symmetric, no Cushingoid features. EYES:  Glasses.  Blue eyes.  Pupils equal round and reactive to light and accomodation.  No conjunctivitis or scleral icterus. ENT:  Oropharynx clear without lesion.  Dentures.  Tongue normal. Mucous membranes moist.  RESPIRATORY:  Clear to auscultation without rales, wheezes or rhonchi. CARDIOVASCULAR:  Regular rate and rhythm without murmur, rub or gallop. ABDOMEN:  Fully round.  Soft, non-tender, with active bowel sounds, and no appreciable hepatosplenomegaly.  No masses. SKIN:  Abdominal striae.  No rashes, ulcers or lesions. EXTREMITIES: No edema, no skin discoloration or tenderness.  No palpable cords. LYMPH NODES: No palpable cervical, supraclavicular, axillary or inguinal adenopathy  NEUROLOGICAL: Unremarkable. PSYCH:  Appropriate.  No visits with results within 3 Day(s) from this visit. Latest known visit with results is:  Clinical Support on 11/12/2015  Component Date Value Ref Range Status  . WBC 11/12/2015 3.3* 3.6 - 11.0 K/uL Final  . RBC 11/12/2015 4.34  3.80 - 5.20 MIL/uL Final  . Hemoglobin 11/12/2015 13.0  12.0 - 16.0  g/dL Final  . HCT 16/05/9603 38.2  35.0 - 47.0 % Final  . MCV 11/12/2015 87.9  80.0 - 100.0 fL Final  . MCH 11/12/2015 29.9  26.0 - 34.0 pg Final  . MCHC 11/12/2015 34.0  32.0 - 36.0 g/dL Final  . RDW 54/04/8118 13.4  11.5 - 14.5 % Final  . Platelets 11/12/2015 181  150 - 440 K/uL Final  . Neutrophils Relative % 11/12/2015 80   Final  . Neutro Abs 11/12/2015 2.7  1.4 - 6.5 K/uL Final  . Lymphocytes Relative 11/12/2015 5   Final  . Lymphs Abs 11/12/2015 0.2* 1.0 - 3.6 K/uL Final  . Monocytes Relative 11/12/2015 14   Final  . Monocytes Absolute 11/12/2015 0.5  0.2 - 0.9 K/uL Final  . Eosinophils Relative 11/12/2015 0   Final  . Eosinophils  Absolute 11/12/2015 0.0  0 - 0.7 K/uL Final  . Basophils Relative 11/12/2015 1   Final  . Basophils Absolute 11/12/2015 0.0  0 - 0.1 K/uL Final  . Ferritin 11/12/2015 20  11 - 307 ng/mL Final    Assessment:  Jody Lee is a 45 y.o. female  status post gastric bypass surgery (2003) with resultant iron deficiency anemia and B12 deficiency.  She is intolerant of oral iron.  She receives Venofer when her iron stores are low (last 05/30/2016).  She becomes symptomatic when her ferritin is < 30.   She was initially treated with B12 IM, but has subsequently been on oral B12.  B12 was sub-therapeutic on 06/27/2014.  She received weekly B12 x 6 (01/04/2015-02/22/2015) followed by monthly B12 (last 10/18/2015).  Folic acid was normal (12.5) on 01/25/2015.  She takes daily folic acid.    She has multiple sclerosis.    Symptomatically, she feels pretty good.  Exam is stable.  Ferritin is 20 (low).  Plan: 1.  Review recent labs.. Discuss decreasing iron stores (61 to 20 in past 3 months).  Discuss normal hematocrit.  Anticipate further decline in iron stores and associated symptoms in next 3 months based on past history.  Patient would like to receive Venofer in 2 weeks after her trip. 2.  B12 today and monthly. 3.  RTC on 11/29/2015 for Venofer 200 mg IV. 4.   RTC in 3 months for labs (CBC with diff, ferritin) and MD appointment next day +/- Venofer.   Rosey Bath, MD  11/15/2015, 10:38 AM

## 2015-11-29 ENCOUNTER — Inpatient Hospital Stay: Payer: 59

## 2015-11-29 VITALS — BP 132/75 | HR 72 | Resp 18

## 2015-11-29 DIAGNOSIS — E538 Deficiency of other specified B group vitamins: Secondary | ICD-10-CM

## 2015-11-29 DIAGNOSIS — D509 Iron deficiency anemia, unspecified: Secondary | ICD-10-CM | POA: Diagnosis not present

## 2015-11-29 MED ORDER — SODIUM CHLORIDE 0.9 % IV SOLN
200.0000 mg | Freq: Once | INTRAVENOUS | Status: AC
  Administered 2015-11-29: 200 mg via INTRAVENOUS
  Filled 2015-11-29: qty 10

## 2015-11-29 MED ORDER — SODIUM CHLORIDE 0.9 % IV SOLN
Freq: Once | INTRAVENOUS | Status: AC
  Administered 2015-11-29: 09:00:00 via INTRAVENOUS
  Filled 2015-11-29: qty 1000

## 2015-12-13 ENCOUNTER — Inpatient Hospital Stay: Payer: 59 | Attending: Hematology and Oncology

## 2015-12-18 ENCOUNTER — Ambulatory Visit (INDEPENDENT_AMBULATORY_CARE_PROVIDER_SITE_OTHER): Payer: 59

## 2015-12-18 DIAGNOSIS — R269 Unspecified abnormalities of gait and mobility: Secondary | ICD-10-CM | POA: Diagnosis not present

## 2015-12-18 DIAGNOSIS — G35 Multiple sclerosis: Secondary | ICD-10-CM

## 2015-12-18 MED ORDER — GADOPENTETATE DIMEGLUMINE 469.01 MG/ML IV SOLN: 20.0000 mL | Freq: Once | INTRAVENOUS | Status: DC | PRN

## 2015-12-20 ENCOUNTER — Telehealth: Payer: Self-pay | Admitting: *Deleted

## 2015-12-20 NOTE — Telephone Encounter (Signed)
I have spoken with Jody Lee and per RAS, adivsed that mri brain shows no new lesions when compared to 2105 mri brain.  She verbalized understanding of same/fim

## 2015-12-20 NOTE — Telephone Encounter (Signed)
-----   Message from Asa Lente, MD sent at 12/18/2015  7:53 PM EDT ----- Please let her know that the MRI of the brain does not show any new lesions since the MRI from 2015.

## 2015-12-24 ENCOUNTER — Other Ambulatory Visit: Payer: Self-pay | Admitting: Obstetrics & Gynecology

## 2015-12-24 DIAGNOSIS — Z1231 Encounter for screening mammogram for malignant neoplasm of breast: Secondary | ICD-10-CM

## 2016-01-10 ENCOUNTER — Inpatient Hospital Stay: Payer: 59 | Attending: Hematology and Oncology

## 2016-01-10 ENCOUNTER — Encounter (INDEPENDENT_AMBULATORY_CARE_PROVIDER_SITE_OTHER): Payer: Self-pay

## 2016-01-10 DIAGNOSIS — Z79899 Other long term (current) drug therapy: Secondary | ICD-10-CM | POA: Insufficient documentation

## 2016-01-10 DIAGNOSIS — E538 Deficiency of other specified B group vitamins: Secondary | ICD-10-CM | POA: Insufficient documentation

## 2016-01-10 MED ORDER — CYANOCOBALAMIN 1000 MCG/ML IJ SOLN
1000.0000 ug | Freq: Once | INTRAMUSCULAR | Status: AC
  Administered 2016-01-10: 1000 ug via INTRAMUSCULAR
  Filled 2016-01-10: qty 1

## 2016-01-24 ENCOUNTER — Other Ambulatory Visit: Payer: Self-pay | Admitting: Obstetrics & Gynecology

## 2016-01-24 ENCOUNTER — Ambulatory Visit
Admission: RE | Admit: 2016-01-24 | Discharge: 2016-01-24 | Disposition: A | Discharge status: Home / Self Care | Payer: 59 | Source: Ambulatory Visit | Attending: Obstetrics & Gynecology | Admitting: Obstetrics & Gynecology

## 2016-01-24 DIAGNOSIS — Z1231 Encounter for screening mammogram for malignant neoplasm of breast: Secondary | ICD-10-CM | POA: Diagnosis present

## 2016-02-05 DIAGNOSIS — Z6841 Body Mass Index (BMI) 40.0 and over, adult: Secondary | ICD-10-CM | POA: Insufficient documentation

## 2016-02-07 ENCOUNTER — Other Ambulatory Visit: Payer: Self-pay | Admitting: Family Medicine

## 2016-02-12 ENCOUNTER — Inpatient Hospital Stay: Payer: 59 | Attending: Hematology and Oncology

## 2016-02-12 DIAGNOSIS — Z9884 Bariatric surgery status: Secondary | ICD-10-CM | POA: Insufficient documentation

## 2016-02-12 DIAGNOSIS — E538 Deficiency of other specified B group vitamins: Secondary | ICD-10-CM | POA: Diagnosis not present

## 2016-02-12 DIAGNOSIS — M838 Other adult osteomalacia: Secondary | ICD-10-CM | POA: Diagnosis not present

## 2016-02-12 DIAGNOSIS — I10 Essential (primary) hypertension: Secondary | ICD-10-CM | POA: Insufficient documentation

## 2016-02-12 DIAGNOSIS — Z79899 Other long term (current) drug therapy: Secondary | ICD-10-CM | POA: Diagnosis not present

## 2016-02-12 DIAGNOSIS — G35 Multiple sclerosis: Secondary | ICD-10-CM | POA: Diagnosis not present

## 2016-02-12 DIAGNOSIS — D509 Iron deficiency anemia, unspecified: Secondary | ICD-10-CM | POA: Insufficient documentation

## 2016-02-12 LAB — CBC WITH DIFFERENTIAL/PLATELET
Basophils Absolute: 0 10*3/uL (ref 0–0.1)
Basophils Relative: 1 %
Eosinophils Absolute: 0 10*3/uL (ref 0–0.7)
Eosinophils Relative: 0 %
HCT: 39 % (ref 35.0–47.0)
Hemoglobin: 13.3 g/dL (ref 12.0–16.0)
Lymphocytes Relative: 5 %
Lymphs Abs: 0.2 10*3/uL — ABNORMAL LOW (ref 1.0–3.6)
MCH: 29.8 pg (ref 26.0–34.0)
MCHC: 34.1 g/dL (ref 32.0–36.0)
MCV: 87.3 fL (ref 80.0–100.0)
Monocytes Absolute: 0.4 10*3/uL (ref 0.2–0.9)
Monocytes Relative: 9 %
Neutro Abs: 3.3 10*3/uL (ref 1.4–6.5)
Neutrophils Relative %: 85 %
Platelets: 212 10*3/uL (ref 150–440)
RBC: 4.46 MIL/uL (ref 3.80–5.20)
RDW: 13.9 % (ref 11.5–14.5)
WBC: 3.9 10*3/uL (ref 3.6–11.0)

## 2016-02-12 LAB — FERRITIN: Ferritin: 56 ng/mL (ref 11–307)

## 2016-02-13 ENCOUNTER — Other Ambulatory Visit: Payer: Self-pay | Admitting: Hematology and Oncology

## 2016-02-14 ENCOUNTER — Inpatient Hospital Stay: Payer: 59

## 2016-02-14 ENCOUNTER — Other Ambulatory Visit: Payer: 59

## 2016-02-14 ENCOUNTER — Encounter: Payer: Self-pay | Admitting: Hematology and Oncology

## 2016-02-14 ENCOUNTER — Inpatient Hospital Stay (HOSPITAL_BASED_OUTPATIENT_CLINIC_OR_DEPARTMENT_OTHER): Payer: 59 | Admitting: Hematology and Oncology

## 2016-02-14 VITALS — BP 139/82 | HR 73 | Temp 98.1°F | Ht 64.0 in | Wt 254.0 lb

## 2016-02-14 DIAGNOSIS — Z79899 Other long term (current) drug therapy: Secondary | ICD-10-CM | POA: Diagnosis not present

## 2016-02-14 DIAGNOSIS — E538 Deficiency of other specified B group vitamins: Secondary | ICD-10-CM

## 2016-02-14 DIAGNOSIS — Z9884 Bariatric surgery status: Secondary | ICD-10-CM

## 2016-02-14 DIAGNOSIS — D508 Other iron deficiency anemias: Secondary | ICD-10-CM

## 2016-02-14 DIAGNOSIS — G35 Multiple sclerosis: Secondary | ICD-10-CM | POA: Diagnosis not present

## 2016-02-14 DIAGNOSIS — E559 Vitamin D deficiency, unspecified: Secondary | ICD-10-CM

## 2016-02-14 DIAGNOSIS — D509 Iron deficiency anemia, unspecified: Secondary | ICD-10-CM | POA: Diagnosis not present

## 2016-02-14 DIAGNOSIS — I10 Essential (primary) hypertension: Secondary | ICD-10-CM

## 2016-02-14 MED ORDER — CYANOCOBALAMIN 1000 MCG/ML IJ SOLN
1000.0000 ug | Freq: Once | INTRAMUSCULAR | Status: AC
  Administered 2016-02-14: 1000 ug via INTRAMUSCULAR
  Filled 2016-02-14: qty 1

## 2016-02-14 NOTE — Progress Notes (Signed)
Patient here for follow up and treatment. Patient states she is more tired than usual.

## 2016-02-14 NOTE — Progress Notes (Signed)
[facility]  Clinic day:  02/14/2016   Chief Complaint: Jody Lee is a 45 y.o. female status post gastric bypass surgery and subsequent iron deficiency and B12 deficiency who is seen for 3 month assessment.  HPI: The patient was last seen in the medical oncology clinic on 11/15/2015.  At that time, she felt pretty good.  Exam was stable.  Ferritin was 20 (low).  She received Venofer on 11/29/2015.  She has continued to receive monthly B12 (last 01/10/2016).  Labs on 02/12/2016 included a hematocrit of 39, hemoglobin 13.3, platelets 212,000, white count 3900. Ferritin was 56.  Symptomatically, notes that her energy level waxes and wanes.  She denies any chest pain or shortness of breath. She denies any bleeding.   Past Medical History  Diagnosis Date  . Hypertension   . Anemia   . MS (multiple sclerosis) (HCC)   . Vitamin B 12 deficiency   . Vitamin D deficiency   . Vision abnormalities     Past Surgical History  Procedure Laterality Date  . Gastric bypass    . Cholecystectomy      Family History  Problem Relation Age of Onset  . Stroke Mother   . Heart disease Mother   . Asthma Mother   . Hypertension Father   . Heart disease Father   . Diabetes Father   . Asthma Brother   . Alcoholism Brother   . Obesity Brother   . Heart disease Brother   . Breast cancer Neg Hx     Social History:  reports that she has never smoked. She does not have any smokeless tobacco history on file. She reports that she does not drink alcohol. Her drug history is not on file.  She recently moved from an apartment into a house.  The patient is alone today.  Allergies:  Allergies  Allergen Reactions  . Nsaids Other (See Comments)    Not supposed to take d/t bariatric surgery   . Ciprofloxacin Rash    Current Medications: Current Outpatient Prescriptions  Medication Sig Dispense Refill  . acetaminophen (TYLENOL) 325 MG tablet Take 650 mg by  mouth every 4 (four) hours as needed.    . calcium-vitamin D (OSCAL WITH D) 500-200 MG-UNIT per tablet Take 1 tablet by mouth daily with breakfast. Reported on 09/19/2015    . diclofenac sodium (VOLTAREN) 1 % GEL Apply 1 application topically 2 (two) times daily as needed. 100 g 1  . folic acid (FOLVITE) 1 MG tablet Take 2 mg by mouth daily.    Marland Kitchen GILENYA 0.5 MG CAPS Take 1 capsule (0.5 mg total) by mouth daily. 90 capsule 3  . hydrochlorothiazide (MICROZIDE) 12.5 MG capsule Take 12.5 mg by mouth daily.     Marland Kitchen lisinopril (PRINIVIL,ZESTRIL) 40 MG tablet TAKE 1 TABLET DAILY 90 tablet 2  . Magnesium 400 MG TABS Take 1 tablet by mouth at bedtime as needed. 30 tablet 0  . MedroxyPROGESTERone Acetate 150 MG/ML SUSY Inject 1 mL into the skin every 3 (three) months.     . modafinil (PROVIGIL) 200 MG tablet Take 1 tablet (200 mg total) by mouth daily. 30 tablet 5  . Multiple Vitamin (MULTI-VITAMINS) TABS Take 1 tablet by mouth daily.      No current facility-administered medications for this visit.   Facility-Administered Medications Ordered in Other Visits  Medication Dose Route Frequency Provider Last Rate Last Dose  . gadopentetate dimeglumine (MAGNEVIST) injection 20 mL  20 mL  Intravenous Once PRN Asa Lente, MD        Review of Systems:  GENERAL:  Feels "ok".  Energy level waxes and wanes.  No fevers or sweats.  Weight gain of 8 pounds. PERFORMANCE STATUS (ECOG):  1 HEENT:  No visual changes, runny nose, sore throat, mouth sores or tenderness. Lungs: No shortness of breath or cough.  No hemoptysis. Cardiac:  No chest pain, palpitations, orthopnea, or PND. GI:  No nausea, vomiting, diarrhea, constipation, melena or hematochezia. GU:  No urgency, frequency, dysuria, or hematuria. Musculoskeletal:  No pain or muscle tenderness. Extremities:  No pain or swelling. Skin:  No rashes or skin changes. Neuro:  Peripheral neuropathy.  No headache, numbness or weakness, balance or coordination  issues. Endocrine:  No diabetes, thyroid issues, hot flashes or night sweats. Psych:  No mood changes, depression or anxiety. Pain:  No focal pain. Review of systems:  All other systems reviewed and found to be negative.   Physical Exam: Blood pressure 139/82, pulse 73, temperature 98.1 F (36.7 C), temperature source Tympanic, height  (1.626 m), weight 253 lb 15.5 oz (115.2 kg). GENERAL:  Well developed, well nourished, heavyset woman sitting comfortably in the exam room in no acute distress. MENTAL STATUS:  Alert and oriented to person, place and time. HEAD:  Short brown hair.  Normocephalic, atraumatic, face symmetric, no Cushingoid features. EYES:  Glasses.  Blue eyes.  Pupils equal round and reactive to light and accomodation.  No conjunctivitis or scleral icterus. ENT:  Oropharynx clear without lesion.  Dentures.  Tongue normal. Mucous membranes moist.  RESPIRATORY:  Clear to auscultation without rales, wheezes or rhonchi. CARDIOVASCULAR:  Regular rate and rhythm without murmur, rub or gallop. ABDOMEN:  Fully round.  Soft, non-tender, with active bowel sounds, and no appreciable hepatosplenomegaly.  No masses. SKIN:  Abdominal striae.  No rashes, ulcers or lesions. EXTREMITIES: No edema, no skin discoloration or tenderness.  No palpable cords. LYMPH NODES: No palpable cervical, supraclavicular, axillary or inguinal adenopathy  NEUROLOGICAL: Unremarkable. PSYCH:  Appropriate.   Appointment on 02/12/2016  Component Date Value Ref Range Status  . WBC 02/12/2016 3.9  3.6 - 11.0 K/uL Final  . RBC 02/12/2016 4.46  3.80 - 5.20 MIL/uL Final  . Hemoglobin 02/12/2016 13.3  12.0 - 16.0 g/dL Final  . HCT 81/19/1478 39.0  35.0 - 47.0 % Final  . MCV 02/12/2016 87.3  80.0 - 100.0 fL Final  . MCH 02/12/2016 29.8  26.0 - 34.0 pg Final  . MCHC 02/12/2016 34.1  32.0 - 36.0 g/dL Final  . RDW 29/56/2130 13.9  11.5 - 14.5 % Final  . Platelets 02/12/2016 212  150 - 440 K/uL Final  .  Neutrophils Relative % 02/12/2016 85   Final  . Neutro Abs 02/12/2016 3.3  1.4 - 6.5 K/uL Final  . Lymphocytes Relative 02/12/2016 5   Final  . Lymphs Abs 02/12/2016 0.2* 1.0 - 3.6 K/uL Final  . Monocytes Relative 02/12/2016 9   Final  . Monocytes Absolute 02/12/2016 0.4  0.2 - 0.9 K/uL Final  . Eosinophils Relative 02/12/2016 0   Final  . Eosinophils Absolute 02/12/2016 0.0  0 - 0.7 K/uL Final  . Basophils Relative 02/12/2016 1   Final  . Basophils Absolute 02/12/2016 0.0  0 - 0.1 K/uL Final  . Ferritin 02/12/2016 56  11 - 307 ng/mL Final    Assessment:  Jody Lee is a 45 y.o. female  status post gastric bypass surgery (2003) with  resultant iron deficiency anemia and B12 deficiency.  She is intolerant of oral iron.  She receives Venofer when her iron stores are low (last 11/29/2015).  She becomes symptomatic when her ferritin is < 30.   She was initially treated with B12 IM, but has subsequently been on oral B12.  B12 was sub-therapeutic on 06/27/2014.  She received weekly B12 x 6 (01/04/2015-02/22/2015) followed by monthly B12 (last 01/10/2016).  Folic acid was normal (12.5) on 01/25/2015.  She takes daily folic acid.    She has multiple sclerosis.    Symptomatically, her energy level waxes and wanes.  Exam is stable.  Ferritin is 56.  Plan: 1.  Review labs from 02/12/2016.  Iron stores adequate.  Continue to monitor.  Discuss Venofer when ferritin < 30. 2.  B12 today and monthly. 3.  RTC in 6 weeks for labs (CBC with diff, ferritin). 4.  RTC in 3 months for MD assessment and labs (CBC with diff, ferritin, , iron studies- day before), and +/- Venofer.   Rosey Bath, MD  02/14/2016, 2:54 PM

## 2016-03-16 ENCOUNTER — Ambulatory Visit: Payer: 59

## 2016-03-20 ENCOUNTER — Encounter: Payer: Self-pay | Admitting: Neurology

## 2016-03-20 ENCOUNTER — Inpatient Hospital Stay: Payer: 59

## 2016-03-20 ENCOUNTER — Ambulatory Visit: Payer: 59

## 2016-03-20 ENCOUNTER — Ambulatory Visit (INDEPENDENT_AMBULATORY_CARE_PROVIDER_SITE_OTHER): Payer: 59 | Admitting: Neurology

## 2016-03-20 VITALS — BP 156/92 | HR 70 | Ht 64.0 in | Wt 257.0 lb

## 2016-03-20 DIAGNOSIS — R5383 Other fatigue: Secondary | ICD-10-CM

## 2016-03-20 DIAGNOSIS — R269 Unspecified abnormalities of gait and mobility: Secondary | ICD-10-CM | POA: Diagnosis not present

## 2016-03-20 DIAGNOSIS — G35 Multiple sclerosis: Secondary | ICD-10-CM | POA: Diagnosis not present

## 2016-03-20 DIAGNOSIS — R350 Frequency of micturition: Secondary | ICD-10-CM | POA: Diagnosis not present

## 2016-03-20 DIAGNOSIS — G35D Multiple sclerosis, unspecified: Secondary | ICD-10-CM

## 2016-03-20 DIAGNOSIS — R200 Anesthesia of skin: Secondary | ICD-10-CM

## 2016-03-20 MED ORDER — BACLOFEN 10 MG PO TABS: 10.0000 mg | ORAL_TABLET | Freq: Three times a day (TID) | ORAL | 0 refills | Status: DC

## 2016-03-20 MED ORDER — OXYBUTYNIN CHLORIDE 5 MG PO TABS: 5.0000 mg | ORAL_TABLET | Freq: Three times a day (TID) | ORAL | 11 refills | Status: DC

## 2016-03-20 MED ORDER — MODAFINIL 200 MG PO TABS
200.0000 mg | ORAL_TABLET | Freq: Every day | ORAL | 5 refills | Status: DC
Start: 2016-03-20 — End: 2016-10-21

## 2016-03-20 MED ORDER — GILENYA 0.5 MG PO CAPS: 1.0000 | ORAL_CAPSULE | Freq: Every day | ORAL | 3 refills | Status: DC

## 2016-03-20 NOTE — Progress Notes (Signed)
[facility]  PATIENT: Jody Lee DOB: 10-Mar-1971  REFERRING DOCTOR OR PCP:  Venora Maples SOURCE: patient and labs, notes in EMR and MRI on PACS  _________________________________   HISTORICAL  CHIEF COMPLAINT:  Chief Complaint  Patient presents with  . Multiple Sclerosis    Sts. she continues to tolerate Gilenya well.  She c/o worsening leg spasms and also bladder spasms/fim    HISTORY OF PRESENT ILLNESS:  Jody Lee is a 45 year old woman with MS diagnosed in 2005.   She feels her MS is mostly stable.    She feels cognition has done better.   She has noted more leg spasms She is on Gilenya and she tolerates it well. She has not had any definite exacerbation while on it.    Gait/strength/sensation:  She has occasional stumbles but only rare falls.    She sometimes trips over her right foot.     Sometimes she veers while walking.  She enjoys bowling.   She denies any major problem with weakness or numbness. She has more trouble using a can opener and occasionally is dropping items.     She gets cramps in her legs, mostly at night .   These can be painful.     Bladder: She notes worsening urinary frequency and urgency. She does not have incontinence.     Vision: She denies any MS related visual problems.   She has new glasses  Fatigue/sleep: Her bigger problems is that she has difficulty with both physical and cognitive fatigue. She has noted a benefit with Provigil. She ran out a couple months ago and insurance has not authorized more.     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   Caffeine has much less benefit. Stimulants were poorly tolerated (mood).   She has some difficulty falling asleep but once asleep often stays asleep.    Often she has difficulty turning her mind off.   Her fianc has noted that she moans that her sleep sometimes. She has not had any screaming,  kicking or punching.  Mood/cognition: She denies any major difficulty with depression or anxiety at this time. She notes some cognitive difficulty. This is mostly problems with focusing and distraction and is improved with Provigil. She strays mentally active.  Other: Her anemia is doing better and she gets intermittent iron infusions and B12 monthly.   MS History:   She was diagnosed with MS in 2005 after presenting with a spinal cord syndrome. She started with numbness and pain in the feet that worked its way up to her abdomen over a couple days.   Initially, she had an MRI of the spine that showed a plaque consistent with her symptoms. She was referred to Dr. Maple Hudson. He did a lumbar puncture and ordered an MRI of the brain. Both were consistent with multiple sclerosis and I started to see her. Initially, she was placed on Betaseron and did very well for the first few years. Then around 2011 or 2012, she had an exacerbation and testing showed that she had developed antibodies against Betaseron. Therefore, she was switched to Gilenya at that time. She has been on Gilenya for the last 4+ years and has done well. Specifically, she has not reported any difficulties with exacerbations and she tolerates the medication well.   Her last MRI was performed at [facility] November 2015.  MRI of  the brain from 03/02/2013 showed  typical periventricular T2/FLAIR hyperintense foci, predominantly in the periventricular white matter.   There were no acute foci.   REVIEW OF SYSTEMS: Constitutional: No fevers, chills, sweats, or change in appetite.   She notes fatigue. Eyes: No visual changes, double vision, eye pain Ear, nose and throat: No hearing loss, ear pain, nasal congestion, sore throat Cardiovascular: No chest pain, palpitations Respiratory: No shortness of breath at rest or with exertion.   No wheezes GastrointestinaI: No nausea, vomiting, diarrhea, abdominal pain, fecal  incontinence Genitourinary: No dysuria, urinary retention or frequency.  No nocturia. Musculoskeletal: No neck pain, back pain Integumentary: No rash, pruritus, skin lesions Neurological: as above Psychiatric: No depression at this time.  No anxiety Endocrine: No palpitations, diaphoresis, change in appetite, change in weigh or increased thirst Hematologic/Lymphatic: No anemia, purpura, petechiae. Allergic/Immunologic: No itchy/runny eyes, nasal congestion, recent allergic reactions, rashes  ALLERGIES: Allergies  Allergen Reactions  . Nsaids Other (See Comments)    Not supposed to take d/t bariatric surgery   . Ciprofloxacin Rash    HOME MEDICATIONS:  Current Outpatient Prescriptions:  .  acetaminophen (TYLENOL) 325 MG tablet, Take 650 mg by mouth every 4 (four) hours as needed., Disp: , Rfl:  .  calcium-vitamin D (OSCAL WITH D) 500-200 MG-UNIT per tablet, Take 1 tablet by mouth daily with breakfast. Reported on 09/19/2015, Disp: , Rfl:  .  folic acid (FOLVITE) 1 MG tablet, Take 2 mg by mouth daily., Disp: , Rfl:  .  GILENYA 0.5 MG CAPS, Take 1 capsule (0.5 mg total) by mouth daily., Disp: 90 capsule, Rfl: 3 .  hydrochlorothiazide (MICROZIDE) 12.5 MG capsule, Take 12.5 mg by mouth daily. , Disp: , Rfl:  .  lisinopril (PRINIVIL,ZESTRIL) 40 MG tablet, TAKE 1 TABLET DAILY, Disp: 90 tablet, Rfl: 2 .  Magnesium 400 MG TABS, Take 1 tablet by mouth at bedtime as needed., Disp: 30 tablet, Rfl: 0 .  MedroxyPROGESTERone Acetate 150 MG/ML SUSY, Inject 1 mL into the skin every 3 (three) months. , Disp: , Rfl:  .  modafinil (PROVIGIL) 200 MG tablet, Take 1 tablet (200 mg total) by mouth daily., Disp: 30 tablet, Rfl: 5 .  Multiple Vitamin (MULTI-VITAMINS) TABS, Take 1 tablet by mouth daily. , Disp: , Rfl:  .  baclofen (LIORESAL) 10 MG tablet, Take 1 tablet (10 mg total) by mouth 3 (three) times daily., Disp: 30 each, Rfl: 0 .  oxybutynin (DITROPAN) 5 MG tablet, Take 1 tablet (5 mg total) by mouth  3 (three) times daily., Disp: 90 tablet, Rfl: 11 No current facility-administered medications for this visit.   Facility-Administered Medications Ordered in Other Visits:  .  gadopentetate dimeglumine (MAGNEVIST) injection 20 mL, 20 mL, Intravenous, Once PRN, Asa Lente, MD  PAST MEDICAL HISTORY: Past Medical History:  Diagnosis Date  . Anemia   . Hypertension   . MS (multiple sclerosis) (HCC)   . Vision abnormalities   . Vitamin B 12 deficiency   . Vitamin D deficiency     PAST SURGICAL HISTORY: Past Surgical History:  Procedure Laterality Date  . CHOLECYSTECTOMY    . GASTRIC BYPASS      FAMILY HISTORY: Family History  Problem Relation Age of Onset  . Stroke Mother   . Heart disease Mother   . Asthma Mother   . Hypertension Father   . Heart disease Father   . Diabetes Father   . Asthma Brother   . Alcoholism Brother   . Obesity Brother   .  Heart disease Brother   . Breast cancer Neg Hx     SOCIAL HISTORY:  Social History   Social History  . Marital status: Single    Spouse name: N/A  . Number of children: N/A  . Years of education: N/A   Occupational History  . Not on file.   Social History Main Topics  . Smoking status: Never Smoker  . Smokeless tobacco: Not on file  . Alcohol use No  . Drug use: Unknown  . Sexual activity: Not on file   Other Topics Concern  . Not on file   Social History Narrative  . No narrative on file     PHYSICAL EXAM  Vitals:   03/20/16 1056  BP: (!) 156/92  Pulse: 70  Weight: 257 lb (116.6 kg)  Height:  (1.626 m)    Body mass index is 44.11 kg/m.   General: The patient is well-developed and well-nourished and in no acute distress   Neck: The neck has good ROM  Neurologic Exam  Mental status: The patient is alert and oriented x 3 at the time of the examination. The patient has apparent normal recent and remote memory, with an apparently normal attention span and concentration ability.   Speech  is normal.  Cranial nerves: Extraocular movements are full.   There is good facial sensation to soft touch bilaterally.Facial strength is normal.  Trapezius and sternocleidomastoid strength is normal. No dysarthria is noted.  The tongue is midline, and the patient has symmetric elevation of the soft palate. No obvious hearing deficits are noted.  Motor:  Muscle bulk is normal.   Tone is normal. Strength is  5 / 5 in all 4 extremities.   Sensory: Sensory testing is intact to touch and vibration sensation in all 4 extremities.  Coordination: Cerebellar testing reveals good finger-nose-finger and heel-to-shin bilaterally.  Gait and station: Station is normal.   Gait is normal.   Tandem gait is minimally wide. Romberg is negative.   Reflexes: Deep tendon reflexes are symmetric and normal bilaterally.        DIAGNOSTIC DATA (LABS, IMAGING, TESTING) - I reviewed patient records, labs, notes, testing and imaging myself where available.  Lab Results  Component Value Date   WBC 3.9 02/12/2016   HGB 13.3 02/12/2016   HCT 39.0 02/12/2016   MCV 87.3 02/12/2016   PLT 212 02/12/2016      Component Value Date/Time   NA 139 03/07/2015 1045   NA 139 06/27/2014 1356   K 4.1 03/07/2015 1045   K 3.9 06/27/2014 1356   CL 107 03/07/2015 1045   CL 106 06/27/2014 1356   CO2 25 03/07/2015 1045   CO2 28 06/27/2014 1356   GLUCOSE 95 03/07/2015 1045   GLUCOSE 89 06/27/2014 1356   BUN 17 03/07/2015 1045   BUN 7 06/27/2014 1356   CREATININE 1.11 (H) 03/07/2015 1045   CREATININE 0.99 06/27/2014 1356   CALCIUM 9.0 03/07/2015 1045   CALCIUM 8.3 (L) 06/27/2014 1356   PROT 6.5 03/07/2015 1045   PROT 6.9 06/27/2014 1356   ALBUMIN 4.0 03/07/2015 1045   ALBUMIN 3.8 06/27/2014 1356   AST 20 03/07/2015 1045   AST 15 06/27/2014 1356   ALT 23 03/07/2015 1045   ALT 20 06/27/2014 1356   ALKPHOS 70 03/07/2015 1045   ALKPHOS 105 06/27/2014 1356   BILITOT 0.7 03/07/2015 1045   BILITOT 0.4 06/27/2014 1356    GFRNONAA 60 (L) 03/07/2015 1045   GFRNONAA >60 06/27/2014  1356   GFRNONAA >60 12/26/2013 1031   GFRAA >60 03/07/2015 1045   GFRAA >60 06/27/2014 1356   GFRAA >60 12/26/2013 1031    Lab Results  Component Value Date   VITAMINB12 251 05/28/2015   Lab Results  Component Value Date   TSH 1.887 03/07/2015       ASSESSMENT AND PLAN  Multiple sclerosis (HCC) - Plan: MR Thoracic Spine W Wo Contrast, MR Cervical Spine W Wo Contrast  Gait disturbance - Plan: MR Thoracic Spine W Wo Contrast, MR Cervical Spine W Wo Contrast  Numbness - Plan: MR Thoracic Spine W Wo Contrast, MR Cervical Spine W Wo Contrast  Other fatigue  Urinary frequency    1.   Continue Gilenya.    We will check an MRI of the cervical and thoracic spine due to worsening bladder problems and some gait issues to make sure that there has not been subclinical progression affecting her spinal cord.  Her first exacerbation was in the thoracic spine 2.   Continue Provigil for shift work work sleep disturbance, fatigue and sleepiness 3,   Start baclofen for spasticity. 4.   Start ditropan for urinary frequency. 5.   she will return in 6 months or sooner if there are new or worsening neurologic symptoms.    Richard A. Epimenio Foot, MD, PhD 03/20/2016, 12:44 PM Certified in Neurology, Clinical Neurophysiology, Sleep Medicine, Pain Medicine and Neuroimaging  [facility] [address], [address] [place], [place] [address] [phone]

## 2016-03-23 ENCOUNTER — Other Ambulatory Visit: Payer: Self-pay | Admitting: Neurology

## 2016-03-27 ENCOUNTER — Inpatient Hospital Stay: Payer: 59 | Attending: Hematology and Oncology

## 2016-03-27 ENCOUNTER — Inpatient Hospital Stay: Payer: 59

## 2016-03-27 DIAGNOSIS — E538 Deficiency of other specified B group vitamins: Secondary | ICD-10-CM

## 2016-03-27 DIAGNOSIS — D509 Iron deficiency anemia, unspecified: Secondary | ICD-10-CM

## 2016-03-27 DIAGNOSIS — Z79899 Other long term (current) drug therapy: Secondary | ICD-10-CM | POA: Diagnosis not present

## 2016-03-27 LAB — CBC WITH DIFFERENTIAL/PLATELET
Basophils Absolute: 0 10*3/uL (ref 0–0.1)
Basophils Relative: 1 %
Eosinophils Absolute: 0 10*3/uL (ref 0–0.7)
Eosinophils Relative: 0 %
HCT: 37.2 % (ref 35.0–47.0)
Hemoglobin: 12.6 g/dL (ref 12.0–16.0)
Lymphocytes Relative: 7 %
Lymphs Abs: 0.3 10*3/uL — ABNORMAL LOW (ref 1.0–3.6)
MCH: 29.6 pg (ref 26.0–34.0)
MCHC: 33.9 g/dL (ref 32.0–36.0)
MCV: 87.3 fL (ref 80.0–100.0)
Monocytes Absolute: 0.5 10*3/uL (ref 0.2–0.9)
Monocytes Relative: 12 %
Neutro Abs: 3.2 10*3/uL (ref 1.4–6.5)
Neutrophils Relative %: 80 %
Platelets: 187 10*3/uL (ref 150–440)
RBC: 4.25 MIL/uL (ref 3.80–5.20)
RDW: 13.6 % (ref 11.5–14.5)
WBC: 3.9 10*3/uL (ref 3.6–11.0)

## 2016-03-27 LAB — FERRITIN: Ferritin: 33 ng/mL (ref 11–307)

## 2016-03-27 MED ORDER — CYANOCOBALAMIN 1000 MCG/ML IJ SOLN
1000.0000 ug | Freq: Once | INTRAMUSCULAR | Status: AC
  Administered 2016-03-27: 1000 ug via INTRAMUSCULAR
  Filled 2016-03-27: qty 1

## 2016-04-15 ENCOUNTER — Ambulatory Visit (INDEPENDENT_AMBULATORY_CARE_PROVIDER_SITE_OTHER): Payer: Self-pay

## 2016-04-15 DIAGNOSIS — R269 Unspecified abnormalities of gait and mobility: Secondary | ICD-10-CM

## 2016-04-15 DIAGNOSIS — Z0289 Encounter for other administrative examinations: Secondary | ICD-10-CM

## 2016-04-15 DIAGNOSIS — G35 Multiple sclerosis: Secondary | ICD-10-CM

## 2016-04-15 DIAGNOSIS — R200 Anesthesia of skin: Secondary | ICD-10-CM

## 2016-04-17 ENCOUNTER — Inpatient Hospital Stay: Payer: 59

## 2016-04-22 DIAGNOSIS — G35 Multiple sclerosis: Secondary | ICD-10-CM | POA: Diagnosis not present

## 2016-04-23 ENCOUNTER — Inpatient Hospital Stay: Payer: 59 | Attending: Hematology and Oncology

## 2016-04-23 ENCOUNTER — Other Ambulatory Visit: Payer: Self-pay | Admitting: *Deleted

## 2016-04-23 ENCOUNTER — Other Ambulatory Visit: Payer: Self-pay | Admitting: Neurology

## 2016-04-23 DIAGNOSIS — Z79899 Other long term (current) drug therapy: Secondary | ICD-10-CM | POA: Diagnosis not present

## 2016-04-23 DIAGNOSIS — R269 Unspecified abnormalities of gait and mobility: Secondary | ICD-10-CM

## 2016-04-23 DIAGNOSIS — G35 Multiple sclerosis: Secondary | ICD-10-CM

## 2016-04-23 DIAGNOSIS — E538 Deficiency of other specified B group vitamins: Secondary | ICD-10-CM | POA: Insufficient documentation

## 2016-04-23 DIAGNOSIS — R200 Anesthesia of skin: Secondary | ICD-10-CM

## 2016-04-23 DIAGNOSIS — R350 Frequency of micturition: Secondary | ICD-10-CM

## 2016-04-23 MED ORDER — CYANOCOBALAMIN 1000 MCG/ML IJ SOLN
1000.0000 ug | Freq: Once | INTRAMUSCULAR | Status: AC
  Administered 2016-04-23: 1000 ug via INTRAMUSCULAR
  Filled 2016-04-23: qty 1

## 2016-04-23 NOTE — Progress Notes (Signed)
Orders for MR

## 2016-04-24 ENCOUNTER — Ambulatory Visit (INDEPENDENT_AMBULATORY_CARE_PROVIDER_SITE_OTHER): Payer: Self-pay

## 2016-04-24 ENCOUNTER — Inpatient Hospital Stay: Payer: 59

## 2016-04-24 DIAGNOSIS — R269 Unspecified abnormalities of gait and mobility: Secondary | ICD-10-CM

## 2016-04-24 DIAGNOSIS — R350 Frequency of micturition: Secondary | ICD-10-CM

## 2016-04-24 DIAGNOSIS — R200 Anesthesia of skin: Secondary | ICD-10-CM

## 2016-04-24 DIAGNOSIS — Z0289 Encounter for other administrative examinations: Secondary | ICD-10-CM

## 2016-04-27 ENCOUNTER — Telehealth: Payer: Self-pay | Admitting: *Deleted

## 2016-04-27 NOTE — Telephone Encounter (Signed)
I have spoken with Jody Lee this morning and per RAS, advised MRI c-spine showed several spots, thoracic spine showed one spot; none appear new, and he is not able to tell if they presented before or after she started Gilenya.  Since she's had no def exacerbation recently, he rec. she continue Gilenya.  She verbalized understanding of same, is agreeable with continuing Gilenya/fim

## 2016-04-27 NOTE — Telephone Encounter (Signed)
-----   Message from Asa Lente, MD sent at 04/24/2016  1:29 PM EDT ----- Please at her know that the MRI of the cervical spine does show a few foci in the cervical spinal cord and one focus in the thoracic spinal cord. None of them look to be brand-new. Lesions in the spine can easily explain some of her difficulties with gait and bladder.    I can't tell her for sure whether the spinal foci occurred before or after she started Gilenya.   She has not had definite exacerbations and the MRI of the brain recently showed no changes, I recommend that she stay on the Gilenya.

## 2016-05-10 ENCOUNTER — Encounter: Payer: Self-pay | Admitting: Hematology and Oncology

## 2016-05-20 ENCOUNTER — Inpatient Hospital Stay: Payer: 59 | Attending: Hematology and Oncology

## 2016-05-20 DIAGNOSIS — E538 Deficiency of other specified B group vitamins: Secondary | ICD-10-CM | POA: Diagnosis not present

## 2016-05-20 DIAGNOSIS — E559 Vitamin D deficiency, unspecified: Secondary | ICD-10-CM | POA: Insufficient documentation

## 2016-05-20 DIAGNOSIS — Z79899 Other long term (current) drug therapy: Secondary | ICD-10-CM | POA: Insufficient documentation

## 2016-05-20 DIAGNOSIS — R61 Generalized hyperhidrosis: Secondary | ICD-10-CM | POA: Insufficient documentation

## 2016-05-20 DIAGNOSIS — Z9049 Acquired absence of other specified parts of digestive tract: Secondary | ICD-10-CM | POA: Diagnosis not present

## 2016-05-20 DIAGNOSIS — R232 Flushing: Secondary | ICD-10-CM | POA: Diagnosis not present

## 2016-05-20 DIAGNOSIS — D509 Iron deficiency anemia, unspecified: Secondary | ICD-10-CM | POA: Diagnosis not present

## 2016-05-20 DIAGNOSIS — D508 Other iron deficiency anemias: Secondary | ICD-10-CM

## 2016-05-20 DIAGNOSIS — G35 Multiple sclerosis: Secondary | ICD-10-CM | POA: Diagnosis not present

## 2016-05-20 DIAGNOSIS — Z9884 Bariatric surgery status: Secondary | ICD-10-CM | POA: Diagnosis not present

## 2016-05-20 DIAGNOSIS — I10 Essential (primary) hypertension: Secondary | ICD-10-CM | POA: Diagnosis not present

## 2016-05-20 LAB — CBC WITH DIFFERENTIAL/PLATELET
Basophils Absolute: 0 10*3/uL (ref 0–0.1)
Basophils Relative: 0 %
Eosinophils Absolute: 0 10*3/uL (ref 0–0.7)
Eosinophils Relative: 0 %
HCT: 41.4 % (ref 35.0–47.0)
Hemoglobin: 13.9 g/dL (ref 12.0–16.0)
Lymphocytes Relative: 7 %
Lymphs Abs: 0.2 10*3/uL — ABNORMAL LOW (ref 1.0–3.6)
MCH: 28.7 pg (ref 26.0–34.0)
MCHC: 33.5 g/dL (ref 32.0–36.0)
MCV: 85.7 fL (ref 80.0–100.0)
Monocytes Absolute: 0.3 10*3/uL (ref 0.2–0.9)
Monocytes Relative: 11 %
Neutro Abs: 2.7 10*3/uL (ref 1.4–6.5)
Neutrophils Relative %: 82 %
Platelets: 215 10*3/uL (ref 150–440)
RBC: 4.83 MIL/uL (ref 3.80–5.20)
RDW: 13.4 % (ref 11.5–14.5)
WBC: 3.3 10*3/uL — ABNORMAL LOW (ref 3.6–11.0)

## 2016-05-20 LAB — IRON AND TIBC
Iron: 74 ug/dL (ref 28–170)
Saturation Ratios: 23 % (ref 10.4–31.8)
TIBC: 326 ug/dL (ref 250–450)
UIBC: 252 ug/dL

## 2016-05-20 LAB — FERRITIN: Ferritin: 79 ng/mL (ref 11–307)

## 2016-05-21 ENCOUNTER — Other Ambulatory Visit: Payer: Self-pay | Admitting: *Deleted

## 2016-05-21 DIAGNOSIS — E538 Deficiency of other specified B group vitamins: Secondary | ICD-10-CM

## 2016-05-22 ENCOUNTER — Inpatient Hospital Stay: Payer: 59

## 2016-05-22 ENCOUNTER — Inpatient Hospital Stay (HOSPITAL_BASED_OUTPATIENT_CLINIC_OR_DEPARTMENT_OTHER): Payer: 59 | Admitting: Hematology and Oncology

## 2016-05-22 ENCOUNTER — Other Ambulatory Visit: Payer: Self-pay | Admitting: *Deleted

## 2016-05-22 VITALS — BP 122/78 | HR 60 | Temp 96.8°F | Resp 18 | Wt 254.4 lb

## 2016-05-22 DIAGNOSIS — D509 Iron deficiency anemia, unspecified: Secondary | ICD-10-CM | POA: Diagnosis not present

## 2016-05-22 DIAGNOSIS — E538 Deficiency of other specified B group vitamins: Secondary | ICD-10-CM

## 2016-05-22 DIAGNOSIS — D72810 Lymphocytopenia: Secondary | ICD-10-CM

## 2016-05-22 DIAGNOSIS — Z23 Encounter for immunization: Secondary | ICD-10-CM

## 2016-05-22 DIAGNOSIS — I10 Essential (primary) hypertension: Secondary | ICD-10-CM

## 2016-05-22 DIAGNOSIS — G35 Multiple sclerosis: Secondary | ICD-10-CM

## 2016-05-22 DIAGNOSIS — Z9884 Bariatric surgery status: Secondary | ICD-10-CM

## 2016-05-22 DIAGNOSIS — D508 Other iron deficiency anemias: Secondary | ICD-10-CM

## 2016-05-22 DIAGNOSIS — R232 Flushing: Secondary | ICD-10-CM

## 2016-05-22 DIAGNOSIS — R61 Generalized hyperhidrosis: Secondary | ICD-10-CM

## 2016-05-22 DIAGNOSIS — E559 Vitamin D deficiency, unspecified: Secondary | ICD-10-CM

## 2016-05-22 DIAGNOSIS — Z79899 Other long term (current) drug therapy: Secondary | ICD-10-CM | POA: Diagnosis not present

## 2016-05-22 DIAGNOSIS — Z9049 Acquired absence of other specified parts of digestive tract: Secondary | ICD-10-CM

## 2016-05-22 MED ORDER — CYANOCOBALAMIN 1000 MCG/ML IJ SOLN
1000.0000 ug | Freq: Once | INTRAMUSCULAR | Status: AC
  Administered 2016-05-22: 1000 ug via INTRAMUSCULAR
  Filled 2016-05-22: qty 1

## 2016-05-22 NOTE — Progress Notes (Signed)
[facility]-  Cancer Center  Clinic day:  05/22/16   Chief Complaint: Jody Lee is a 45 y.o. female status post gastric bypass surgery and subsequent iron deficiency and B12 deficiency who is seen for 3 month assessment.  HPI: The patient was last seen in the medical oncology clinic on 02/14/2016.  At that time, her energy level waxed and waned.  Exam was stable.  Ferritin was 56.  CBC on 03/27/2016 revealed a hematocrit of 37.2, hemoglobin 12.6, MCV 87.3, platelets 187,000, WBC 3900 with an ANC of 3200.  Ferritin was 33.  CBC on 05/20/2016 revealed a hematocrit of 41.4, hemoglobin 13.9, MCV 87.3, platelets 215,000, white count 3300 with an ANC of 2700.  Absolute lymphocyte count was 200 (low).  Iron studies included a ferritin of 79.  Iron saturation was 23% with a TIBC of 326.  Symptomatically,  she feels "okay".  She notes some hot flashes and some night sweats. She describes feeling cold all the time.  She receives DepoProvera injections.   Past Medical History:  Diagnosis Date  . Anemia   . Hypertension   . MS (multiple sclerosis) (HCC)   . Vision abnormalities   . Vitamin B 12 deficiency   . Vitamin D deficiency     Past Surgical History:  Procedure Laterality Date  . CHOLECYSTECTOMY    . GASTRIC BYPASS      Family History  Problem Relation Age of Onset  . Stroke Mother   . Heart disease Mother   . Asthma Mother   . Hypertension Father   . Heart disease Father   . Diabetes Father   . Asthma Brother   . Alcoholism Brother   . Obesity Brother   . Heart disease Brother   . Breast cancer Neg Hx     Social History:  reports that she has never smoked. She does not have any smokeless tobacco history on file. She reports that she does not drink alcohol. Her drug history is not on file.  She recently moved from an apartment into a house.  The patient is alone today.  Allergies:  Allergies  Allergen Reactions  . Nsaids Other (See Comments)   Not supposed to take d/t bariatric surgery   . Ciprofloxacin Rash    Current Medications: Current Outpatient Prescriptions  Medication Sig Dispense Refill  . acetaminophen (TYLENOL) 325 MG tablet Take 650 mg by mouth every 4 (four) hours as needed.    . baclofen (LIORESAL) 10 MG tablet Take 1 tablet (10 mg total) by mouth 3 (three) times daily. 30 each 0  . calcium-vitamin D (OSCAL WITH D) 500-200 MG-UNIT per tablet Take 1 tablet by mouth daily with breakfast. Reported on 09/19/2015    . folic acid (FOLVITE) 1 MG tablet Take 2 mg by mouth daily.    Marland Kitchen GILENYA 0.5 MG CAPS Take 1 capsule (0.5 mg total) by mouth daily. 90 capsule 3  . hydrochlorothiazide (MICROZIDE) 12.5 MG capsule Take 12.5 mg by mouth daily.     Marland Kitchen lisinopril (PRINIVIL,ZESTRIL) 40 MG tablet TAKE 1 TABLET DAILY 90 tablet 2  . MedroxyPROGESTERone Acetate 150 MG/ML SUSY Inject 1 mL into the skin every 3 (three) months.     . modafinil (PROVIGIL) 200 MG tablet Take 1 tablet (200 mg total) by mouth daily. 30 tablet 5  . Multiple Vitamin (MULTI-VITAMINS) TABS Take 1 tablet by mouth daily.      No current facility-administered medications for this visit.    Facility-Administered  Medications Ordered in Other Visits  Medication Dose Route Frequency Provider Last Rate Last Dose  . gadopentetate dimeglumine (MAGNEVIST) injection 20 mL  20 mL Intravenous Once PRN Asa Lenteichard A Sater, MD        Review of Systems:  GENERAL:  Feels "ok". Feels "cold all the time".  No fevers.  Some night sweats.  Weight gain of 1 pound. PERFORMANCE STATUS (ECOG):  1 HEENT:  No visual changes, runny nose, sore throat, mouth sores or tenderness. Lungs: No shortness of breath or cough.  No hemoptysis. Cardiac:  No chest pain, palpitations, orthopnea, or PND. GI:  No nausea, vomiting, diarrhea, constipation, melena or hematochezia. GU:  No urgency, frequency, dysuria, or hematuria. Musculoskeletal:  No pain or muscle tenderness. Extremities:  No pain or  swelling. Skin:  No rashes or skin changes. Neuro:  Peripheral neuropathy.  No headache, numbness or weakness, balance or coordination issues. Endocrine:  No diabetes, thyroid issues.  Hot flashes and  night sweats. Psych:  No mood changes, depression or anxiety. Pain:  No focal pain. Review of systems:  All other systems reviewed and found to be negative.   Physical Exam: Blood pressure (!) 136/99, pulse 60, temperature (!) 96.8 F (36 C), temperature source Tympanic, resp. rate 18, weight 254 lb 6.6 oz (115.4 kg). GENERAL:  Well developed, well nourished, heavyset woman sitting comfortably in the exam room in no acute distress. MENTAL STATUS:  Alert and oriented to person, place and time. HEAD:  Brown hair pulled back.  Normocephalic, atraumatic, face symmetric, no Cushingoid features. EYES:  Glasses.  Blue eyes.  Pupils equal round and reactive to light and accomodation.  No conjunctivitis or scleral icterus. ENT:  Oropharynx clear without lesion.  Dentures.  Tongue normal. Mucous membranes moist.  RESPIRATORY:  Clear to auscultation without rales, wheezes or rhonchi. CARDIOVASCULAR:  Regular rate and rhythm without murmur, rub or gallop. ABDOMEN:  Fully round.  Soft, non-tender, with active bowel sounds, and no appreciable hepatosplenomegaly.  No masses. SKIN:  Abdominal striae.  No rashes, ulcers or lesions. EXTREMITIES: No edema, no skin discoloration or tenderness.  No palpable cords. LYMPH NODES: No palpable cervical, supraclavicular, axillary or inguinal adenopathy  NEUROLOGICAL: Unremarkable. PSYCH:  Appropriate.   Appointment on 05/20/2016  Component Date Value Ref Range Status  . WBC 05/20/2016 3.3* 3.6 - 11.0 K/uL Final  . RBC 05/20/2016 4.83  3.80 - 5.20 MIL/uL Final  . Hemoglobin 05/20/2016 13.9  12.0 - 16.0 g/dL Final  . HCT 78/29/562110/06/2016 41.4  35.0 - 47.0 % Final  . MCV 05/20/2016 85.7  80.0 - 100.0 fL Final  . MCH 05/20/2016 28.7  26.0 - 34.0 pg Final  . MCHC  05/20/2016 33.5  32.0 - 36.0 g/dL Final  . RDW 30/86/578410/06/2016 13.4  11.5 - 14.5 % Final  . Platelets 05/20/2016 215  150 - 440 K/uL Final  . Neutrophils Relative % 05/20/2016 82  % Final  . Neutro Abs 05/20/2016 2.7  1.4 - 6.5 K/uL Final  . Lymphocytes Relative 05/20/2016 7  % Final  . Lymphs Abs 05/20/2016 0.2* 1.0 - 3.6 K/uL Final  . Monocytes Relative 05/20/2016 11  % Final  . Monocytes Absolute 05/20/2016 0.3  0.2 - 0.9 K/uL Final  . Eosinophils Relative 05/20/2016 0  % Final  . Eosinophils Absolute 05/20/2016 0.0  0 - 0.7 K/uL Final  . Basophils Relative 05/20/2016 0  % Final  . Basophils Absolute 05/20/2016 0.0  0 - 0.1 K/uL Final  . Ferritin  05/20/2016 79  11 - 307 ng/mL Final  . Iron 05/20/2016 74  28 - 170 ug/dL Final  . TIBC 46/50/3546 326  250 - 450 ug/dL Final  . Saturation Ratios 05/20/2016 23  10.4 - 31.8 % Final  . UIBC 05/20/2016 252  ug/dL Final    Assessment:  EUDELIA CAHOON is a 45 y.o. female  status post gastric bypass surgery (2003) with resultant iron deficiency anemia and B12 deficiency.  She is intolerant of oral iron.  She receives Venofer when her iron stores are low (last 11/29/2015).  She becomes symptomatic when her ferritin is < 30.   She was initially treated with B12 IM, but has subsequently been on oral B12.  B12 was sub-therapeutic on 06/27/2014.  She received weekly B12 x 6 (01/04/2015-02/22/2015) followed by monthly B12 (last 01/10/2016).  Folic acid was normal (12.5) on 01/25/2015.  She takes daily folic acid.    She has multiple sclerosis on Gilenya (fingolimod). There is a 4-7% risk of lymphopenia.  There is a < 1%, post marketing/case reports of lymphoma.  Symptomatically, she feels ok.  She has a 2 week history of hot flashes and night sweats.  Exam reveals no palpable adenopathy or hepatosplenomegaly.  Ferritin is 79.  Plan: 1.  Review labs from 02/12/2016.  Iron stores adequate.  Continue to monitor.  Discuss Venofer when ferritin < 30. 2.  B12  today and monthly. 3.  Discuss lymphopenia.  Patient on Gilenya.  Follow-up with neurology. 4.  Encourage follow-up with gynecology regarding new hot flashes and night sweats.  Doubt B symptoms. 5.  RTC in 6 weeks for labs (CBC with diff, ferritin, LDH, uric acid). 4.  RTC in 3 months for MD assessment and labs (CBC with diff, ferritin, , iron studies- day before), and +/- Venofer.   Rosey Bath, MD  05/22/2016, 10:38 AM

## 2016-05-22 NOTE — Progress Notes (Signed)
Patient states she has noticed over the past 10 days she is having hot flashes and night sweats.  Asking for flu injection today.  BP elevated today.  She has taken her BP meds but is also taking Dayquil for sinus problems. BP 146/105 HR 67.  Retake 136/99 HR 60.

## 2016-05-23 ENCOUNTER — Encounter: Payer: Self-pay | Admitting: Hematology and Oncology

## 2016-06-08 ENCOUNTER — Encounter: Payer: Self-pay | Admitting: Neurology

## 2016-06-08 ENCOUNTER — Encounter: Payer: Self-pay | Admitting: Hematology and Oncology

## 2016-06-26 ENCOUNTER — Other Ambulatory Visit: Payer: Self-pay | Admitting: Hematology and Oncology

## 2016-06-26 ENCOUNTER — Inpatient Hospital Stay: Payer: 59

## 2016-06-26 ENCOUNTER — Inpatient Hospital Stay: Payer: 59 | Attending: Hematology and Oncology

## 2016-06-26 DIAGNOSIS — D508 Other iron deficiency anemias: Secondary | ICD-10-CM

## 2016-06-26 DIAGNOSIS — Z79899 Other long term (current) drug therapy: Secondary | ICD-10-CM | POA: Insufficient documentation

## 2016-06-26 DIAGNOSIS — E538 Deficiency of other specified B group vitamins: Secondary | ICD-10-CM | POA: Diagnosis not present

## 2016-06-26 LAB — CBC WITH DIFFERENTIAL/PLATELET
Basophils Absolute: 0 10*3/uL (ref 0–0.1)
Basophils Relative: 0 %
Eosinophils Absolute: 0 10*3/uL (ref 0–0.7)
Eosinophils Relative: 0 %
HCT: 37.4 % (ref 35.0–47.0)
Hemoglobin: 12.6 g/dL (ref 12.0–16.0)
Lymphocytes Relative: 6 %
Lymphs Abs: 0.2 10*3/uL — ABNORMAL LOW (ref 1.0–3.6)
MCH: 29.5 pg (ref 26.0–34.0)
MCHC: 33.7 g/dL (ref 32.0–36.0)
MCV: 87.4 fL (ref 80.0–100.0)
Monocytes Absolute: 0.4 10*3/uL (ref 0.2–0.9)
Monocytes Relative: 15 %
Neutro Abs: 2.4 10*3/uL (ref 1.4–6.5)
Neutrophils Relative %: 79 %
Platelets: 230 10*3/uL (ref 150–440)
RBC: 4.28 MIL/uL (ref 3.80–5.20)
RDW: 14.5 % (ref 11.5–14.5)
WBC: 3 10*3/uL — ABNORMAL LOW (ref 3.6–11.0)

## 2016-06-26 LAB — URIC ACID: Uric Acid, Serum: 4.9 mg/dL (ref 2.3–6.6)

## 2016-06-26 LAB — FERRITIN: Ferritin: 45 ng/mL (ref 11–307)

## 2016-06-26 MED ORDER — CYANOCOBALAMIN 1000 MCG/ML IJ SOLN
1000.0000 ug | Freq: Once | INTRAMUSCULAR | Status: AC
  Administered 2016-06-26: 1000 ug via INTRAMUSCULAR
  Filled 2016-06-26: qty 1

## 2016-06-30 ENCOUNTER — Other Ambulatory Visit: Payer: Self-pay | Admitting: *Deleted

## 2016-06-30 DIAGNOSIS — D508 Other iron deficiency anemias: Secondary | ICD-10-CM

## 2016-06-30 DIAGNOSIS — E538 Deficiency of other specified B group vitamins: Secondary | ICD-10-CM

## 2016-06-30 LAB — LEUKOCYTE ALKALINE PHOSPHATASE: Leukocyte Alkaline  Phos Stain: 72 (ref 25–130)

## 2016-07-06 ENCOUNTER — Encounter: Payer: Self-pay | Admitting: Hematology and Oncology

## 2016-07-24 ENCOUNTER — Inpatient Hospital Stay: Payer: 59 | Attending: Hematology and Oncology

## 2016-08-20 ENCOUNTER — Telehealth: Payer: Self-pay | Admitting: *Deleted

## 2016-08-20 ENCOUNTER — Other Ambulatory Visit: Payer: Self-pay | Admitting: *Deleted

## 2016-08-20 ENCOUNTER — Inpatient Hospital Stay: Payer: 59 | Attending: Hematology and Oncology

## 2016-08-20 DIAGNOSIS — Z23 Encounter for immunization: Secondary | ICD-10-CM | POA: Insufficient documentation

## 2016-08-20 DIAGNOSIS — G35 Multiple sclerosis: Secondary | ICD-10-CM | POA: Diagnosis not present

## 2016-08-20 DIAGNOSIS — E538 Deficiency of other specified B group vitamins: Secondary | ICD-10-CM | POA: Diagnosis not present

## 2016-08-20 DIAGNOSIS — E559 Vitamin D deficiency, unspecified: Secondary | ICD-10-CM | POA: Insufficient documentation

## 2016-08-20 DIAGNOSIS — R5383 Other fatigue: Secondary | ICD-10-CM | POA: Insufficient documentation

## 2016-08-20 DIAGNOSIS — Z9884 Bariatric surgery status: Secondary | ICD-10-CM | POA: Diagnosis not present

## 2016-08-20 DIAGNOSIS — Z79899 Other long term (current) drug therapy: Secondary | ICD-10-CM | POA: Diagnosis not present

## 2016-08-20 DIAGNOSIS — D509 Iron deficiency anemia, unspecified: Secondary | ICD-10-CM | POA: Diagnosis not present

## 2016-08-20 DIAGNOSIS — I10 Essential (primary) hypertension: Secondary | ICD-10-CM | POA: Insufficient documentation

## 2016-08-20 DIAGNOSIS — D508 Other iron deficiency anemias: Secondary | ICD-10-CM

## 2016-08-20 LAB — CBC WITH DIFFERENTIAL/PLATELET
Basophils Absolute: 0 10*3/uL (ref 0–0.1)
Basophils Relative: 1 %
Eosinophils Absolute: 0 10*3/uL (ref 0–0.7)
Eosinophils Relative: 0 %
HCT: 38.4 % (ref 35.0–47.0)
Hemoglobin: 13 g/dL (ref 12.0–16.0)
Lymphocytes Relative: 5 %
Lymphs Abs: 0.2 10*3/uL — ABNORMAL LOW (ref 1.0–3.6)
MCH: 29.7 pg (ref 26.0–34.0)
MCHC: 34 g/dL (ref 32.0–36.0)
MCV: 87.3 fL (ref 80.0–100.0)
Monocytes Absolute: 0.5 10*3/uL (ref 0.2–0.9)
Monocytes Relative: 13 %
Neutro Abs: 3 10*3/uL (ref 1.4–6.5)
Neutrophils Relative %: 81 %
Platelets: 201 10*3/uL (ref 150–440)
RBC: 4.4 MIL/uL (ref 3.80–5.20)
RDW: 13.4 % (ref 11.5–14.5)
WBC: 3.7 10*3/uL (ref 3.6–11.0)

## 2016-08-20 LAB — IRON AND TIBC
Iron: 51 ug/dL (ref 28–170)
Saturation Ratios: 16 % (ref 10.4–31.8)
TIBC: 323 ug/dL (ref 250–450)
UIBC: 272 ug/dL

## 2016-08-20 LAB — LACTATE DEHYDROGENASE: LDH: 142 U/L (ref 98–192)

## 2016-08-20 LAB — FERRITIN: Ferritin: 28 ng/mL (ref 11–307)

## 2016-08-20 NOTE — Telephone Encounter (Signed)
Called patient to inform her that she is not anemic.  Patient will RTC tomorrow for B-12/possible venofer and would like flu shot.

## 2016-08-20 NOTE — Telephone Encounter (Signed)
-----   Message from Rosey Bath, MD sent at 08/20/2016 12:16 PM EST ----- Regarding: Please call patient  Not anemic.  MCV normal.  Ferritin < 30.  Notes suggest she become symptomatic when ferritin < 30.  If symptomatic, Venofer x 1.  M  ----- Message ----- From: Interface, Lab In [place] Sent: 08/20/2016   8:40 AM To: Rosey Bath, MD

## 2016-08-21 ENCOUNTER — Inpatient Hospital Stay: Payer: 59

## 2016-08-21 ENCOUNTER — Encounter: Payer: Self-pay | Admitting: Hematology and Oncology

## 2016-08-21 ENCOUNTER — Inpatient Hospital Stay (HOSPITAL_BASED_OUTPATIENT_CLINIC_OR_DEPARTMENT_OTHER): Payer: 59 | Admitting: Hematology and Oncology

## 2016-08-21 ENCOUNTER — Other Ambulatory Visit: Payer: Self-pay | Admitting: Hematology and Oncology

## 2016-08-21 VITALS — BP 166/83 | HR 54 | Temp 96.3°F | Resp 18 | Wt 270.1 lb

## 2016-08-21 DIAGNOSIS — D509 Iron deficiency anemia, unspecified: Secondary | ICD-10-CM | POA: Diagnosis not present

## 2016-08-21 DIAGNOSIS — Z79899 Other long term (current) drug therapy: Secondary | ICD-10-CM

## 2016-08-21 DIAGNOSIS — Z9884 Bariatric surgery status: Secondary | ICD-10-CM

## 2016-08-21 DIAGNOSIS — E559 Vitamin D deficiency, unspecified: Secondary | ICD-10-CM

## 2016-08-21 DIAGNOSIS — E538 Deficiency of other specified B group vitamins: Secondary | ICD-10-CM | POA: Diagnosis not present

## 2016-08-21 DIAGNOSIS — R5383 Other fatigue: Secondary | ICD-10-CM | POA: Diagnosis not present

## 2016-08-21 DIAGNOSIS — I10 Essential (primary) hypertension: Secondary | ICD-10-CM | POA: Diagnosis not present

## 2016-08-21 DIAGNOSIS — G35 Multiple sclerosis: Secondary | ICD-10-CM | POA: Diagnosis not present

## 2016-08-21 DIAGNOSIS — D508 Other iron deficiency anemias: Secondary | ICD-10-CM

## 2016-08-21 DIAGNOSIS — Z23 Encounter for immunization: Secondary | ICD-10-CM

## 2016-08-21 MED ORDER — INFLUENZA VAC SPLIT QUAD 0.5 ML IM SUSY
0.5000 mL | PREFILLED_SYRINGE | Freq: Once | INTRAMUSCULAR | Status: AC
  Administered 2016-08-21: 0.5 mL via INTRAMUSCULAR
  Filled 2016-08-21: qty 0.5

## 2016-08-21 MED ORDER — CYANOCOBALAMIN 1000 MCG/ML IJ SOLN
1000.0000 ug | Freq: Once | INTRAMUSCULAR | Status: AC
  Administered 2016-08-21: 1000 ug via INTRAMUSCULAR
  Filled 2016-08-21: qty 1

## 2016-08-21 MED ORDER — INFLUENZA VAC SPLIT QUAD 0.5 ML IM SUSY: 0.5000 mL | PREFILLED_SYRINGE | INTRAMUSCULAR | Status: DC

## 2016-08-21 MED ORDER — IRON SUCROSE 20 MG/ML IV SOLN
200.0000 mg | Freq: Once | INTRAVENOUS | Status: AC
  Administered 2016-08-21: 200 mg via INTRAVENOUS
  Filled 2016-08-21: qty 10

## 2016-08-21 MED ORDER — IRON SUCROSE 20 MG/ML IV SOLN: 200.0000 mg | Freq: Once | INTRAVENOUS | Status: DC

## 2016-08-21 MED ORDER — SODIUM CHLORIDE 0.9 % IV SOLN
Freq: Once | INTRAVENOUS | Status: AC
  Administered 2016-08-21: 12:00:00 via INTRAVENOUS
  Filled 2016-08-21: qty 1000

## 2016-08-21 NOTE — Progress Notes (Signed)
Patient states she feels tired.  She would like to try the venofer today.  Patient wants to get flu shot also.   Patient's BP elevated today.  174/109  HR 61  Recheck 162/101  HR 59.  Patient states she has been taking OTC sinus/cold medication.

## 2016-08-21 NOTE — Progress Notes (Signed)
[facility]-  Cancer Center  Clinic day:  08/21/16   Chief Complaint: Jody Lee is a 46 y.o. female status post gastric bypass surgery and subsequent iron deficiency and B12 deficiency who is seen for 3 month assessment.  HPI: The patient was last seen in the medical oncology clinic on 05/22/2016.  At that time, she felt "ok".  She had a 2 week history of hot flashes and night sweats.  Exam revealed no palpable adenopathy or hepatosplenomegaly.  Ferritin is 79.  She received B12.  She returned for labs yesterday.  Hematocrit was 38.4, hemoglobin 13, and MCV 87.3.  Ferritin was 28.  Iron studies were normal with an iron saturation of 16% and a TIBC of 323.  Symptomatically, she has been busy.  She is off work today.  She missed her last B12 injection.  She denies any night sweats.  She would like a flu shot today.   Past Medical History:  Diagnosis Date  . Anemia   . Hypertension   . MS (multiple sclerosis) (HCC)   . Vision abnormalities   . Vitamin B 12 deficiency   . Vitamin D deficiency     Past Surgical History:  Procedure Laterality Date  . CHOLECYSTECTOMY    . GASTRIC BYPASS      Family History  Problem Relation Age of Onset  . Stroke Mother   . Heart disease Mother   . Asthma Mother   . Hypertension Father   . Heart disease Father   . Diabetes Father   . Asthma Brother   . Alcoholism Brother   . Obesity Brother   . Heart disease Brother   . Breast cancer Neg Hx     Social History:  reports that she has never smoked. She does not have any smokeless tobacco history on file. She reports that she does not drink alcohol. Her drug history is not on file.  She recently moved from an apartment into a house.  The patient is alone today.  Allergies:  Allergies  Allergen Reactions  . Nsaids Other (See Comments)    Not supposed to take d/t bariatric surgery   . Ciprofloxacin Rash    Current Medications: Current Outpatient Prescriptions   Medication Sig Dispense Refill  . acetaminophen (TYLENOL) 325 MG tablet Take 650 mg by mouth every 4 (four) hours as needed.    . baclofen (LIORESAL) 10 MG tablet Take 1 tablet (10 mg total) by mouth 3 (three) times daily. 30 each 0  . calcium-vitamin D (OSCAL WITH D) 500-200 MG-UNIT per tablet Take 1 tablet by mouth daily with breakfast. Reported on 09/19/2015    . folic acid (FOLVITE) 1 MG tablet Take 2 mg by mouth daily.    Marland Kitchen GILENYA 0.5 MG CAPS Take 1 capsule (0.5 mg total) by mouth daily. 90 capsule 3  . hydrochlorothiazide (MICROZIDE) 12.5 MG capsule Take 12.5 mg by mouth daily.     Marland Kitchen lisinopril (PRINIVIL,ZESTRIL) 40 MG tablet TAKE 1 TABLET DAILY 90 tablet 2  . MedroxyPROGESTERone Acetate 150 MG/ML SUSY Inject 1 mL into the skin every 3 (three) months.     . modafinil (PROVIGIL) 200 MG tablet Take 1 tablet (200 mg total) by mouth daily. 30 tablet 5  . Multiple Vitamin (MULTI-VITAMINS) TABS Take 1 tablet by mouth daily.      No current facility-administered medications for this visit.    Facility-Administered Medications Ordered in Other Visits  Medication Dose Route Frequency Provider Last Rate Last  Dose  . gadopentetate dimeglumine (MAGNEVIST) injection 20 mL  20 mL Intravenous Once PRN Asa Lente, MD      . Influenza vac split quadrivalent PF (FLUARIX) injection 0.5 mL  0.5 mL Intramuscular Once Rosey Bath, MD        Review of Systems:  GENERAL:  Feels "ok". Feels "cold all the time".  No fevers.  Some night sweats.  Weight gain of 1 pound. PERFORMANCE STATUS (ECOG):  1 HEENT:  No visual changes, runny nose, sore throat, mouth sores or tenderness. Lungs: No shortness of breath or cough.  No hemoptysis. Cardiac:  No chest pain, palpitations, orthopnea, or PND. GI:  No nausea, vomiting, diarrhea, constipation, melena or hematochezia. GU:  No urgency, frequency, dysuria, or hematuria. Musculoskeletal:  No pain or muscle tenderness. Extremities:  No pain or  swelling. Skin:  No rashes or skin changes. Neuro:  Peripheral neuropathy.  No headache, numbness or weakness, balance or coordination issues. Endocrine:  No diabetes, thyroid issues.  Hot flashes and  night sweats. Psych:  No mood changes, depression or anxiety. Pain:  No focal pain. Review of systems:  All other systems reviewed and found to be negative.   Physical Exam: Blood pressure (!) 162/102, pulse (!) 59, temperature (!) 96.3 F (35.7 C), temperature source Tympanic, resp. rate 18, weight 270 lb 1 oz (122.5 kg). GENERAL:  Well developed, well nourished, heavyset woman sitting comfortably in the exam room in no acute distress. MENTAL STATUS:  Alert and oriented to person, place and time. HEAD:  Brown hair pulled back.  Normocephalic, atraumatic, face symmetric, no Cushingoid features. EYES:  Glasses.  Blue eyes.  Pupils equal round and reactive to light and accomodation.  No conjunctivitis or scleral icterus. ENT:  Oropharynx clear without lesion.  Dentures.  Tongue normal. Mucous membranes moist.  RESPIRATORY:  Clear to auscultation without rales, wheezes or rhonchi. CARDIOVASCULAR:  Regular rate and rhythm without murmur, rub or gallop. ABDOMEN:  Fully round.  Soft, non-tender, with active bowel sounds, and no appreciable hepatosplenomegaly.  No masses. SKIN:  Abdominal striae.  No rashes, ulcers or lesions. EXTREMITIES: No edema, no skin discoloration or tenderness.  No palpable cords. LYMPH NODES: No palpable cervical, supraclavicular, axillary or inguinal adenopathy  NEUROLOGICAL: Unremarkable. PSYCH:  Appropriate.   Appointment on 08/20/2016  Component Date Value Ref Range Status  . WBC 08/20/2016 3.7  3.6 - 11.0 K/uL Final  . RBC 08/20/2016 4.40  3.80 - 5.20 MIL/uL Final  . Hemoglobin 08/20/2016 13.0  12.0 - 16.0 g/dL Final  . HCT 16/05/9603 38.4  35.0 - 47.0 % Final  . MCV 08/20/2016 87.3  80.0 - 100.0 fL Final  . MCH 08/20/2016 29.7  26.0 - 34.0 pg Final  . MCHC  08/20/2016 34.0  32.0 - 36.0 g/dL Final  . RDW 54/04/8118 13.4  11.5 - 14.5 % Final  . Platelets 08/20/2016 201  150 - 440 K/uL Final  . Neutrophils Relative % 08/20/2016 81  % Final  . Neutro Abs 08/20/2016 3.0  1.4 - 6.5 K/uL Final  . Lymphocytes Relative 08/20/2016 5  % Final  . Lymphs Abs 08/20/2016 0.2* 1.0 - 3.6 K/uL Final  . Monocytes Relative 08/20/2016 13  % Final  . Monocytes Absolute 08/20/2016 0.5  0.2 - 0.9 K/uL Final  . Eosinophils Relative 08/20/2016 0  % Final  . Eosinophils Absolute 08/20/2016 0.0  0 - 0.7 K/uL Final  . Basophils Relative 08/20/2016 1  % Final  . Basophils  Absolute 08/20/2016 0.0  0 - 0.1 K/uL Final  . Ferritin 08/20/2016 28  11 - 307 ng/mL Final  . Iron 08/20/2016 51  28 - 170 ug/dL Final  . TIBC 96/11/5407 323  250 - 450 ug/dL Final  . Saturation Ratios 08/20/2016 16  10.4 - 31.8 % Final  . UIBC 08/20/2016 272  ug/dL Final  . LDH 81/19/1478 142  98 - 192 U/L Final    Assessment:  Jody Lee is a 46 y.o. female  status post gastric bypass surgery (2003) with resultant iron deficiency anemia and B12 deficiency.  She is intolerant of oral iron.   She has iron deficiency.  She receives Venofer when her ferritin is < 30 (last 11/29/2015).  Ferritin has been followed:  62 on 01/04/2015, 73 on 02/22/2015, 40 on 03/25/2015, 10 on 05/28/2015, 61 on 08/21/2015, 20 on 11/12/2015, 56 on 02/12/2016, 33 on 03/27/2016, 79 on 05/20/2016, 45 on 06/26/2016, and 28 on 08/20/2016.  She has B12 deficiency.  She was initially treated with B12 IM, but has subsequently been on oral B12.  B12 was sub-therapeutic on 06/27/2014.  She received weekly B12 x 6 (01/04/2015-02/22/2015) followed by monthly B12 (last 06/26/2016).  Folic acid was normal (12.5) on 01/25/2015.  She takes daily folic acid.    She has multiple sclerosis on Gilenya (fingolimod). There is a 4-7% risk of lymphopenia.  There is a < 1%, post marketing/case reports of lymphoma.  Symptomatically, she is  fatigued.  Exam reveals no palpable adenopathy or hepatosplenomegaly.  Hematocrit is 38.4.  Ferritin is 28.  Plan: 1.  Review labs from 08/21/2015.  Iron stores drifting down (ferritin 79 on 05/20/2016).  Discuss prior plan of Venofer when ferritin < 30 secondary to patient's symptoms.  Patient requests Venofer today. 2.  B12 today and monthly x 2. 3.  Venofer 200 mg IV 4.  Influenza vaccine today. 5.  RTC in 3 months for MD assessment and labs (CBC with diff, ferritin, , iron studies- day before), and +/- Venofer.   Rosey Bath, MD  08/21/2016, 11:15 AM

## 2016-09-18 ENCOUNTER — Inpatient Hospital Stay: Payer: 59

## 2016-09-21 ENCOUNTER — Inpatient Hospital Stay: Payer: 59 | Attending: Hematology and Oncology

## 2016-09-21 ENCOUNTER — Ambulatory Visit: Payer: 59 | Admitting: Neurology

## 2016-09-21 DIAGNOSIS — Z79899 Other long term (current) drug therapy: Secondary | ICD-10-CM | POA: Diagnosis not present

## 2016-09-21 DIAGNOSIS — E538 Deficiency of other specified B group vitamins: Secondary | ICD-10-CM

## 2016-09-21 MED ORDER — CYANOCOBALAMIN 1000 MCG/ML IJ SOLN
1000.0000 ug | Freq: Once | INTRAMUSCULAR | Status: AC
  Administered 2016-09-21: 1000 ug via INTRAMUSCULAR
  Filled 2016-09-21: qty 1

## 2016-09-29 ENCOUNTER — Telehealth: Payer: Self-pay | Admitting: *Deleted

## 2016-09-29 NOTE — Telephone Encounter (Signed)
Modafinil PA completed via Cover My Meds.  PA approved for dx. 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  She has tried and failed stimulants/fim

## 2016-10-11 ENCOUNTER — Encounter: Payer: Self-pay | Admitting: Hematology and Oncology

## 2016-10-15 ENCOUNTER — Inpatient Hospital Stay: Payer: 59 | Attending: Hematology and Oncology

## 2016-10-15 DIAGNOSIS — Z79899 Other long term (current) drug therapy: Secondary | ICD-10-CM | POA: Insufficient documentation

## 2016-10-15 DIAGNOSIS — E538 Deficiency of other specified B group vitamins: Secondary | ICD-10-CM | POA: Diagnosis not present

## 2016-10-15 MED ORDER — CYANOCOBALAMIN 1000 MCG/ML IJ SOLN
1000.0000 ug | Freq: Once | INTRAMUSCULAR | Status: AC
  Administered 2016-10-15: 1000 ug via INTRAMUSCULAR
  Filled 2016-10-15: qty 1

## 2016-10-16 ENCOUNTER — Inpatient Hospital Stay: Payer: 59

## 2016-10-21 ENCOUNTER — Encounter: Payer: Self-pay | Admitting: Neurology

## 2016-10-21 ENCOUNTER — Ambulatory Visit (INDEPENDENT_AMBULATORY_CARE_PROVIDER_SITE_OTHER): Payer: 59 | Admitting: Neurology

## 2016-10-21 VITALS — BP 114/72 | Resp 18 | Ht 64.0 in | Wt 280.5 lb

## 2016-10-21 DIAGNOSIS — R200 Anesthesia of skin: Secondary | ICD-10-CM | POA: Diagnosis not present

## 2016-10-21 DIAGNOSIS — R350 Frequency of micturition: Secondary | ICD-10-CM | POA: Diagnosis not present

## 2016-10-21 DIAGNOSIS — R269 Unspecified abnormalities of gait and mobility: Secondary | ICD-10-CM

## 2016-10-21 DIAGNOSIS — G35 Multiple sclerosis: Secondary | ICD-10-CM

## 2016-10-21 DIAGNOSIS — R5383 Other fatigue: Secondary | ICD-10-CM

## 2016-10-21 MED ORDER — MODAFINIL 200 MG PO TABS: 200.0000 mg | ORAL_TABLET | Freq: Every day | ORAL | 5 refills | Status: DC

## 2016-10-21 MED ORDER — TANDEM PLUS 162-115.2-1 MG PO CAPS: 1.0000 | ORAL_CAPSULE | Freq: Every day | ORAL | 4 refills | Status: DC

## 2016-10-21 NOTE — Progress Notes (Signed)
[facility]  PATIENT: Jody Lee DOB: 1971-08-05  REFERRING DOCTOR OR PCP:  Venora Maples SOURCE: patient and labs, notes in EMR and MRI on PACS  _________________________________   HISTORICAL  CHIEF COMPLAINT:  Chief Complaint  Patient presents with  . Multiple Sclerosis    Sts. she continues to tolerate Gilenya well.  Sts. cramps in both feet have been more frequent, more generalized muscle and joint aches. Would like r/f of Provigil/fim    HISTORY OF PRESENT ILLNESS:  Jody Lee is a 46 year old woman with MS diagnosed in 2005.     MS:   She is on Gilenya and tolerates it well.     She has not had any definite exacerbation.   She has noted more rightleg spasms.   Spine MRI's showed several chronic plaques in her cervical spine and one in the thoracic spine.,    Gait/strength/sensation:  She sometimes stumbles due to her right foot being slightly weak.    Sometimes she veers while walking.  No falls.      She denies any major problem with weakness or numbness. She has more trouble using a can opener and occasionally is dropping items.     She gets right foot cramps, helped by baclofen..     Bladder: She notes worsening urinary frequency and urgency. She does not have incontinence.   She has fewer spasms.    Vision: She denies any MS related visual problems.   She has new glasses  Fatigue/sleep: She reports physical and cognitive fatigue. She has noted a benefit with Provigil. Adderall was not well tolerated.     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   She also takes a lot of caffeine on days that she needs to stay up longer or if she is out of her Provigil. She has some difficulty falling asleep but once asleep often stays asleep.    Often she has difficulty turning her mind off.   Her fianc has noted that she moans that her sleep sometimes. She has not had any  screaming, kicking or punching.  Mood/cognition: She denies any major difficulty with depression or anxiety at this time. She notes some cognitive difficulty. This is mostly problems with focusing and distraction.  She feels cognition does better with Provigil as well .Marland Kitchen She strays mentally active.  Other: Her anemia is doing better and she gets intermittent iron infusions and B12 monthly.   MS History:   She was diagnosed with MS in 2005 after presenting with a spinal cord syndrome. She started with numbness and pain in the feet that worked its way up to her abdomen over a couple days.   Initially, she had an MRI of the spine that showed a plaque consistent with her symptoms. She was referred to Dr. Maple Hudson. He did a lumbar puncture and ordered an MRI of the brain. Both were consistent with multiple sclerosis and I started to see her. Initially, she was placed on Betaseron and did very well for the first few years. Then around 2011 or 2012, she had an exacerbation and testing showed that she had developed antibodies against Betaseron. Therefore, she was switched to Gilenya at that time. She has been on Gilenya for the last 4+ years and has done well. Specifically, she has not reported any difficulties with exacerbations and she tolerates the medication well.   Her last MRI was  performed at [facility] November 2015.  MRI of the brain from 03/02/2013 showed  typical periventricular T2/FLAIR hyperintense foci, predominantly in the periventricular white matter.   There were no acute foci.   REVIEW OF SYSTEMS: Constitutional: No fevers, chills, sweats, or change in appetite.   She notes fatigue. Eyes: No visual changes, double vision, eye pain Ear, nose and throat: No hearing loss, ear pain, nasal congestion, sore throat Cardiovascular: No chest pain, palpitations Respiratory: No shortness of breath at rest or with exertion.   No wheezes GastrointestinaI: No nausea, vomiting, diarrhea,  abdominal pain, fecal incontinence Genitourinary: No dysuria, urinary retention or frequency.  No nocturia. Musculoskeletal: No neck pain, back pain Integumentary: No rash, pruritus, skin lesions Neurological: as above Psychiatric: No depression at this time.  No anxiety Endocrine: No palpitations, diaphoresis, change in appetite, change in weigh or increased thirst Hematologic/Lymphatic: No anemia, purpura, petechiae. Allergic/Immunologic: No itchy/runny eyes, nasal congestion, recent allergic reactions, rashes  ALLERGIES: Allergies  Allergen Reactions  . Nsaids Other (See Comments)    Not supposed to take d/t bariatric surgery   . Ciprofloxacin Rash    HOME MEDICATIONS:  Current Outpatient Prescriptions:  .  acetaminophen (TYLENOL) 325 MG tablet, Take 650 mg by mouth every 4 (four) hours as needed., Disp: , Rfl:  .  baclofen (LIORESAL) 10 MG tablet, Take 1 tablet (10 mg total) by mouth 3 (three) times daily., Disp: 30 each, Rfl: 0 .  calcium-vitamin D (OSCAL WITH D) 500-200 MG-UNIT per tablet, Take 1 tablet by mouth daily with breakfast. Reported on 09/19/2015, Disp: , Rfl:  .  folic acid (FOLVITE) 1 MG tablet, Take 2 mg by mouth daily., Disp: , Rfl:  .  GILENYA 0.5 MG CAPS, Take 1 capsule (0.5 mg total) by mouth daily., Disp: 90 capsule, Rfl: 3 .  hydrochlorothiazide (MICROZIDE) 12.5 MG capsule, Take 12.5 mg by mouth daily. , Disp: , Rfl:  .  lisinopril (PRINIVIL,ZESTRIL) 40 MG tablet, TAKE 1 TABLET DAILY, Disp: 90 tablet, Rfl: 2 .  MedroxyPROGESTERone Acetate 150 MG/ML SUSY, Inject 1 mL into the skin every 3 (three) months. , Disp: , Rfl:  .  modafinil (PROVIGIL) 200 MG tablet, Take 1 tablet (200 mg total) by mouth daily., Disp: 30 tablet, Rfl: 5 .  Multiple Vitamin (MULTI-VITAMINS) TABS, Take 1 tablet by mouth daily. , Disp: , Rfl:  No current facility-administered medications for this visit.   Facility-Administered Medications Ordered in Other Visits:  .  gadopentetate  dimeglumine (MAGNEVIST) injection 20 mL, 20 mL, Intravenous, Once PRN, Asa Lente, MD  PAST MEDICAL HISTORY: Past Medical History:  Diagnosis Date  . Anemia   . Hypertension   . MS (multiple sclerosis) (HCC)   . Vision abnormalities   . Vitamin B 12 deficiency   . Vitamin D deficiency     PAST SURGICAL HISTORY: Past Surgical History:  Procedure Laterality Date  . CHOLECYSTECTOMY    . GASTRIC BYPASS      FAMILY HISTORY: Family History  Problem Relation Age of Onset  . Stroke Mother   . Heart disease Mother   . Asthma Mother   . Hypertension Father   . Heart disease Father   . Diabetes Father   . Asthma Brother   . Alcoholism Brother   . Obesity Brother   . Heart disease Brother   . Breast cancer Neg Hx     SOCIAL HISTORY:  Social History   Social History  . Marital status: Single  Spouse name: N/A  . Number of children: N/A  . Years of education: N/A   Occupational History  . Not on file.   Social History Main Topics  . Smoking status: Never Smoker  . Smokeless tobacco: Never Used  . Alcohol use No  . Drug use: Unknown  . Sexual activity: Not on file   Other Topics Concern  . Not on file   Social History Narrative  . No narrative on file     PHYSICAL EXAM  Vitals:   10/21/16 0902  BP: 114/72  Resp: 18  Weight: 280 lb 8 oz (127.2 kg)  Height: 5\' 4"  (1.626 m)    Body mass index is 48.15 kg/m.   General: The patient is well-developed and well-nourished and in no acute distress   Neck: The neck has good ROM  Neurologic Exam  Mental status: The patient is alert and oriented x 3 at the time of the examination. The patient has apparent normal recent and remote memory, with an apparently normal attention span and concentration ability.   Speech is normal.  Cranial nerves: Extraocular movements are full.   Facial strength and sensation is normal. Trapezius and sternocleidomastoid strength is normal. No dysarthria is noted.  The tongue  is midline, and the patient has symmetric elevation of the soft palate. No obvious hearing deficits are noted.  Motor:  Muscle bulk is normal.   Tone is normal. Strength is  5 / 5 in all 4 extremities.   Sensory: Sensory testing is intact to touch and vibration sensation in all 4 extremities.  Coordination: Cerebellar testing reveals good finger-nose-finger and heel-to-shin bilaterally.  Gait and station: Station is normal.   Gait is normal.   I can't appreciate a foot drop today. Her tandem gait is wide. Romberg is negative.   Reflexes: Deep tendon reflexes are symmetric and normal bilaterally.        DIAGNOSTIC DATA (LABS, IMAGING, TESTING) - I reviewed patient records, labs, notes, testing and imaging myself where available.  Lab Results  Component Value Date   WBC 3.7 08/20/2016   HGB 13.0 08/20/2016   HCT 38.4 08/20/2016   MCV 87.3 08/20/2016   PLT 201 08/20/2016      Component Value Date/Time   NA 139 03/07/2015 1045   NA 139 06/27/2014 1356   K 4.1 03/07/2015 1045   K 3.9 06/27/2014 1356   CL 107 03/07/2015 1045   CL 106 06/27/2014 1356   CO2 25 03/07/2015 1045   CO2 28 06/27/2014 1356   GLUCOSE 95 03/07/2015 1045   GLUCOSE 89 06/27/2014 1356   BUN 17 03/07/2015 1045   BUN 7 06/27/2014 1356   CREATININE 1.11 (H) 03/07/2015 1045   CREATININE 0.99 06/27/2014 1356   CALCIUM 9.0 03/07/2015 1045   CALCIUM 8.3 (L) 06/27/2014 1356   PROT 6.5 03/07/2015 1045   PROT 6.9 06/27/2014 1356   ALBUMIN 4.0 03/07/2015 1045   ALBUMIN 3.8 06/27/2014 1356   AST 20 03/07/2015 1045   AST 15 06/27/2014 1356   ALT 23 03/07/2015 1045   ALT 20 06/27/2014 1356   ALKPHOS 70 03/07/2015 1045   ALKPHOS 105 06/27/2014 1356   BILITOT 0.7 03/07/2015 1045   BILITOT 0.4 06/27/2014 1356   GFRNONAA 60 (L) 03/07/2015 1045   GFRNONAA >60 06/27/2014 1356   GFRNONAA >60 12/26/2013 1031   GFRAA >60 03/07/2015 1045   GFRAA >60 06/27/2014 1356   GFRAA >60 12/26/2013 1031    Lab Results  Component Value Date   VITAMINB12 251 05/28/2015   Lab Results  Component Value Date   TSH 1.887 03/07/2015       ASSESSMENT AND PLAN  Multiple sclerosis (HCC)  Gait disturbance  Numbness  Other fatigue  Urinary frequency    1.   Continue Gilenya.    We will check an MRI of the brain later this year to determine if there has been any subclinical progression. If this is occurring we will need to consider a change to a different disease modifying therapy.  2.   Restart Provigil for shift work work sleep disturbance, fatigue and sleepiness 3,   Continue baclofen for spasticity. 4.   Tandem Plus for mineral supplementation (she was on in past) 5.   she will return in 6 months or sooner if there are new or worsening neurologic symptoms.    Anjelina Dung A. Epimenio Foot, MD, PhD 10/21/2016, 9:12 AM Certified in Neurology, Clinical Neurophysiology, Sleep Medicine, Pain Medicine and Neuroimaging  [facility] [address] [place], [place] [address] [phone]

## 2016-10-22 ENCOUNTER — Telehealth: Payer: Self-pay | Admitting: *Deleted

## 2016-10-22 LAB — CBC WITH DIFFERENTIAL/PLATELET
Basophils Absolute: 0 10*3/uL (ref 0.0–0.2)
Basos: 0 %
EOS (ABSOLUTE): 0 10*3/uL (ref 0.0–0.4)
Eos: 0 %
Hematocrit: 38.8 % (ref 34.0–46.6)
Hemoglobin: 12.2 g/dL (ref 11.1–15.9)
Immature Grans (Abs): 0 10*3/uL (ref 0.0–0.1)
Immature Granulocytes: 0 %
Lymphocytes Absolute: 0.2 10*3/uL — ABNORMAL LOW (ref 0.7–3.1)
Lymphs: 5 %
MCH: 28.2 pg (ref 26.6–33.0)
MCHC: 31.4 g/dL — ABNORMAL LOW (ref 31.5–35.7)
MCV: 90 fL (ref 79–97)
Monocytes Absolute: 0.6 10*3/uL (ref 0.1–0.9)
Monocytes: 16 %
Neutrophils Absolute: 3.2 10*3/uL (ref 1.4–7.0)
Neutrophils: 79 %
Platelets: 238 10*3/uL (ref 150–379)
RBC: 4.32 x10E6/uL (ref 3.77–5.28)
RDW: 14.1 % (ref 12.3–15.4)
WBC: 4 10*3/uL (ref 3.4–10.8)

## 2016-10-22 LAB — HEPATIC FUNCTION PANEL
ALT: 20 IU/L (ref 0–32)
AST: 21 IU/L (ref 0–40)
Albumin: 4.1 g/dL (ref 3.5–5.5)
Alkaline Phosphatase: 102 IU/L (ref 39–117)
BILIRUBIN TOTAL: 0.6 mg/dL (ref 0.0–1.2)
BILIRUBIN, DIRECT: 0.18 mg/dL (ref 0.00–0.40)
Total Protein: 6.2 g/dL (ref 6.0–8.5)

## 2016-10-22 NOTE — Telephone Encounter (Signed)
-----   Message from Asa Lente, MD sent at 10/22/2016  3:40 PM EDT ----- Please let the patient know that the lab work is fine.  Low lymphocytes are expected with Gilenya.

## 2016-10-22 NOTE — Telephone Encounter (Signed)
LMOM that per RAS, labs done in our office look ok.  Lymphocytes are some low, but as expected on Gilenya.  No med changes at this time.  She does not need to return this call unless she has questions/fim

## 2016-11-03 ENCOUNTER — Other Ambulatory Visit: Payer: Self-pay | Admitting: Family Medicine

## 2016-11-13 ENCOUNTER — Encounter: Payer: Self-pay | Admitting: *Deleted

## 2016-11-13 ENCOUNTER — Encounter: Payer: Self-pay | Admitting: Nurse Practitioner

## 2016-11-13 ENCOUNTER — Ambulatory Visit (INDEPENDENT_AMBULATORY_CARE_PROVIDER_SITE_OTHER): Payer: 59 | Admitting: Nurse Practitioner

## 2016-11-13 VITALS — BP 136/72 | HR 79 | Temp 97.8°F | Resp 16 | Ht 63.0 in | Wt 274.0 lb

## 2016-11-13 DIAGNOSIS — M792 Neuralgia and neuritis, unspecified: Secondary | ICD-10-CM

## 2016-11-13 DIAGNOSIS — B027 Disseminated zoster: Secondary | ICD-10-CM | POA: Diagnosis not present

## 2016-11-13 MED ORDER — GABAPENTIN 100 MG PO CAPS: 100.0000 mg | ORAL_CAPSULE | Freq: Three times a day (TID) | ORAL | 0 refills | Status: DC | PRN

## 2016-11-13 MED ORDER — VALACYCLOVIR HCL 1 G PO TABS: 1000.0000 mg | ORAL_TABLET | Freq: Three times a day (TID) | ORAL | 0 refills | Status: AC

## 2016-11-13 NOTE — Progress Notes (Signed)
Subjective:    Patient ID: Jody Lee, female    DOB: Dec 15, 1970, 46 y.o.   MRN: [id number]  Jody Lee is a 46 y.o. female presenting on 11/13/2016 for possible shingles   HPI  Jody Lee is here today for shingles evaluation and treatment.  Onset of symptoms was 2 weeks ago with a rash under R breast. That looked different from her usual rash secondary to pendulous breasts and skin tissue.  She used OTC lotrimin and kept it dry and clean.  This week she noticed it clearing up, then noticed a spread toward L breast and progression to the top of her R breast.  Yesterday she noticed irritation and burning pain under arm and mid-posterior bra-line that wraps around to below her breast on the R front. There was no rash on her back until last night.    Had chicken pox at 46 yo.  She is also on immunosuppressive agents to treat her MS. These are prescribed by her neurologist.  She has two children that have both received the chicken pox vaccine.  Social History  Substance Use Topics  . Smoking status: Never Smoker  . Smokeless tobacco: Never Used  . Alcohol use No    Review of Systems Per HPI unless specifically indicated above     Objective:    BP 136/72 (BP Location: Left Arm, Patient Position: Sitting, Cuff Size: Large)   Pulse 79   Temp 97.8 F (36.6 C) (Oral)   Resp 16   Ht 5\' 3"  (1.6 m)   Wt 274 lb (124.3 kg)   BMI 48.54 kg/m   Wt Readings from Last 3 Encounters:  11/13/16 274 lb (124.3 kg)  10/21/16 280 lb 8 oz (127.2 kg)  08/21/16 270 lb 1 oz (122.5 kg)    Physical Exam  Constitutional: She is oriented to person, place, and time. She appears well-developed. No distress.  Morbidly obese  HENT:  Head: Normocephalic and atraumatic.  Neurological: She is alert and oriented to person, place, and time.  Skin: Skin is warm and dry. Rash noted. Rash is vesicular.     Affected dermatomes: T4-5, C6/T1  Psychiatric: She has a normal mood and affect. Her behavior  is normal.   Left Breast - vesicular rash vs. moisture associated skin erythema    Right Breast T5 (location 1 above)     Right Back T4-5 (location 3 above)        Assessment & Plan:   Problem List Items Addressed This Visit    None    Visit Diagnoses    Disseminated herpes zoster    -  Primary 1. Rash is affecting dermatomes T4-5. 2. Neuralgia prodrome is affecting either dermatome C6 or T1. 3. Discussed spread of herpes zoster.  Immunized people or people with chicken pox history are not likely to be affected.  Avoid being in the same room with other immunocompromised or unvaccinated people until lesions have all scabbed over. 4. Take valacyclovir 1,000 mg tablet three times per day for 7 days. 5. Discussed emergencies related to herpes zoster.  If pain or rash develops around her face or eye, seek care emergently.  Call clinic for referral to opthalmology or to urgent care or ER.   Relevant Medications   valACYclovir (VALTREX) 1000 MG tablet   Neuralgia     1. For nerve pain take gabapentin 100 mg capsule three times per day as needed. 2. May also take acetaminophen [phone] mg every 4-6 hours.  Limit to total intake of 3,000 mg daily.  Can alternate with ibuprofen 400-600 mg every 4-6 hours.    Relevant Medications   gabapentin (NEURONTIN) 100 MG capsule      Meds ordered this encounter  Medications  . valACYclovir (VALTREX) 1000 MG tablet    Sig: Take 1 tablet (1,000 mg total) by mouth 3 (three) times daily.    Dispense:  20 tablet    Refill:  0    Order Specific Question:   Supervising Provider    Answer:   Smitty Cords [[id number]]  . gabapentin (NEURONTIN) 100 MG capsule    Sig: Take 1 capsule (100 mg total) by mouth 3 (three) times daily as needed (as needed for nerve pain).    Dispense:  90 capsule    Refill:  0    Order Specific Question:   Supervising Provider    Answer:   Smitty Cords [[id number]]    Follow up plan: Return 1-2 weeks if  symptoms worsen or fail to improve.  [name], DNP, AGPCNP-BC Adult Gerontology Primary Care Nurse Practitioner [facility] 11/13/2016, 1:26 PM

## 2016-11-13 NOTE — Progress Notes (Signed)
I have reviewed this encounter including the documentation in this note and/or discussed this patient with the provider, [name], AGPCNP-BC. I am certifying that I agree with the content of this note as supervising physician.  Saralyn Pilar, DO [facility] 11/13/2016, 5:19 PM

## 2016-11-13 NOTE — Patient Instructions (Signed)
Jody Lee, Thank you for coming in to clinic today.  1. You have shingles.  Please take valacyclovir 1,000 mg tablet three times per day for 7 days.  IF you have any pain or rash on your face or eye.  Let me know and we will get you to opthalmology urgently.  You can take acetaminophen [phone] mg every 4-6 hours.  Limit to total intake of 3,000 mg daily.  Alternate with ibuprofen 400-600 mg every 4-6 hours.  Please schedule a follow-up appointment with [name], AGNP in 1-2 weeks.  If you have any other questions or concerns, please feel free to call the clinic or send a message through MyChart. You may also schedule an earlier appointment if necessary.  [name], DNP, AGNP-BC Adult Gerontology Nurse Practitioner [facility]   Shingles Shingles, which is also known as herpes zoster, is an infection that causes a painful skin rash and fluid-filled blisters. Shingles is not related to genital herpes, which is a sexually transmitted infection. Shingles only develops in people who:  Have had chickenpox.  Have received the chickenpox vaccine. (This is rare.) What are the causes? Shingles is caused by varicella-zoster virus (VZV). This is the same virus that causes chickenpox. After exposure to VZV, the virus stays in the body in an inactive (dormant) state. Shingles develops if the virus reactivates. This can happen many years after the initial exposure to VZV. It is not known what causes this virus to reactivate. What increases the risk? People who have had chickenpox or received the chickenpox vaccine are at risk for shingles. Infection is more common in people who:  Are older than age 24.  Have a weakened defense (immune) system, such as those with HIV, AIDS, or cancer.  Are taking medicines that weaken the immune system, such as transplant medicines.  Are under great stress. What are the signs or symptoms? Early symptoms of this condition include  itching, tingling, and pain in an area on your skin. Pain may be described as burning, stabbing, or throbbing. A few days or weeks after symptoms start, a painful red rash appears, usually on one side of the body in a bandlike or beltlike pattern. The rash eventually turns into fluid-filled blisters that break open, scab over, and dry up in about 2-3 weeks. At any time during the infection, you may also develop:  A fever.  Chills.  A headache.  An upset stomach. How is this diagnosed? This condition is diagnosed with a skin exam. Sometimes, skin or fluid samples are taken from the blisters before a diagnosis is made. These samples are examined under a microscope or sent to a lab for testing. How is this treated? There is no specific cure for this condition. Your health care provider will probably prescribe medicines to help you manage pain, recover more quickly, and avoid long-term problems. Medicines may include:  Antiviral drugs.  Anti-inflammatory drugs.  Pain medicines. If the area involved is on your face, you may be referred to a specialist, such as an eye doctor (ophthalmologist) or an ear, nose, and throat (ENT) doctor to help you avoid eye problems, chronic pain, or disability. Follow these instructions at home: Medicines   Take medicines only as directed by your health care provider.  Apply an anti-itch or numbing cream to the affected area as directed by your health care provider. Blister and Rash Care   Take a cool bath or apply cool compresses to the area of the rash or  blisters as directed by your health care provider. This may help with pain and itching.  Keep your rash covered with a loose bandage (dressing). Wear loose-fitting clothing to help ease the pain of material rubbing against the rash.  Keep your rash and blisters clean with mild soap and cool water or as directed by your health care provider.  Check your rash every day for signs of infection. These  include redness, swelling, and pain that lasts or increases.  Do not pick your blisters.  Do not scratch your rash. General instructions   Rest as directed by your health care provider.  Keep all follow-up visits as directed by your health care provider. This is important.  Until your blisters scab over, your infection can cause chickenpox in people who have never had it or been vaccinated against it. To prevent this from happening, avoid contact with other people, especially:  Babies.  Pregnant women.  Children who have eczema.  Elderly people who have transplants.  People who have chronic illnesses, such as leukemia or AIDS. Contact a health care provider if:  Your pain is not relieved with prescribed medicines.  Your pain does not get better after the rash heals.  Your rash looks infected. Signs of infection include redness, swelling, and pain that lasts or increases. Get help right away if:  The rash is on your face or nose.  You have facial pain, pain around your eye area, or loss of feeling on one side of your face.  You have ear pain or you have ringing in your ear.  You have loss of taste.  Your condition gets worse. This information is not intended to replace advice given to you by your health care provider. Make sure you discuss any questions you have with your health care provider. Document Released: 07/27/2005 Document Revised: 03/22/2016 Document Reviewed: 06/07/2014 Elsevier Interactive Patient Education  2017 [employer].

## 2016-11-16 ENCOUNTER — Encounter: Payer: Self-pay | Admitting: Neurology

## 2016-11-18 ENCOUNTER — Inpatient Hospital Stay: Payer: 59

## 2016-11-19 ENCOUNTER — Inpatient Hospital Stay: Payer: 59

## 2016-11-19 ENCOUNTER — Other Ambulatory Visit: Payer: 59

## 2016-11-19 ENCOUNTER — Inpatient Hospital Stay: Payer: 59 | Admitting: Oncology

## 2016-11-24 ENCOUNTER — Other Ambulatory Visit: Payer: Self-pay | Admitting: Nurse Practitioner

## 2016-11-24 MED ORDER — VALACYCLOVIR HCL 1 G PO TABS: 1000.0000 mg | ORAL_TABLET | Freq: Two times a day (BID) | ORAL | 0 refills | Status: AC

## 2016-11-24 NOTE — Telephone Encounter (Signed)
Pt states she had finished her valacyclovir prescription.  Burning pain had resolved.  She now notices burning pain returning.  Continue valacyclovir for another 4 days.

## 2016-11-26 ENCOUNTER — Ambulatory Visit (INDEPENDENT_AMBULATORY_CARE_PROVIDER_SITE_OTHER): Payer: 59

## 2016-11-26 DIAGNOSIS — N912 Amenorrhea, unspecified: Secondary | ICD-10-CM

## 2016-11-26 DIAGNOSIS — Z3042 Encounter for surveillance of injectable contraceptive: Secondary | ICD-10-CM

## 2016-11-26 LAB — POCT URINE PREGNANCY: Preg Test, Ur: NEGATIVE

## 2016-11-26 MED ORDER — MEDROXYPROGESTERONE ACETATE 150 MG/ML IM SUSP
150.0000 mg | Freq: Once | INTRAMUSCULAR | Status: AC
  Administered 2016-11-26: 150 mg via INTRAMUSCULAR

## 2016-12-22 ENCOUNTER — Other Ambulatory Visit: Payer: Self-pay | Admitting: Certified Nurse Midwife

## 2016-12-22 DIAGNOSIS — Z1231 Encounter for screening mammogram for malignant neoplasm of breast: Secondary | ICD-10-CM

## 2017-01-08 ENCOUNTER — Encounter: Payer: Self-pay | Admitting: Hematology and Oncology

## 2017-01-08 ENCOUNTER — Inpatient Hospital Stay: Payer: 59 | Attending: Hematology and Oncology

## 2017-01-08 ENCOUNTER — Inpatient Hospital Stay: Payer: 59

## 2017-01-08 ENCOUNTER — Inpatient Hospital Stay (HOSPITAL_BASED_OUTPATIENT_CLINIC_OR_DEPARTMENT_OTHER): Payer: 59 | Admitting: Hematology and Oncology

## 2017-01-08 ENCOUNTER — Other Ambulatory Visit: Payer: Self-pay | Admitting: Hematology and Oncology

## 2017-01-08 VITALS — BP 145/96 | HR 59 | Temp 98.6°F | Resp 18 | Wt 279.4 lb

## 2017-01-08 DIAGNOSIS — Z79899 Other long term (current) drug therapy: Secondary | ICD-10-CM

## 2017-01-08 DIAGNOSIS — R5383 Other fatigue: Secondary | ICD-10-CM | POA: Diagnosis not present

## 2017-01-08 DIAGNOSIS — Z9049 Acquired absence of other specified parts of digestive tract: Secondary | ICD-10-CM

## 2017-01-08 DIAGNOSIS — E559 Vitamin D deficiency, unspecified: Secondary | ICD-10-CM

## 2017-01-08 DIAGNOSIS — I10 Essential (primary) hypertension: Secondary | ICD-10-CM | POA: Diagnosis not present

## 2017-01-08 DIAGNOSIS — D508 Other iron deficiency anemias: Secondary | ICD-10-CM

## 2017-01-08 DIAGNOSIS — E538 Deficiency of other specified B group vitamins: Secondary | ICD-10-CM

## 2017-01-08 DIAGNOSIS — D509 Iron deficiency anemia, unspecified: Secondary | ICD-10-CM

## 2017-01-08 DIAGNOSIS — G35 Multiple sclerosis: Secondary | ICD-10-CM

## 2017-01-08 DIAGNOSIS — Z9884 Bariatric surgery status: Secondary | ICD-10-CM | POA: Diagnosis not present

## 2017-01-08 LAB — CBC WITH DIFFERENTIAL/PLATELET
Basophils Absolute: 0 10*3/uL (ref 0–0.1)
Basophils Relative: 1 %
Eosinophils Absolute: 0 10*3/uL (ref 0–0.7)
Eosinophils Relative: 0 %
HCT: 36.7 % (ref 35.0–47.0)
Hemoglobin: 12.6 g/dL (ref 12.0–16.0)
Lymphocytes Relative: 5 %
Lymphs Abs: 0.2 10*3/uL — ABNORMAL LOW (ref 1.0–3.6)
MCH: 30.3 pg (ref 26.0–34.0)
MCHC: 34.3 g/dL (ref 32.0–36.0)
MCV: 88.2 fL (ref 80.0–100.0)
Monocytes Absolute: 0.5 10*3/uL (ref 0.2–0.9)
Monocytes Relative: 12 %
Neutro Abs: 3.9 10*3/uL (ref 1.4–6.5)
Neutrophils Relative %: 84 %
Platelets: 224 10*3/uL (ref 150–440)
RBC: 4.16 MIL/uL (ref 3.80–5.20)
RDW: 14.5 % (ref 11.5–14.5)
WBC: 4.7 10*3/uL (ref 3.6–11.0)

## 2017-01-08 LAB — IRON AND TIBC
Iron: 77 ug/dL (ref 28–170)
Saturation Ratios: 28 % (ref 10.4–31.8)
TIBC: 280 ug/dL (ref 250–450)
UIBC: 203 ug/dL

## 2017-01-08 LAB — FERRITIN: Ferritin: 50 ng/mL (ref 11–307)

## 2017-01-08 MED ORDER — CYANOCOBALAMIN 1000 MCG/ML IJ SOLN
1000.0000 ug | Freq: Once | INTRAMUSCULAR | Status: AC
  Administered 2017-01-08: 1000 ug via INTRAMUSCULAR
  Filled 2017-01-08: qty 1

## 2017-01-08 NOTE — Progress Notes (Signed)
[facility]-  Cancer Center  Clinic day:  01/08/17   Chief Complaint: BEDIE DOMINEY is a 46 y.o. female status post gastric bypass surgery and subsequent iron deficiency and B12 deficiency who is seen for 4 1/2 month assessment.  HPI: The patient was last seen in the medical oncology clinic on 08/21/2016.  At that time, she was fatigued.  Exam revealed no palpable adenopathy or hepatosplenomegaly.  Hematocrit was 38.4.  Ferritin was 28.  She received Venofer 200 mg IV.  She received B12 monthly (09/21/2016, 10/25/2016).  She was scheduled to follow-up in 3 months.  She states that she had shingles on 11/18/2016.  She missed her B12.  She is doing well.She has been on Tandem plus for 6 weeks.   Past Medical History:  Diagnosis Date  . Anemia   . Hypertension   . MS (multiple sclerosis) (HCC)   . Vision abnormalities   . Vitamin B 12 deficiency   . Vitamin D deficiency     Past Surgical History:  Procedure Laterality Date  . CHOLECYSTECTOMY    . GASTRIC BYPASS      Family History  Problem Relation Age of Onset  . Stroke Mother   . Heart disease Mother   . Asthma Mother   . Hypertension Father   . Heart disease Father   . Diabetes Father   . Asthma Brother   . Alcoholism Brother   . Obesity Brother   . Heart disease Brother   . Breast cancer Neg Hx     Social History:  reports that she has never smoked. She has never used smokeless tobacco. She reports that she does not drink alcohol. Her drug history is not on file.  She recently moved from an apartment into a house.  She got married in 11/2016.  The patient is alone today.  Allergies:  Allergies  Allergen Reactions  . Nsaids Other (See Comments)    Not supposed to take d/t bariatric surgery   . Ciprofloxacin Rash    Current Medications: Current Outpatient Prescriptions  Medication Sig Dispense Refill  . acetaminophen (TYLENOL) 325 MG tablet Take 650 mg by mouth every 4 (four) hours as  needed.    . baclofen (LIORESAL) 10 MG tablet Take 1 tablet (10 mg total) by mouth 3 (three) times daily. 30 each 0  . FeFum-FePo-FA-B Cmp-C-Zn-Mn-Cu (TANDEM PLUS) 162-115.2-1 MG CAPS Take 1 tablet by mouth daily. 90 each 4  . folic acid (FOLVITE) 1 MG tablet Take 2 mg by mouth daily.    Marland Kitchen GILENYA 0.5 MG CAPS Take 1 capsule (0.5 mg total) by mouth daily. 90 capsule 3  . hydrochlorothiazide (MICROZIDE) 12.5 MG capsule Take 12.5 mg by mouth daily.     Marland Kitchen lisinopril (PRINIVIL,ZESTRIL) 40 MG tablet TAKE 1 TABLET DAILY 90 tablet 0  . MedroxyPROGESTERone Acetate 150 MG/ML SUSY Inject 1 mL into the skin every 3 (three) months.     . modafinil (PROVIGIL) 200 MG tablet Take 1 tablet (200 mg total) by mouth daily. 30 tablet 5  . gabapentin (NEURONTIN) 100 MG capsule Take 1 capsule (100 mg total) by mouth 3 (three) times daily as needed (as needed for nerve pain). (Patient not taking: Reported on 01/08/2017) 90 capsule 0   No current facility-administered medications for this visit.    Facility-Administered Medications Ordered in Other Visits  Medication Dose Route Frequency Provider Last Rate Last Dose  . gadopentetate dimeglumine (MAGNEVIST) injection 20 mL  20 mL Intravenous Once  PRN Sater, Pearletha Furl, MD        Review of Systems:  GENERAL:  Doing well. Feels "cold all the time".  No fevers.  Some night sweats.  Weight gain of 9 pounds. PERFORMANCE STATUS (ECOG):  1 HEENT:  No visual changes, runny nose, sore throat, mouth sores or tenderness. Lungs: No shortness of breath or cough.  No hemoptysis. Cardiac:  No chest pain, palpitations, orthopnea, or PND. GI:  No nausea, vomiting, diarrhea, constipation, melena or hematochezia. GU:  No urgency, frequency, dysuria, or hematuria. Musculoskeletal:  No pain or muscle tenderness. Extremities:  No pain or swelling. Skin:  No rashes or skin changes.  Interval shingles. Neuro:  Peripheral neuropathy.  No headache, numbness or weakness, balance or  coordination issues. Endocrine:  No diabetes, thyroid issues.  Hot flashes and  night sweats. Psych:  No mood changes, depression or anxiety. Pain:  No focal pain. Review of systems:  All other systems reviewed and found to be negative.   Physical Exam: Blood pressure (!) 145/96, pulse (!) 59, temperature 98.6 F (37 C), temperature source Tympanic, resp. rate 18, weight 279 lb 7 oz (126.8 kg). GENERAL:  Well developed, well nourished, heavyset woman sitting comfortably in the exam room in no acute distress. MENTAL STATUS:  Alert and oriented to person, place and time. HEAD:  Shoulder length brown hair.  Normocephalic, atraumatic, face symmetric, no Cushingoid features. EYES:  Glasses.  Blue eyes.  Pupils equal round and reactive to light and accomodation.  No conjunctivitis or scleral icterus. ENT:  Oropharynx clear without lesion.  Dentures.  Tongue normal. Mucous membranes moist.  RESPIRATORY:  Clear to auscultation without rales, wheezes or rhonchi. CARDIOVASCULAR:  Regular rate and rhythm without murmur, rub or gallop. ABDOMEN:  Fully round.  Soft, non-tender, with active bowel sounds, and no appreciable hepatosplenomegaly.  No masses. SKIN:  Abdominal striae.  No rashes, ulcers or lesions. EXTREMITIES: No edema, no skin discoloration or tenderness.  No palpable cords. LYMPH NODES: No palpable cervical, supraclavicular, axillary or inguinal adenopathy  NEUROLOGICAL: Unremarkable. PSYCH:  Appropriate.   Appointment on 01/08/2017  Component Date Value Ref Range Status  . WBC 01/08/2017 4.7  3.6 - 11.0 K/uL Final  . RBC 01/08/2017 4.16  3.80 - 5.20 MIL/uL Final  . Hemoglobin 01/08/2017 12.6  12.0 - 16.0 g/dL Final  . HCT 16/05/9603 36.7  35.0 - 47.0 % Final  . MCV 01/08/2017 88.2  80.0 - 100.0 fL Final  . MCH 01/08/2017 30.3  26.0 - 34.0 pg Final  . MCHC 01/08/2017 34.3  32.0 - 36.0 g/dL Final  . RDW 54/04/8118 14.5  11.5 - 14.5 % Final  . Platelets 01/08/2017 224  150 - 440 K/uL  Final  . Neutrophils Relative % 01/08/2017 84  % Final  . Neutro Abs 01/08/2017 3.9  1.4 - 6.5 K/uL Final  . Lymphocytes Relative 01/08/2017 5  % Final  . Lymphs Abs 01/08/2017 0.2* 1.0 - 3.6 K/uL Final  . Monocytes Relative 01/08/2017 12  % Final  . Monocytes Absolute 01/08/2017 0.5  0.2 - 0.9 K/uL Final  . Eosinophils Relative 01/08/2017 0  % Final  . Eosinophils Absolute 01/08/2017 0.0  0 - 0.7 K/uL Final  . Basophils Relative 01/08/2017 1  % Final  . Basophils Absolute 01/08/2017 0.0  0 - 0.1 K/uL Final    Assessment:  XEE HOLLMAN is a 46 y.o. female  status post gastric bypass surgery (2003) with resultant iron deficiency anemia and B12  deficiency.  She is intolerant of oral iron.   She has iron deficiency.  She receives Venofer when her ferritin is < 30 (last 08/21/2016).  Ferritin has been followed:  62 on 01/04/2015, 73 on 02/22/2015, 40 on 03/25/2015, 10 on 05/28/2015, 61 on 08/21/2015, 20 on 11/12/2015, 56 on 02/12/2016, 33 on 03/27/2016, 79 on 05/20/2016, 45 on 06/26/2016, 28 on 08/20/2016, and 50 on 01/08/2017.  She has B12 deficiency.  She was initially treated with B12 IM, but has subsequently been on oral B12.  B12 was sub-therapeutic on 06/27/2014.  She received weekly B12 x 6 (01/04/2015-02/22/2015) followed by monthly B12 (last 06/26/2016).  Folic acid was normal (12.5) on 01/25/2015.  She takes daily folic acid.    She has multiple sclerosis on Gilenya (fingolimod). There is a 4-7% risk of lymphopenia.  There is a < 1%, post marketing/case reports of lymphoma.  She had shingles in 11/2016.  Symptomatically, she is fatigued.  Exam reveals no palpable adenopathy or hepatosplenomegaly.  Hematocrit is 36.7.  Ferritin is 50.  Plan: 1.  Labs today:  CBC with diff, ferritin, iron studies. 2.  Discuss prior plan of Venofer when ferritin < 30 secondary to patient's symptoms.  3.  B12 today 4.  RTC monthly for B12 x 6 5.  Next B12 injection add lab: CBC, ferritin 6.  RTC in  6 months for MD assessment, labs (CBC with diff, ferritin) + B12   Rosey Bath, MD  01/08/2017, 3:03 PM

## 2017-01-08 NOTE — Progress Notes (Signed)
Patient had shingles in April.  She also got married the week before.  Patient states overall she is doing well.  Offers no other complaints today.  BP slightly elevated today @ 145/96 HR 59.

## 2017-01-29 ENCOUNTER — Ambulatory Visit: Payer: 59 | Admitting: Certified Nurse Midwife

## 2017-01-29 ENCOUNTER — Ambulatory Visit
Admission: RE | Admit: 2017-01-29 | Discharge: 2017-01-29 | Disposition: A | Discharge status: Home / Self Care | Payer: 59 | Source: Ambulatory Visit | Attending: Certified Nurse Midwife | Admitting: Certified Nurse Midwife

## 2017-01-29 ENCOUNTER — Ambulatory Visit (INDEPENDENT_AMBULATORY_CARE_PROVIDER_SITE_OTHER): Payer: 59 | Admitting: Certified Nurse Midwife

## 2017-01-29 ENCOUNTER — Encounter: Payer: Self-pay | Admitting: Certified Nurse Midwife

## 2017-01-29 VITALS — BP 140/90 | HR 58 | Ht 64.0 in | Wt 277.0 lb

## 2017-01-29 DIAGNOSIS — R87610 Atypical squamous cells of undetermined significance on cytologic smear of cervix (ASC-US): Secondary | ICD-10-CM | POA: Diagnosis not present

## 2017-01-29 DIAGNOSIS — Z1231 Encounter for screening mammogram for malignant neoplasm of breast: Secondary | ICD-10-CM | POA: Diagnosis not present

## 2017-01-29 DIAGNOSIS — N930 Postcoital and contact bleeding: Secondary | ICD-10-CM | POA: Diagnosis not present

## 2017-01-29 DIAGNOSIS — Z124 Encounter for screening for malignant neoplasm of cervix: Secondary | ICD-10-CM | POA: Diagnosis not present

## 2017-01-29 DIAGNOSIS — Z01419 Encounter for gynecological examination (general) (routine) without abnormal findings: Secondary | ICD-10-CM | POA: Diagnosis not present

## 2017-01-29 NOTE — Progress Notes (Signed)
Gynecology Annual Exam  PCP: Galen Manila, NP  Chief Complaint:  Chief Complaint  Patient presents with  . Gynecologic Exam    History of Present Illness:Jody Lee is a 46 year old Caucasian/White female , G 2 P 1 0 1 1 , who presents for her annual exam . She is having problems with postcoital spotting over the last few months. The bleeding usually lasts 1 day. Denies pain with intercourse or dryness. Denies unusual discharge or vulvar itching. She does spot randomly on the Depo Provera. Last spotting was 2 weeks ago.  Does have some cramping randomly x 1-2 days even if she does not bleed. The cramping is usually helped with Tylenol or ibuprofen. Has urinary frequency chronically. Last Depo injection was 11/26/2016.  Depo was started to treat menorrhagia in 2015   The patient's past medical history is notable for a history of bariatric surgery, anemia due to her surgery which requires iron transfusions every 3-6 months and vitamin B12 injections monthly, hypertension, and multiple sclerosis.  Since her last annual GYN exam dated 01/24/2016 , she has had shingles (April 2018) and she recently married (April 2018). She has also become a grandmother and a mother of two stepchildren.  Her most recent pap smear was obtained 01/24/2016 and was ASCUS/negative HRHPV.  Her most recent mammogram obtained on 01/29/2017 (today) was normal and revealed no significant changes.  There is no family history of breast cancer.  There is no family history of ovarian cancer.  The patient does do occ monthly self breast exams.  The patient does not smoke.  The patient does not drink alcohol.  The patient does not use illegal drugs.  The patient does not exercise other than doing some yardwork and has gained 17# in the last year. Her current BMI=47.55 kg/m2  The patient does not get adequate calcium in her diet.  She had a recent cholesterol screen in 2017 by her PCP    The patient denies  current symptoms of depression.    Review of Systems: Review of Systems  Constitutional: Negative for chills, fever and weight loss.       Positive for weight gain  HENT: Negative for congestion, sinus pain and sore throat.   Eyes: Negative for blurred vision and pain.  Respiratory: Negative for hemoptysis, shortness of breath and wheezing.   Cardiovascular: Negative for chest pain, palpitations and leg swelling.  Gastrointestinal: Positive for heartburn. Negative for abdominal pain, blood in stool, diarrhea, nausea and vomiting.  Genitourinary: Negative for dysuria, frequency, hematuria and urgency.       See HPI  Musculoskeletal: Negative for back pain, joint pain and myalgias.  Skin: Positive for rash (shingles right side of chest/back). Negative for itching.  Neurological: Negative for dizziness, tingling and headaches.  Endo/Heme/Allergies: Negative for environmental allergies and polydipsia. Does not bruise/bleed easily.       Negative for hirsutism   Psychiatric/Behavioral: Negative for depression. The patient is not nervous/anxious and does not have insomnia.     Past Medical History:  Past Medical History:  Diagnosis Date  . Anemia   . Hypertension   . Menorrhagia   . MS (multiple sclerosis) (HCC)   . Shingles    right side of chest and back  . Vision abnormalities   . Vitamin B 12 deficiency   . Vitamin D deficiency     Past Surgical History:  Past Surgical History:  Procedure Laterality Date  . CHOLECYSTECTOMY    .  GASTRIC BYPASS      Family History:  Family History  Problem Relation Age of Onset  . Stroke Mother   . Heart disease Mother   . Asthma Mother   . Diabetes Mother   . Hypertension Father   . Heart disease Father   . Diabetes Father   . Asthma Brother   . Alcoholism Brother   . Obesity Brother   . Heart disease Brother   . Diabetes Maternal Grandmother   . Lung cancer Maternal Grandfather   . Breast cancer Neg Hx     Social History:    Social History   Social History  . Marital status: Single    Spouse name: N/A  . Number of children: 1  . Years of education: N/A   Occupational History  . Programmer    Social History Main Topics  . Smoking status: Never Smoker  . Smokeless tobacco: Never Used  . Alcohol use No  . Drug use: No  . Sexual activity: Yes    Birth control/ protection: Injection   Other Topics Concern  . Not on file   Social History Narrative   Remarried in 2018. Has one daughter, one grandchild and 2 step children. Works from home    Allergies:  Allergies  Allergen Reactions  . Nsaids Other (See Comments)    Not supposed to take d/t bariatric surgery   . Ciprofloxacin Rash    Medications: Prior to Admission medications   Medication Sig Start Date End Date Taking? Authorizing Provider  acetaminophen (TYLENOL) 325 MG tablet Take 650 mg by mouth every 4 (four) hours as needed.    [provider]  baclofen (LIORESAL) 10 MG tablet Take 1 tablet (10 mg total) by mouth 3 (three) times daily. 03/20/16   Sater, Pearletha Furl, MD  FeFum-FePo-FA-B Cmp-C-Zn-Mn-Cu (TANDEM PLUS) 162-115.2-1 MG CAPS Take 1 tablet by mouth daily. 10/21/16   Sater, Pearletha Furl, MD  folic acid (FOLVITE) 1 MG tablet Take 2 mg by mouth daily.    [provider]         GILENYA 0.5 MG CAPS Take 1 capsule (0.5 mg total) by mouth daily. 03/20/16   Sater, Pearletha Furl, MD  hydrochlorothiazide (MICROZIDE) 12.5 MG capsule Take 12.5 mg by mouth daily.  07/03/14   [provider]  lisinopril (PRINIVIL,ZESTRIL) 40 MG tablet TAKE 1 TABLET DAILY 11/03/16   Janeann Forehand., MD  MedroxyPROGESTERone Acetate 150 MG/ML SUSY Inject 1 mL into the skin every 3 (three) months.  05/20/15   [provider]  modafinil (PROVIGIL) 200 MG tablet Take 1 tablet (200 mg total) by mouth daily. 10/21/16   Sater, Pearletha Furl, MD  Biotin, Claritin, omeprazole 20 mgm  Physical Exam Vitals: BP 140/90   Pulse (!) 58   Ht 5\' 4"   (1.626 m)   Wt 125.6 kg (277 lb)   BMI 47.55 kg/m   General: pleasant WF in NAD HEENT: normocephalic, anicteric Neck: no thyroid enlargement, no palpable nodules, no cervical lymphadenopathy  Pulmonary: No increased work of breathing, CTAB  Chest: macular hyperpigmented rash on right side of thoracic back due to shingles Cardiovascular: RRR, without murmur  Breast: Breasts soft, no tenderness, no palpable nodules or masses, no skin or nipple retraction present, no nipple discharge.  No axillary, infraclavicular or supraclavicular lymphadenopathy. Abdomen: Soft, non-tender, obese with large pannus, non-distended.  Umbilicus without lesions.  No hepatomegaly or masses palpable. No evidence of hernia. Genitourinary:  External: Normal external female genitalia.  Normal urethral meatus, normal Bartholin's and Skene's glands.    Vagina: Normal vaginal mucosa, no evidence of prolapse.    Cervix: Grossly normal in appearance,  Blood in cervix,  non-tender  Uterus: Midplane,  normal size, shape, and consistency, mobile, and non-tender  Adnexa: No adnexal masses, non-tender  Rectal: deferred  Lymphatic: no evidence of inguinal lymphadenopathy Extremities: no edema, erythema, or tenderness Neurologic: Grossly intact Psychiatric: mood appropriate, affect full     Assessment: 45 y.o. annual gyn exam Postcoital spotting   Plan:    1) Breast cancer screening - recommend monthly self breast exam and annual mammograms.  Mammogram is up to date.  2) Cervical cancer screening - Pap was done. ASCCP guidelines and rational discussed.  Patient opts for yearly screening interval  3) Contraception- Depo Provera refilled x 1 year. Next inj due 12 July  4) Routine healthcare maintenance including cholesterol and diabetes screening managed by PCP   5) Pelvic ultrasound ordered -will have her schedule for when Depo is due and follow up with me  Farrel Conners, CNM  Addendum: Ask about history  of colonoscopy when returns for pelvic ultrasound. If not current, needs colon cancer screening Farrel Conners, CNM

## 2017-01-31 ENCOUNTER — Encounter: Payer: Self-pay | Admitting: Certified Nurse Midwife

## 2017-02-01 LAB — IGP, APTIMA HPV
HPV Aptima: NEGATIVE
PAP Smear Comment: 0

## 2017-02-02 ENCOUNTER — Other Ambulatory Visit: Payer: Self-pay

## 2017-02-02 ENCOUNTER — Encounter: Payer: Self-pay | Admitting: Certified Nurse Midwife

## 2017-02-02 DIAGNOSIS — I10 Essential (primary) hypertension: Secondary | ICD-10-CM

## 2017-02-02 MED ORDER — LISINOPRIL 40 MG PO TABS: 40.0000 mg | ORAL_TABLET | Freq: Every day | ORAL | 0 refills | Status: DC

## 2017-02-05 ENCOUNTER — Inpatient Hospital Stay: Payer: 59

## 2017-02-05 DIAGNOSIS — D508 Other iron deficiency anemias: Secondary | ICD-10-CM

## 2017-02-05 DIAGNOSIS — D509 Iron deficiency anemia, unspecified: Secondary | ICD-10-CM | POA: Diagnosis not present

## 2017-02-05 DIAGNOSIS — E538 Deficiency of other specified B group vitamins: Secondary | ICD-10-CM

## 2017-02-05 LAB — CBC WITH DIFFERENTIAL/PLATELET
Basophils Absolute: 0 10*3/uL (ref 0–0.1)
Basophils Relative: 0 %
Eosinophils Absolute: 0 10*3/uL (ref 0–0.7)
Eosinophils Relative: 0 %
HCT: 36.9 % (ref 35.0–47.0)
Hemoglobin: 12.8 g/dL (ref 12.0–16.0)
Lymphocytes Relative: 7 %
Lymphs Abs: 0.2 10*3/uL — ABNORMAL LOW (ref 1.0–3.6)
MCH: 30 pg (ref 26.0–34.0)
MCHC: 34.7 g/dL (ref 32.0–36.0)
MCV: 86.3 fL (ref 80.0–100.0)
Monocytes Absolute: 0.4 10*3/uL (ref 0.2–0.9)
Monocytes Relative: 12 %
Neutro Abs: 2.9 10*3/uL (ref 1.4–6.5)
Neutrophils Relative %: 81 %
Platelets: 219 10*3/uL (ref 150–440)
RBC: 4.27 MIL/uL (ref 3.80–5.20)
RDW: 13.7 % (ref 11.5–14.5)
WBC: 3.6 10*3/uL (ref 3.6–11.0)

## 2017-02-05 LAB — FERRITIN: Ferritin: 55 ng/mL (ref 11–307)

## 2017-02-05 MED ORDER — CYANOCOBALAMIN 1000 MCG/ML IJ SOLN
1000.0000 ug | Freq: Once | INTRAMUSCULAR | Status: AC
  Administered 2017-02-05: 1000 ug via INTRAMUSCULAR

## 2017-02-15 ENCOUNTER — Ambulatory Visit (INDEPENDENT_AMBULATORY_CARE_PROVIDER_SITE_OTHER): Payer: 59

## 2017-02-15 DIAGNOSIS — Z3042 Encounter for surveillance of injectable contraceptive: Secondary | ICD-10-CM

## 2017-02-15 MED ORDER — MEDROXYPROGESTERONE ACETATE 150 MG/ML IM SUSP
150.0000 mg | Freq: Once | INTRAMUSCULAR | Status: AC
  Administered 2017-02-15: 150 mg via INTRAMUSCULAR

## 2017-02-22 ENCOUNTER — Other Ambulatory Visit: Payer: Self-pay | Admitting: Certified Nurse Midwife

## 2017-02-22 DIAGNOSIS — N939 Abnormal uterine and vaginal bleeding, unspecified: Secondary | ICD-10-CM

## 2017-02-24 ENCOUNTER — Ambulatory Visit (INDEPENDENT_AMBULATORY_CARE_PROVIDER_SITE_OTHER): Payer: 59

## 2017-02-24 ENCOUNTER — Encounter: Payer: Self-pay | Admitting: Certified Nurse Midwife

## 2017-02-24 ENCOUNTER — Ambulatory Visit (INDEPENDENT_AMBULATORY_CARE_PROVIDER_SITE_OTHER): Payer: 59 | Admitting: Certified Nurse Midwife

## 2017-02-24 VITALS — BP 150/84 | HR 59 | Ht 64.0 in | Wt 277.0 lb

## 2017-02-24 DIAGNOSIS — N930 Postcoital and contact bleeding: Secondary | ICD-10-CM | POA: Diagnosis not present

## 2017-02-24 DIAGNOSIS — N921 Excessive and frequent menstruation with irregular cycle: Secondary | ICD-10-CM | POA: Diagnosis not present

## 2017-02-24 DIAGNOSIS — N939 Abnormal uterine and vaginal bleeding, unspecified: Secondary | ICD-10-CM

## 2017-02-24 DIAGNOSIS — Z1211 Encounter for screening for malignant neoplasm of colon: Secondary | ICD-10-CM

## 2017-03-02 NOTE — Progress Notes (Signed)
  HPI: 46 year old morbidly obese female on Depo Provera who complained of postcoital spotting/ random spotting over the last 2-3 months. She has also had random cramping, ever without bleeding.  A TVUS today reveals an EM stripe measuring 2.49m and 2 intramural fibroids: 16x18 mm and 6x661m  Pap from 6/22 was ASCUS with negative HRHPV. Will repeat in one year. Also discussed with her colon cancer screening options: colonoscopy, annual hemoccult on stool, Cologaurd. She would like annual hemmocult on stools. Given a [facility] collection kit to collect specimen at home and send to [facility].  PMHx: She  has a past medical history of Anemia; Hypertension; Menorrhagia; MS (multiple sclerosis) ([place] Shingles; Vision abnormalities; Vitamin B 12 deficiency; and Vitamin D deficiency. Also,  has a past surgical history that includes Gastric bypass and Cholecystectomy., family history includes Alcoholism in her brother; Asthma in her brother and mother; Diabetes in her father, maternal grandmother, and mother; Heart disease in her brother, father, and mother; Hypertension in her father; Lung cancer in her maternal grandfather; Obesity in her brother; Stroke in her mother.,  reports that she has never smoked. She has never used smokeless tobacco. She reports that she does not drink alcohol or use drugs.  She has a current medication list which includes the following prescription(s): acetaminophen, baclofen, biotin, tandem plus, folic acid, gilenya, hydrochlorothiazide, lisinopril, loratadine, medroxyprogesterone acetate, modafinil, and omeprazole, and the following Facility-Administered Medications: gadopentetate dimeglumine. Also, is allergic to nsaids and ciprofloxacin.  ROS  Objective: BP (!) 150/84   Pulse (!) 59   Ht _0  (1.626 m)   Wt 125.6 kg (277 lb)   BMI 47.55 kg/m   Physical examination Constitutional NAD, Conversant  Skin No rashes, lesions or ulceration.   Extremities: Moves  all appropriately.  Normal ROM for age. No lymphadenopathy.  Neuro: Grossly intact  Psych: Oriented to PPT.  Normal mood. Normal affect.   Assessment:  Spotting on Depo Provera-thin EM as would be expected on Depo, doubt any pathology present with thin EM  Plan: RTO 1 year for annual and prn Annual colon cancer screen with hemoccult on stool.  CoDalia HeadingCNM

## 2017-03-10 ENCOUNTER — Telehealth: Payer: Self-pay | Admitting: *Deleted

## 2017-03-10 MED ORDER — GILENYA 0.5 MG PO CAPS: 1.0000 | ORAL_CAPSULE | Freq: Every day | ORAL | 3 refills | Status: DC

## 2017-03-10 NOTE — Telephone Encounter (Signed)
Gilenya escribed to [facility] per faxed request/fim 

## 2017-03-12 ENCOUNTER — Inpatient Hospital Stay: Payer: 59

## 2017-03-16 ENCOUNTER — Inpatient Hospital Stay: Payer: 59 | Attending: Hematology and Oncology

## 2017-03-16 DIAGNOSIS — Z79899 Other long term (current) drug therapy: Secondary | ICD-10-CM | POA: Insufficient documentation

## 2017-03-16 DIAGNOSIS — E538 Deficiency of other specified B group vitamins: Secondary | ICD-10-CM

## 2017-03-16 MED ORDER — CYANOCOBALAMIN 1000 MCG/ML IJ SOLN
1000.0000 ug | Freq: Once | INTRAMUSCULAR | Status: AC
  Administered 2017-03-16: 1000 ug via INTRAMUSCULAR
  Filled 2017-03-16: qty 1

## 2017-04-16 ENCOUNTER — Inpatient Hospital Stay: Payer: 59 | Attending: Hematology and Oncology

## 2017-04-19 ENCOUNTER — Other Ambulatory Visit: Payer: Self-pay | Admitting: Family Medicine

## 2017-04-19 ENCOUNTER — Other Ambulatory Visit: Payer: Self-pay | Admitting: Certified Nurse Midwife

## 2017-04-19 ENCOUNTER — Other Ambulatory Visit: Payer: Self-pay | Admitting: Neurology

## 2017-04-19 DIAGNOSIS — I10 Essential (primary) hypertension: Secondary | ICD-10-CM

## 2017-04-19 NOTE — Telephone Encounter (Signed)
Pt pharmacy requesting a refill. She was last seen in our office back in April, but it was a acute visit for shingles.

## 2017-04-23 ENCOUNTER — Ambulatory Visit: Payer: 59 | Admitting: Neurology

## 2017-05-07 ENCOUNTER — Encounter: Payer: Self-pay | Admitting: Neurology

## 2017-05-07 ENCOUNTER — Ambulatory Visit (INDEPENDENT_AMBULATORY_CARE_PROVIDER_SITE_OTHER): Payer: 59 | Admitting: Neurology

## 2017-05-07 ENCOUNTER — Ambulatory Visit (INDEPENDENT_AMBULATORY_CARE_PROVIDER_SITE_OTHER): Payer: 59

## 2017-05-07 VITALS — BP 179/83 | HR 84 | Resp 18 | Ht 64.0 in | Wt 278.5 lb

## 2017-05-07 DIAGNOSIS — R5383 Other fatigue: Secondary | ICD-10-CM | POA: Diagnosis not present

## 2017-05-07 DIAGNOSIS — R350 Frequency of micturition: Secondary | ICD-10-CM

## 2017-05-07 DIAGNOSIS — R269 Unspecified abnormalities of gait and mobility: Secondary | ICD-10-CM | POA: Diagnosis not present

## 2017-05-07 DIAGNOSIS — R200 Anesthesia of skin: Secondary | ICD-10-CM | POA: Diagnosis not present

## 2017-05-07 DIAGNOSIS — G35 Multiple sclerosis: Secondary | ICD-10-CM

## 2017-05-07 DIAGNOSIS — Z3042 Encounter for surveillance of injectable contraceptive: Secondary | ICD-10-CM

## 2017-05-07 MED ORDER — MEDROXYPROGESTERONE ACETATE 150 MG/ML IM SUSP
150.0000 mg | Freq: Once | INTRAMUSCULAR | Status: AC
  Administered 2017-05-07: 150 mg via INTRAMUSCULAR

## 2017-05-07 NOTE — Progress Notes (Signed)
[facility]  PATIENT: Jody Lee DOB: 31-May-1971  REFERRING DOCTOR OR PCP:  Venora Maples SOURCE: patient and labs, notes in EMR and MRI on PACS  _________________________________   HISTORICAL  CHIEF COMPLAINT:  Chief Complaint  Patient presents with  . Multiple Sclerosis    Sts. she continues to tolerate Gilenya well. One wk. ago had one day of dizziness, but this has completely resolved.Since last ov, she had shingles--rash was almost circumferential around thoracic region, chest, right axilla. She has completely recovered./fim    HISTORY OF PRESENT ILLNESS:  Jody Lee is a 46 y.o. woman with MS diagnosed in 2005.     Update 05/07/2017: She is on Gilenya for her relapsing multiple sclerosis. She tolerates it well. She has not had any definite exacerbation recently, though one week ago she had one day where she was dizzy the symptoms have since resolved.   When she had the vertigo, she felt her gait was tilted to the one side.     For the most part, gait does well but the right foot drags sometimes.     She denies significant numbness or weakness,    She needs stronger glasses and one day she felt her vision was cloudy for several hours.   She has some urinary frequency and notes bladder pain at times.      In general, fatigue is ok most days but she has occasional times it is worse.   She walks one mile daily as exercise.    She also had one week in August with severe fatigue.  She felt back to baseline the next week.  Mood is doing well.     Provigil has helped her.     After the last visit, she developed shingles. She feels she has completely recovered from the shingles of the right thorax/axilla.    ______________________________ From 10/21/2016  MS:   She is on Gilenya and tolerates it well.     She has not had any definite exacerbation.   She has noted more rightleg spasms.   Spine MRI's showed several chronic plaques in her cervical spine and  one in the thoracic spine.,    Gait/strength/sensation:  She sometimes stumbles due to her right foot being slightly weak.    Sometimes she veers while walking.  No falls.      She denies any major problem with weakness or numbness. She has more trouble using a can opener and occasionally is dropping items.     She gets right foot cramps, helped by baclofen..     Bladder: She notes worsening urinary frequency and urgency. She does not have incontinence.   She has fewer spasms.    Vision: She denies any MS related visual problems.   She has new glasses  Fatigue/sleep: She reports physical and cognitive fatigue. She has noted a benefit with Provigil. Adderall was not well tolerated.     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   She also takes a lot of caffeine on days that she needs to stay up longer or if she is out of her Provigil. She has some difficulty falling asleep but once asleep often stays asleep.    Often she has difficulty turning her mind off.   Her fianc has noted that she moans that her sleep sometimes. She has not had any screaming, kicking or punching.  Mood/cognition: She  denies any major difficulty with depression or anxiety at this time. She notes some cognitive difficulty. This is mostly problems with focusing and distraction.  She feels cognition does better with Provigil as well .Marland Kitchen She strays mentally active.  Other: Her anemia is doing better and she gets intermittent iron infusions and B12 monthly.   MS History:   She was diagnosed with MS in 2005 after presenting with a spinal cord syndrome. She started with numbness and pain in the feet that worked its way up to her abdomen over a couple days.   Initially, she had an MRI of the spine that showed a plaque consistent with her symptoms. She was referred to Dr. Maple Hudson. He did a lumbar puncture and ordered an MRI of the brain. Both were consistent  with multiple sclerosis and I started to see her. Initially, she was placed on Betaseron and did very well for the first few years. Then around 2011 or 2012, she had an exacerbation and testing showed that she had developed antibodies against Betaseron. Therefore, she was switched to Gilenya at that time. She has been on Gilenya for the last 4+ years and has done well. Specifically, she has not reported any difficulties with exacerbations and she tolerates the medication well.   Her last MRI was performed at [facility] November 2015.  MRI of the brain from 03/02/2013 showed  typical periventricular T2/FLAIR hyperintense foci, predominantly in the periventricular white matter.   There were no acute foci.   REVIEW OF SYSTEMS: Constitutional: No fevers, chills, sweats, or change in appetite.   She notes fatigue. Eyes: No visual changes, double vision, eye pain Ear, nose and throat: No hearing loss, ear pain, nasal congestion, sore throat Cardiovascular: No chest pain, palpitations Respiratory: No shortness of breath at rest or with exertion.   No wheezes GastrointestinaI: No nausea, vomiting, diarrhea, abdominal pain, fecal incontinence Genitourinary: No dysuria, urinary retention or frequency.  No nocturia. Musculoskeletal: No neck pain, back pain Integumentary: No rash, pruritus, skin lesions Neurological: as above Psychiatric: No depression at this time.  No anxiety Endocrine: No palpitations, diaphoresis, change in appetite, change in weigh or increased thirst Hematologic/Lymphatic: No anemia, purpura, petechiae. Allergic/Immunologic: No itchy/runny eyes, nasal congestion, recent allergic reactions, rashes  ALLERGIES: Allergies  Allergen Reactions  . Nsaids Other (See Comments)    Not supposed to take d/t bariatric surgery   . Ciprofloxacin Rash    HOME MEDICATIONS:  Current Outpatient Prescriptions:  .  acetaminophen (TYLENOL) 325 MG tablet, Take 650 mg by mouth  every 4 (four) hours as needed., Disp: , Rfl:  .  baclofen (LIORESAL) 10 MG tablet, Take 1 tablet (10 mg total) by mouth 3 (three) times daily., Disp: 30 each, Rfl: 0 .  Biotin 10 MG CAPS, Take 1 capsule by mouth daily., Disp: , Rfl:  .  FeFum-FePo-FA-B Cmp-C-Zn-Mn-Cu (TANDEM PLUS) 162-115.2-1 MG CAPS, Take 1 tablet by mouth daily., Disp: 90 each, Rfl: 4 .  folic acid (FOLVITE) 1 MG tablet, Take 2 mg by mouth daily., Disp: , Rfl:  .  GILENYA 0.5 MG CAPS, Take 1 capsule (0.5 mg total) by mouth daily., Disp: 90 capsule, Rfl: 3 .  hydrochlorothiazide (MICROZIDE) 12.5 MG capsule, Take 12.5 mg by mouth daily. , Disp: , Rfl:  .  lisinopril (PRINIVIL,ZESTRIL) 40 MG tablet, TAKE 1 TABLET DAILY, Disp: 90 tablet, Rfl: 0 .  loratadine (CLARITIN) 10 MG tablet, Take 10 mg by mouth daily., Disp: , Rfl:  .  MedroxyPROGESTERone  Acetate 150 MG/ML SUSY, INJECT 1 ML (150 MG) INTRAMUSCULARLY EVERY 3 MONTHS, Disp: 1 mL, Rfl: 3 .  modafinil (PROVIGIL) 200 MG tablet, TAKE 1 TABLET BY MOUTH DAILY., Disp: 30 tablet, Rfl: 5 .  omeprazole (PRILOSEC) 20 MG capsule, Take 20 mg by mouth 2 (two) times daily as needed., Disp: , Rfl:  No current facility-administered medications for this visit.   Facility-Administered Medications Ordered in Other Visits:  .  gadopentetate dimeglumine (MAGNEVIST) injection 20 mL, 20 mL, Intravenous, Once PRN, Ammarie Matsuura, Pearletha Furl, MD  PAST MEDICAL HISTORY: Past Medical History:  Diagnosis Date  . Anemia   . Hypertension   . Menorrhagia   . MS (multiple sclerosis) (HCC)   . Shingles    right side of chest and back  . Vision abnormalities   . Vitamin B 12 deficiency   . Vitamin D deficiency     PAST SURGICAL HISTORY: Past Surgical History:  Procedure Laterality Date  . CHOLECYSTECTOMY    . GASTRIC BYPASS      FAMILY HISTORY: Family History  Problem Relation Age of Onset  . Stroke Mother   . Heart disease Mother   . Asthma Mother   . Diabetes Mother   . Hypertension Father   .  Heart disease Father   . Diabetes Father   . Asthma Brother   . Alcoholism Brother   . Obesity Brother   . Heart disease Brother   . Diabetes Maternal Grandmother   . Lung cancer Maternal Grandfather   . Breast cancer Neg Hx     SOCIAL HISTORY:  Social History   Social History  . Marital status: Single    Spouse name: N/A  . Number of children: 1  . Years of education: N/A   Occupational History  . Programmer    Social History Main Topics  . Smoking status: Never Smoker  . Smokeless tobacco: Never Used  . Alcohol use No  . Drug use: No  . Sexual activity: Yes    Birth control/ protection: Injection   Other Topics Concern  . Not on file   Social History Narrative   Remarried in 2018. Has one daughter, one grandchild and 2 step children. Works from home     PHYSICAL EXAM  Vitals:   05/07/17 0855  BP: (!) 179/83  Pulse: 84  Resp: 18  Weight: 278 lb 8 oz (126.3 kg)  Height:  (1.626 m)    Body mass index is 47.8 kg/m.   General: The patient is well-developed and well-nourished and in no acute distress   Neck: The neck has good ROM  Neurologic Exam  Mental status: The patient is alert and oriented x 3 at the time of the examination. The patient has apparent normal recent and remote memory, with an apparently normal attention span and concentration ability.   Speech is normal.  Cranial nerves: Extraocular muscles are normal. Facial strength and sensation is normal. Trapezius strength is normal. The tongue is midline, and the patient has symmetric elevation of the soft palate. No obvious hearing deficits are noted.  Motor:  Muscle bulk is normal.   Tone is normal. Strength is  5 / 5 in all 4 extremities.   Sensory: Sensory testing is intact to touch and vibration sensation in all 4 extremities.  Coordination: Cerebellar testing reveals good finger-nose-finger and heel-to-shin bilaterally.  Gait and station: Station is normal.   Gait is normal.   No  foot drop within the room. The tandem gait  is wide.. Romberg is negative.   Reflexes: Deep tendon reflexes are symmetric and normal bilaterally.        DIAGNOSTIC DATA (LABS, IMAGING, TESTING) - I reviewed patient records, labs, notes, testing and imaging myself where available.  Lab Results  Component Value Date   WBC 3.6 02/05/2017   HGB 12.8 02/05/2017   HCT 36.9 02/05/2017   MCV 86.3 02/05/2017   PLT 219 02/05/2017      Component Value Date/Time   NA 139 03/07/2015 1045   NA 139 06/27/2014 1356   K 4.1 03/07/2015 1045   K 3.9 06/27/2014 1356   CL 107 03/07/2015 1045   CL 106 06/27/2014 1356   CO2 25 03/07/2015 1045   CO2 28 06/27/2014 1356   GLUCOSE 95 03/07/2015 1045   GLUCOSE 89 06/27/2014 1356   BUN 17 03/07/2015 1045   BUN 7 06/27/2014 1356   CREATININE 1.11 (H) 03/07/2015 1045   CREATININE 0.99 06/27/2014 1356   CALCIUM 9.0 03/07/2015 1045   CALCIUM 8.3 (L) 06/27/2014 1356   PROT 6.2 10/21/2016 1023   PROT 6.9 06/27/2014 1356   ALBUMIN 4.1 10/21/2016 1023   ALBUMIN 3.8 06/27/2014 1356   AST 21 10/21/2016 1023   AST 15 06/27/2014 1356   ALT 20 10/21/2016 1023   ALT 20 06/27/2014 1356   ALKPHOS 102 10/21/2016 1023   ALKPHOS 105 06/27/2014 1356   BILITOT 0.6 10/21/2016 1023   BILITOT 0.4 06/27/2014 1356   GFRNONAA 60 (L) 03/07/2015 1045   GFRNONAA >60 06/27/2014 1356   GFRNONAA >60 12/26/2013 1031   GFRAA >60 03/07/2015 1045   GFRAA >60 06/27/2014 1356   GFRAA >60 12/26/2013 1031    Lab Results  Component Value Date   VITAMINB12 251 05/28/2015   Lab Results  Component Value Date   TSH 1.887 03/07/2015       ASSESSMENT AND PLAN  Multiple sclerosis (HCC) - Plan: MR BRAIN W WO CONTRAST, MR CERVICAL SPINE W WO CONTRAST  Gait disturbance - Plan: MR CERVICAL SPINE W WO CONTRAST  Other fatigue  Urinary frequency  Numbness   1.   She will continue Gilenya. We will check an MRI of the brain and cervical spine to determine if there has been  any subclinical progression of her MS. If present, consider a change in her disease modifying therapy.   2.   She will take Provigil for her shift work disorder and fatigue and sleepiness. 3,   spasms worsen again she can start the baclofen. 4.   Tandem Plus for mineral supplementation (she was on in past) 5.   she will return in 6 months or sooner if there are new or worsening neurologic symptoms.    Niala Stcharles A. Epimenio Foot, MD, PhD 05/07/2017, 9:34 AM Certified in Neurology, Clinical Neurophysiology, Sleep Medicine, Pain Medicine and Neuroimaging  [facility] [address] [place], [place] [address] [phone]

## 2017-05-10 ENCOUNTER — Ambulatory Visit: Payer: 59

## 2017-05-21 ENCOUNTER — Inpatient Hospital Stay: Payer: 59

## 2017-05-21 ENCOUNTER — Inpatient Hospital Stay: Payer: 59 | Attending: Hematology and Oncology

## 2017-05-21 DIAGNOSIS — Z79899 Other long term (current) drug therapy: Secondary | ICD-10-CM | POA: Diagnosis not present

## 2017-05-21 DIAGNOSIS — E538 Deficiency of other specified B group vitamins: Secondary | ICD-10-CM | POA: Insufficient documentation

## 2017-05-21 MED ORDER — CYANOCOBALAMIN 1000 MCG/ML IJ SOLN
1000.0000 ug | Freq: Once | INTRAMUSCULAR | Status: AC
  Administered 2017-05-21: 1000 ug via INTRAMUSCULAR

## 2017-06-01 ENCOUNTER — Other Ambulatory Visit: Payer: 59

## 2017-06-01 ENCOUNTER — Encounter: Payer: Self-pay | Admitting: Nurse Practitioner

## 2017-06-01 ENCOUNTER — Ambulatory Visit (INDEPENDENT_AMBULATORY_CARE_PROVIDER_SITE_OTHER): Payer: 59 | Admitting: Nurse Practitioner

## 2017-06-01 VITALS — BP 163/85 | HR 68 | Temp 97.7°F | Ht 64.0 in | Wt 281.2 lb

## 2017-06-01 DIAGNOSIS — Z23 Encounter for immunization: Secondary | ICD-10-CM | POA: Diagnosis not present

## 2017-06-01 DIAGNOSIS — B029 Zoster without complications: Secondary | ICD-10-CM | POA: Diagnosis not present

## 2017-06-01 MED ORDER — VALACYCLOVIR HCL 1 G PO TABS: 1000.0000 mg | ORAL_TABLET | Freq: Three times a day (TID) | ORAL | 0 refills | Status: AC

## 2017-06-01 MED ORDER — GABAPENTIN 100 MG PO CAPS: 100.0000 mg | ORAL_CAPSULE | Freq: Three times a day (TID) | ORAL | 0 refills | Status: DC

## 2017-06-01 NOTE — Progress Notes (Signed)
Subjective:    Patient ID: Jody Lee, female    DOB: Jan 28, 1971, 46 y.o.   MRN: [id number]  Jody Lee is a 46 y.o. female presenting on 06/01/2017 for Herpes Zoster (recurrent shingles on the Left side of the neck.)   HPI Painful rash Left neck Pt w/ herpes zoster infection 11/13/16.  Now has new painful rash in different location of upper arm, shoulder, left neck.  Prior outbreak was on torso.  No known poison ivy/oak exposure. For last 1-2 days, pt has burning pain on left neck/ear, posterior shoulder, top of arm.  Feeling is same as last shingles outbreak. - Last time she required 3 weeks for rash resolution plus 1 week for neuralgia.  Is immunocompromised r/t biologics for multiple sclerosis.   Social History  Substance Use Topics  . Smoking status: Never Smoker  . Smokeless tobacco: Never Used  . Alcohol use No    Review of Systems Per HPI unless specifically indicated above     Objective:    BP (!) 163/85 (BP Location: Right Arm, Patient Position: Sitting, Cuff Size: Normal)   Pulse 68   Temp 97.7 F (36.5 C) (Oral)   Ht 5\' 4"  (1.626 m)   Wt 281 lb 3.2 oz (127.6 kg)   BMI 48.27 kg/m   Wt Readings from Last 3 Encounters:  06/01/17 281 lb 3.2 oz (127.6 kg)  05/07/17 278 lb 8 oz (126.3 kg)  02/24/17 277 lb (125.6 kg)    Physical Exam  Constitutional: She is oriented to person, place, and time. She appears well-developed and well-nourished. No distress.  Neck: Normal range of motion. Neck supple.  Cardiovascular: Normal rate, regular rhythm and normal heart sounds.   Pulmonary/Chest: Effort normal and breath sounds normal. No respiratory distress.  Lymphadenopathy:    She has no cervical adenopathy.  Neurological: She is alert and oriented to person, place, and time. No cranial nerve deficit.  Skin: Skin is warm and dry. Rash noted.     Rash erupting at red circled areas.  Painful, macular, erythematous rash w/o vesicles.  Psychiatric: She has a normal  mood and affect. Her behavior is normal.      Results for orders placed or performed in visit on 02/05/17  CBC with Differential/Platelet  Result Value Ref Range   WBC 3.6 3.6 - 11.0 K/uL   RBC 4.27 3.80 - 5.20 MIL/uL   Hemoglobin 12.8 12.0 - 16.0 g/dL   HCT 40.9 81.1 - 91.4 %   MCV 86.3 80.0 - 100.0 fL   MCH 30.0 26.0 - 34.0 pg   MCHC 34.7 32.0 - 36.0 g/dL   RDW 78.2 95.6 - 21.3 %   Platelets 219 150 - 440 K/uL   Neutrophils Relative % 81 %   Neutro Abs 2.9 1.4 - 6.5 K/uL   Lymphocytes Relative 7 %   Lymphs Abs 0.2 (L) 1.0 - 3.6 K/uL   Monocytes Relative 12 %   Monocytes Absolute 0.4 0.2 - 0.9 K/uL   Eosinophils Relative 0 %   Eosinophils Absolute 0.0 0 - 0.7 K/uL   Basophils Relative 0 %   Basophils Absolute 0.0 0 - 0.1 K/uL  Ferritin  Result Value Ref Range   Ferritin 55 11 - 307 ng/mL      Assessment & Plan:   Problem List Items Addressed This Visit    None    Visit Diagnoses    Herpes zoster without complication    -  Primary Rash w/  pain is likely new shingles eruption.  Pt at higher risk of shingles w/ immunosuppressive therapy for multiple sclerosis.    Plan: 1. May benefit from discussing w/ neurology since is recurrence. - START valacyclovir 1,000 mg three times per day - START gabapentin 100 mg tablet.  Take 1-2 tablets three times daily as needed for burning pain. 2. Recommend shingles vaccine about 4-6 weeks after symptoms resolve.  Call insurance to ask about coverage. 3. Follow up as needed.   Relevant Medications   valACYclovir (VALTREX) 1000 MG tablet   gabapentin (NEURONTIN) 100 MG capsule   Flu vaccine need     Pt immunocompromised and desires flu vaccine.  Administer high dose fluzone today.   Relevant Orders   Flu vaccine HIGH DOSE PF (Fluzone High dose) (Completed)      Meds ordered this encounter  Medications  . DISCONTD: valACYclovir (VALTREX) 1000 MG tablet    Sig: Take 1,000 mg by mouth 2 (two) times daily.  . valACYclovir (VALTREX)  1000 MG tablet    Sig: Take 1 tablet (1,000 mg total) by mouth 3 (three) times daily.    Dispense:  42 tablet    Refill:  0    Order Specific Question:   Supervising Provider    Answer:   Smitty CordsKARAMALEGOS, ALEXANDER J [[id number]]  . gabapentin (NEURONTIN) 100 MG capsule    Sig: Take 1-2 capsules (100-200 mg total) by mouth 3 (three) times daily.    Dispense:  120 capsule    Refill:  0    Order Specific Question:   Supervising Provider    Answer:   Smitty CordsKARAMALEGOS, ALEXANDER J [[id number]]      Follow up plan: Return if symptoms worsen or fail to improve.  Jody McardleLauren Myya Meenach, DNP, AGPCNP-BC Adult Gerontology Primary Care Nurse Practitioner [facility] 06/09/2017, 1:17 PM

## 2017-06-01 NOTE — Patient Instructions (Addendum)
Jody Lee, Thank you for coming in to clinic today.  1. You likely have shingles again. - START valacyclovir 1,000 mg three times per day - START gabapentin 100 mg tablet.  Take 1-2 tablets three times daily as needed for burning pain.  2. Recommend shingles vaccine about 4-6 weeks after your symptoms resolve.  Call insurance to ask about coverage.  Please schedule a follow-up appointment with [name], AGNP. Return if symptoms worsen or fail to improve.  If you have any other questions or concerns, please feel free to call the clinic or send a message through MyChart. You may also schedule an earlier appointment if necessary.  You will receive a survey after today's visit either digitally by e-mail or paper by [employer]. Your experiences and feedback matter to [place].  Please respond so we know how we are doing as we provide care for you.   [name], DNP, AGNP-BC Adult Gerontology Nurse Practitioner [facility]

## 2017-06-04 IMAGING — US US EXTREM LOW VENOUS*R*
1 series · 13 of 24 positions shown · non-contrast
Comparison: None.

CLINICAL DATA: 43-year-old female with right lower extremity pain
and swelling for the past 2 weeks



[Series 1: us extrem low venous*right* · 0.11mm/px · 13 of 31 slices shown]
[im 1/31]
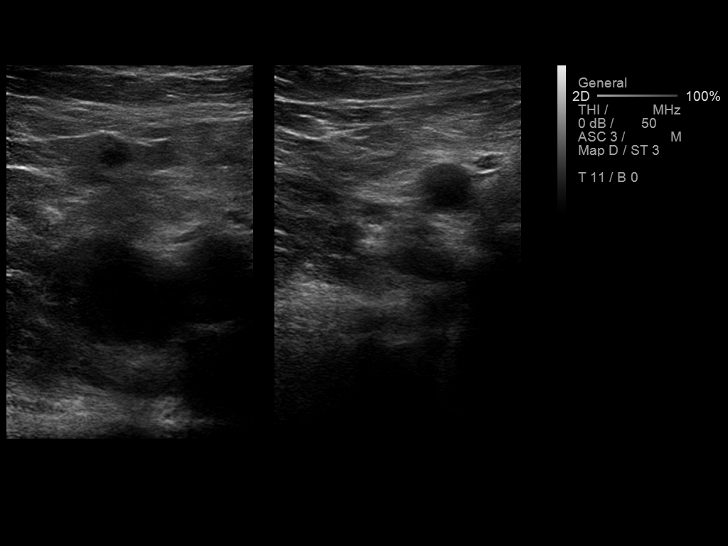
[im 3/31]
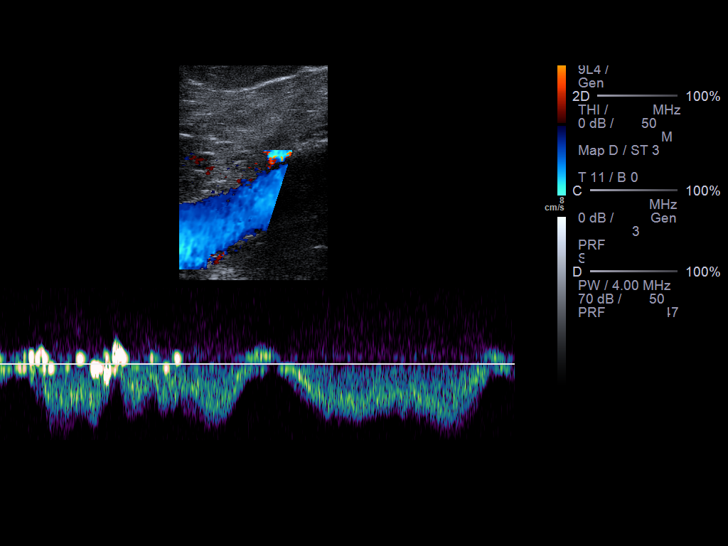
[im 6/31]
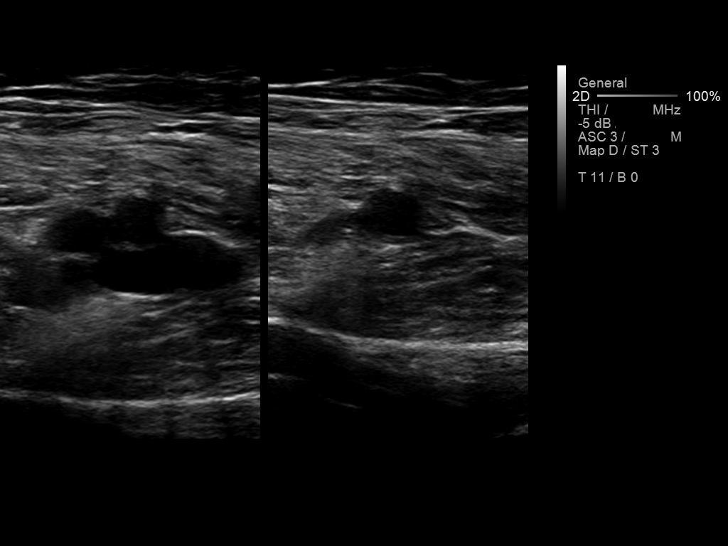
[im 8/31]
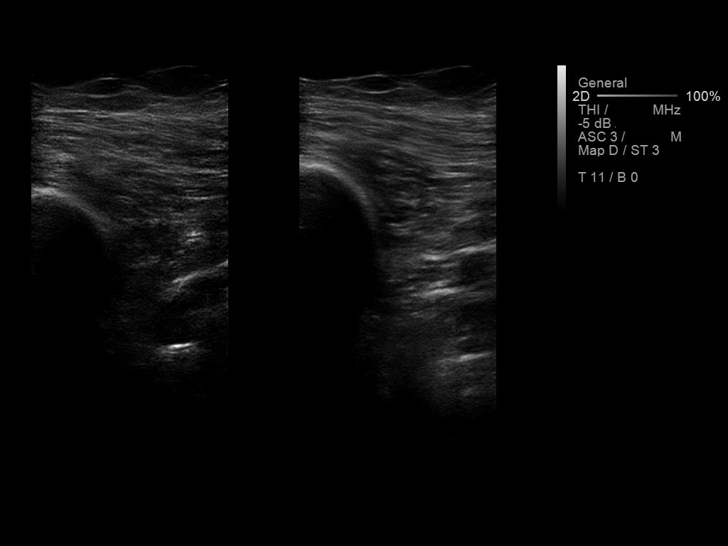
[im 11/31]
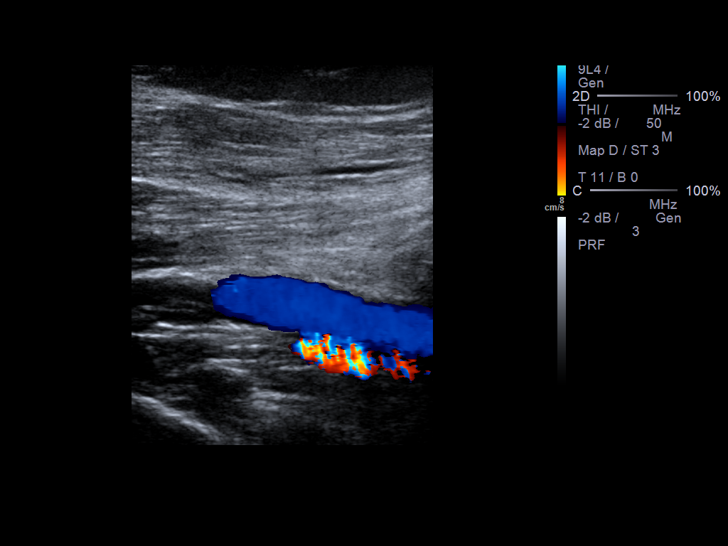
[im 14/31]
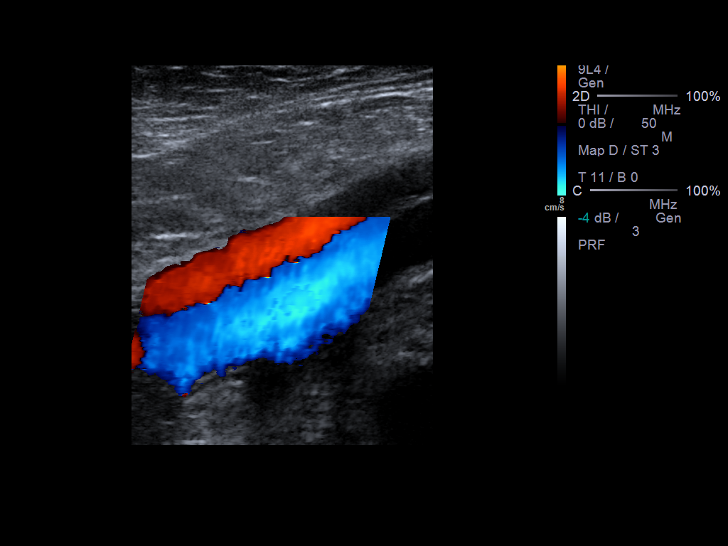
[im 16/31]
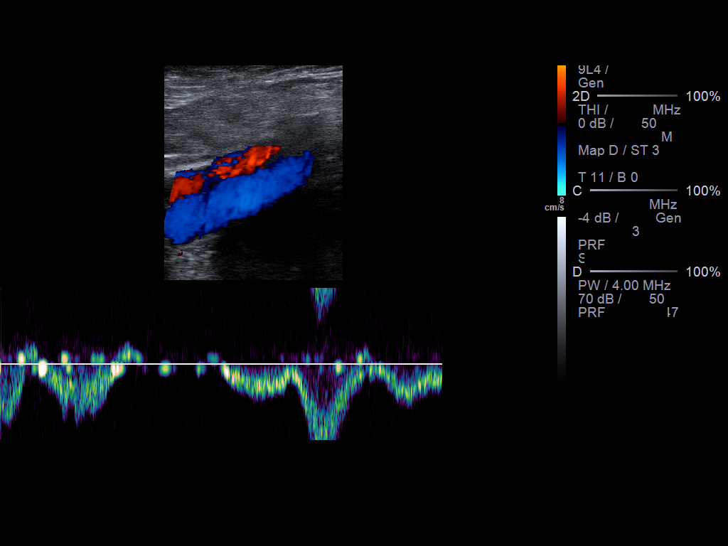
[im 17/31]
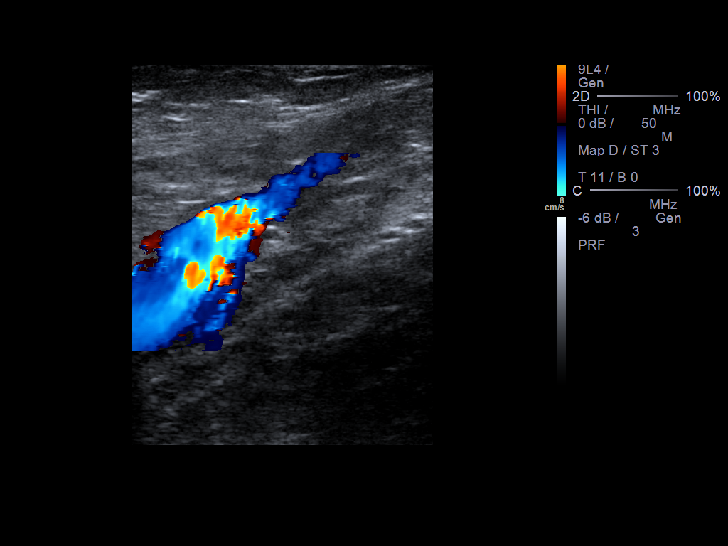
[im 20/31]
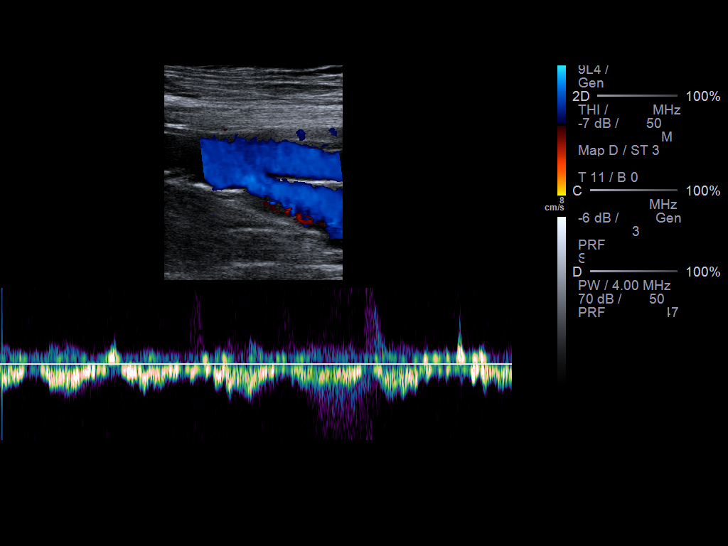
[im 23/31]
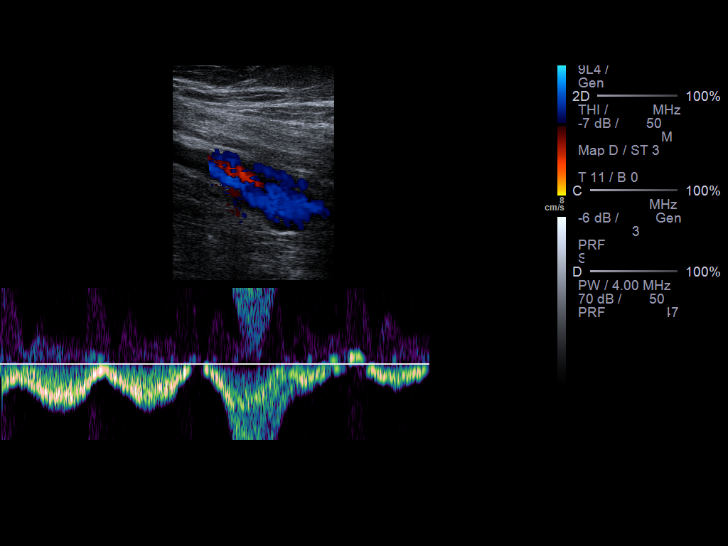
[im 25/31]
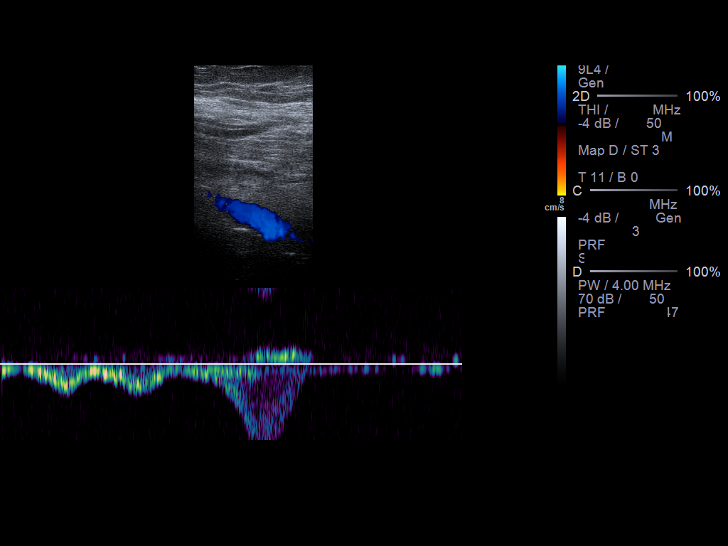
[im 28/31]
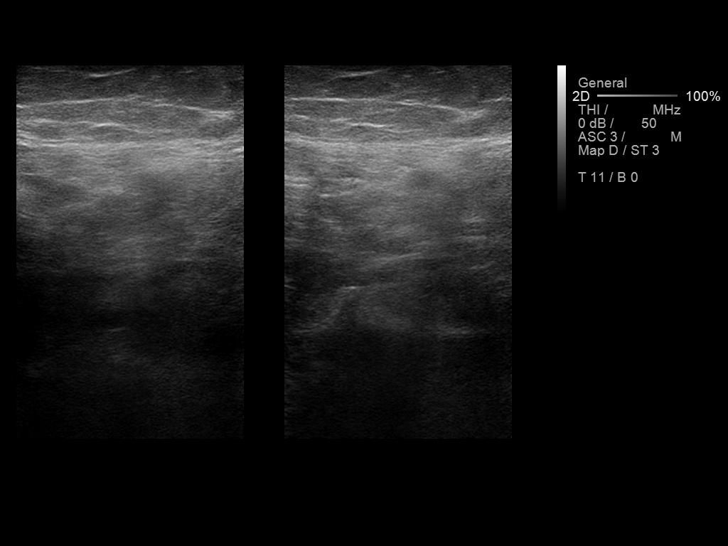
[im 31/31]
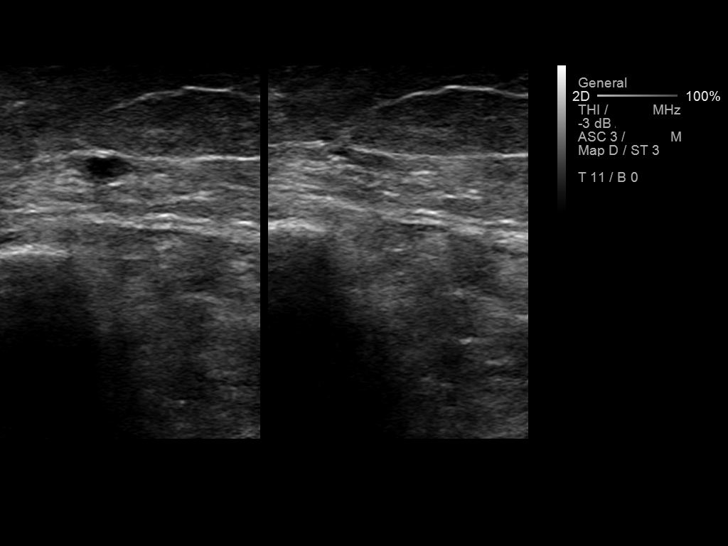

[13 of 24 positions shown; findings below may reference images not displayed]

FINDINGS: Contralateral Common Femoral Vein: Respiratory phasicity is normal
and symmetric with the symptomatic side. No evidence of thrombus.
Normal compressibility.

Common Femoral Vein: No evidence of thrombus. Normal
compressibility, respiratory phasicity and response to augmentation.

Saphenofemoral Junction: No evidence of thrombus. Normal
compressibility and flow on color Doppler imaging.

Profunda Femoral Vein: No evidence of thrombus. Normal
compressibility and flow on color Doppler imaging.

Femoral Vein: No evidence of thrombus. Normal compressibility,
respiratory phasicity and response to augmentation.

Popliteal Vein: No evidence of thrombus. Normal compressibility,
respiratory phasicity and response to augmentation.

Calf Veins: No evidence of thrombus. Normal compressibility and flow
on color Doppler imaging.

Superficial Great Saphenous Vein: No evidence of thrombus. Normal
compressibility and flow on color Doppler imaging.

Venous Reflux:  None.

Other Findings:  None.
IMPRESSION: No evidence of deep venous thrombosis.

## 2017-06-15 ENCOUNTER — Ambulatory Visit (INDEPENDENT_AMBULATORY_CARE_PROVIDER_SITE_OTHER): Payer: 59

## 2017-06-15 DIAGNOSIS — R269 Unspecified abnormalities of gait and mobility: Secondary | ICD-10-CM

## 2017-06-15 DIAGNOSIS — G35 Multiple sclerosis: Secondary | ICD-10-CM | POA: Diagnosis not present

## 2017-06-15 MED ORDER — GADOPENTETATE DIMEGLUMINE 469.01 MG/ML IV SOLN: 20.0000 mL | Freq: Once | INTRAVENOUS | Status: AC | PRN

## 2017-06-21 ENCOUNTER — Telehealth: Payer: Self-pay | Admitting: *Deleted

## 2017-06-21 NOTE — Telephone Encounter (Signed)
Spoke with Jody Lee this afternoon and gave MRI results as below.  She verbalized understanding of same, is not available to come in for an ov today.  Appt. sched. for 06/24/17 at 1330/fim

## 2017-06-21 NOTE — Telephone Encounter (Signed)
-----   Message from Asa Lente, MD sent at 06/20/2017  6:45 PM EST ----- Please let the patient know that the MRI of the brain shows one brand new lesion. The MRI of the cervical spine shows several old MS plaques and 1 plaque that did not appear to be present on the previous MRI.  I would like to see her to go over the MRI as we will need to consider a different treatment. I can see her Monday afternoon (after 3)

## 2017-06-24 ENCOUNTER — Encounter: Payer: Self-pay | Admitting: Neurology

## 2017-06-24 ENCOUNTER — Other Ambulatory Visit: Payer: Self-pay

## 2017-06-24 ENCOUNTER — Ambulatory Visit (INDEPENDENT_AMBULATORY_CARE_PROVIDER_SITE_OTHER): Payer: 59 | Admitting: Neurology

## 2017-06-24 VITALS — BP 118/76 | HR 78 | Resp 20 | Ht 64.0 in | Wt 280.5 lb

## 2017-06-24 DIAGNOSIS — R269 Unspecified abnormalities of gait and mobility: Secondary | ICD-10-CM

## 2017-06-24 DIAGNOSIS — Z79899 Other long term (current) drug therapy: Secondary | ICD-10-CM | POA: Diagnosis not present

## 2017-06-24 DIAGNOSIS — R200 Anesthesia of skin: Secondary | ICD-10-CM

## 2017-06-24 DIAGNOSIS — R5383 Other fatigue: Secondary | ICD-10-CM

## 2017-06-24 DIAGNOSIS — G35 Multiple sclerosis: Secondary | ICD-10-CM | POA: Diagnosis not present

## 2017-06-24 NOTE — Progress Notes (Signed)
[facility]  PATIENT: Jody Lee DOB: [birth year]  REFERRING DOCTOR OR PCP:  Venora Maples SOURCE: patient and labs, notes in EMR and MRI on PACS  _________________________________   HISTORICAL  CHIEF COMPLAINT:  Chief Complaint  Patient presents with  . Multiple Sclerosis    Last MRI showed one new lesion in brain and one new lesion in c-spine, despite compliance with Gilenya.  Here to discuss other tx. options/fim    HISTORY OF PRESENT ILLNESS:  Jody Lee is a 46 y.o. woman with MS diagnosed in 2005.     Update 06/24/2017:   I personally reviewed the results and images of her 06/15/2017 MRI.  She has one enhancing lesion on the MRI of the brain and also has a new lesion that is nonenhancing in the cervical spine.  She notes more muscle stiffness and more right hand tingling the past 2 months.    In detail, we discussed several options for treatment due to her breakthrough while on Gilenya. Specifically we went over the risks and benefits of Tysabri, Lemtrada and ocrelizumab. We will check blood work to determine if any of these are contraindicated. She is most interested in either Tysabri or ocrelizumab   Update 05/07/2017: She is on Gilenya for her relapsing multiple sclerosis. She tolerates it well. She has not had any definite exacerbation recently, though one week ago she had one day where she was dizzy the symptoms have since resolved.   When she had the vertigo, she felt her gait was tilted to the one side.     For the most part, gait does well but the right foot drags sometimes.     She denies significant numbness or weakness,    She needs stronger glasses and one day she felt her vision was cloudy for several hours.   She has some urinary frequency and notes bladder pain at times.      In general, fatigue is ok most days but she has occasional times it is worse.   She walks one mile daily as exercise.    She also had one week in August with  severe fatigue.  She felt back to baseline the next week.  Mood is doing well.     Provigil has helped her.     After the last visit, she developed shingles. She feels she has completely recovered from the shingles of the right thorax/axilla.    ______________________________ From 10/21/2016  MS:   She is on Gilenya and tolerates it well.     She has not had any definite exacerbation.   She has noted more rightleg spasms.   Spine MRI's showed several chronic plaques in her cervical spine and one in the thoracic spine.,    Gait/strength/sensation:  She sometimes stumbles due to her right foot being slightly weak.    Sometimes she veers while walking.  No falls.      She denies any major problem with weakness or numbness. She has more trouble using a can opener and occasionally is dropping items.     She gets right foot cramps, helped by baclofen..     Bladder: She notes worsening urinary frequency and urgency. She does not have incontinence.   She has fewer spasms.    Vision: She denies any MS related visual problems.   She has new glasses  Fatigue/sleep: She reports physical and cognitive fatigue. She has noted a benefit with Provigil. Adderall was not well tolerated.  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   She also takes a lot of caffeine on days that she needs to stay up longer or if she is out of her Provigil. She has some difficulty falling asleep but once asleep often stays asleep.    Often she has difficulty turning her mind off.   Her fianc has noted that she moans that her sleep sometimes. She has not had any screaming, kicking or punching.  Mood/cognition: She denies any major difficulty with depression or anxiety at this time. She notes some cognitive difficulty. This is mostly problems with focusing and distraction.  She feels cognition does better with Provigil as well .Marland Kitchen She strays mentally  active.  Other: Her anemia is doing better and she gets intermittent iron infusions and B12 monthly.   MS History:   She was diagnosed with MS in 2005 after presenting with a spinal cord syndrome. She started with numbness and pain in the feet that worked its way up to her abdomen over a couple days.   Initially, she had an MRI of the spine that showed a plaque consistent with her symptoms. She was referred to Dr. Maple Hudson. He did a lumbar puncture and ordered an MRI of the brain. Both were consistent with multiple sclerosis and I started to see her. Initially, she was placed on Betaseron and did very well for the first few years. Then around 2011 or 2012, she had an exacerbation and testing showed that she had developed antibodies against Betaseron. Therefore, she was switched to Gilenya at that time. She has been on Gilenya for the last 4+ years and has done well. Specifically, she has not reported any difficulties with exacerbations and she tolerates the medication well.   Her last MRI was performed at [facility] November 2015.  MRI of the brain from 03/02/2013 showed  typical periventricular T2/FLAIR hyperintense foci, predominantly in the periventricular white matter.   There were no acute foci.   REVIEW OF SYSTEMS: Constitutional: No fevers, chills, sweats, or change in appetite.   She notes fatigue. Eyes: No visual changes, double vision, eye pain Ear, nose and throat: No hearing loss, ear pain, nasal congestion, sore throat Cardiovascular: No chest pain, palpitations Respiratory: No shortness of breath at rest or with exertion.   No wheezes GastrointestinaI: No nausea, vomiting, diarrhea, abdominal pain, fecal incontinence Genitourinary: No dysuria, urinary retention or frequency.  No nocturia. Musculoskeletal: No neck pain, back pain Integumentary: No rash, pruritus, skin lesions Neurological: as above Psychiatric: No depression at this time.  No anxiety Endocrine: No  palpitations, diaphoresis, change in appetite, change in weigh or increased thirst Hematologic/Lymphatic: No anemia, purpura, petechiae. Allergic/Immunologic: No itchy/runny eyes, nasal congestion, recent allergic reactions, rashes  ALLERGIES: Allergies  Allergen Reactions  . Nsaids Other (See Comments)    Not supposed to take d/t bariatric surgery   . Ciprofloxacin Rash    HOME MEDICATIONS:  Current Outpatient Medications:  .  acetaminophen (TYLENOL) 325 MG tablet, Take 650 mg by mouth every 4 (four) hours as needed., Disp: , Rfl:  .  baclofen (LIORESAL) 10 MG tablet, Take 1 tablet (10 mg total) by mouth 3 (three) times daily., Disp: 30 each, Rfl: 0 .  Biotin 10 MG CAPS, Take 1 capsule by mouth daily., Disp: , Rfl:  .  FeFum-FePo-FA-B Cmp-C-Zn-Mn-Cu (TANDEM PLUS) 162-115.2-1 MG CAPS, Take 1 tablet by mouth daily., Disp:  90 each, Rfl: 4 .  folic acid (FOLVITE) 1 MG tablet, Take 2 mg by mouth daily., Disp: , Rfl:  .  GILENYA 0.5 MG CAPS, Take 1 capsule (0.5 mg total) by mouth daily., Disp: 90 capsule, Rfl: 3 .  hydrochlorothiazide (MICROZIDE) 12.5 MG capsule, Take 12.5 mg by mouth daily. , Disp: , Rfl:  .  lisinopril (PRINIVIL,ZESTRIL) 40 MG tablet, TAKE 1 TABLET DAILY, Disp: 90 tablet, Rfl: 0 .  loratadine (CLARITIN) 10 MG tablet, Take 10 mg by mouth daily., Disp: , Rfl:  .  MedroxyPROGESTERone Acetate 150 MG/ML SUSY, INJECT 1 ML (150 MG) INTRAMUSCULARLY EVERY 3 MONTHS, Disp: 1 mL, Rfl: 3 .  modafinil (PROVIGIL) 200 MG tablet, TAKE 1 TABLET BY MOUTH DAILY., Disp: 30 tablet, Rfl: 5 .  omeprazole (PRILOSEC) 20 MG capsule, Take 20 mg by mouth 2 (two) times daily as needed., Disp: , Rfl:  .  gabapentin (NEURONTIN) 100 MG capsule, Take 1-2 capsules (100-200 mg total) by mouth 3 (three) times daily., Disp: 120 capsule, Rfl: 0 No current facility-administered medications for this visit.   Facility-Administered Medications Ordered in Other Visits:  .  gadopentetate dimeglumine (MAGNEVIST)  injection 20 mL, 20 mL, Intravenous, Once PRN, Delpha Perko, Pearletha Furl, MD  PAST MEDICAL HISTORY: Past Medical History:  Diagnosis Date  . Anemia   . Hypertension   . Menorrhagia   . MS (multiple sclerosis) (HCC)   . Shingles    right side of chest and back  . Vision abnormalities   . Vitamin B 12 deficiency   . Vitamin D deficiency     PAST SURGICAL HISTORY: Past Surgical History:  Procedure Laterality Date  . CHOLECYSTECTOMY    . GASTRIC BYPASS      FAMILY HISTORY: Family History  Problem Relation Age of Onset  . Stroke Mother   . Heart disease Mother   . Asthma Mother   . Diabetes Mother   . Hypertension Father   . Heart disease Father   . Diabetes Father   . Asthma Brother   . Alcoholism Brother   . Obesity Brother   . Heart disease Brother   . Diabetes Maternal Grandmother   . Lung cancer Maternal Grandfather   . Breast cancer Neg Hx     SOCIAL HISTORY:  Social History   Socioeconomic History  . Marital status: Single    Spouse name: Not on file  . Number of children: 1  . Years of education: Not on file  . Highest education level: Not on file  Social Needs  . Financial resource strain: Not on file  . Food insecurity - worry: Not on file  . Food insecurity - inability: Not on file  . Transportation needs - medical: Not on file  . Transportation needs - non-medical: Not on file  Occupational History  . Occupation: Magazine features editor  Tobacco Use  . Smoking status: Never Smoker  . Smokeless tobacco: Never Used  Substance and Sexual Activity  . Alcohol use: No  . Drug use: No  . Sexual activity: Yes    Birth control/protection: Injection  Other Topics Concern  . Not on file  Social History Narrative   Remarried in 2018. Has one daughter, one grandchild and 2 step children. Works from home     PHYSICAL EXAM  Vitals:   06/24/17 1342  BP: 118/76  Pulse: 78  Resp: 20  Weight: 280 lb 8 oz (127.2 kg)  Height: 5\' 4"  (1.626 m)    Body mass index is  48.15 kg/m.   General: The patient is well-developed and well-nourished and in no acute distress   Neck: The neck has good ROM  Neurologic Exam  Mental status: The patient is alert and oriented x 3 at the time of the examination. The patient has apparent normal recent and remote memory, with an apparently normal attention span and concentration ability.   Speech is normal.  Cranial nerves: Extraocular muscles are normal. Facial strength and sensation is normal. Trapezius strength is normal.. No obvious hearing deficits are noted.  Motor:  Muscle bulk is normal.   Tone is normal. Strength is  5 / 5 in all 4 extremities.   Sensory: Sensory testing is intact to touch and vibration sensation in all 4 extremities.  Coordination: Cerebellar testing shows good finger-nose-finger and heel-to-shin..  Gait and station: Station is normal.   Gait is slightly wide.   No foot drop within the room. The tandem gait is wide.. Romberg is negative.   Reflexes: Deep tendon reflexes are symmetric and normal bilaterally.        DIAGNOSTIC DATA (LABS, IMAGING, TESTING) - I reviewed patient records, labs, notes, testing and imaging myself where available.  Lab Results  Component Value Date   WBC 3.6 02/05/2017   HGB 12.8 02/05/2017   HCT 36.9 02/05/2017   MCV 86.3 02/05/2017   PLT 219 02/05/2017      Component Value Date/Time   NA 139 03/07/2015 1045   NA 139 06/27/2014 1356   K 4.1 03/07/2015 1045   K 3.9 06/27/2014 1356   CL 107 03/07/2015 1045   CL 106 06/27/2014 1356   CO2 25 03/07/2015 1045   CO2 28 06/27/2014 1356   GLUCOSE 95 03/07/2015 1045   GLUCOSE 89 06/27/2014 1356   BUN 17 03/07/2015 1045   BUN 7 06/27/2014 1356   CREATININE 1.11 (H) 03/07/2015 1045   CREATININE 0.99 06/27/2014 1356   CALCIUM 9.0 03/07/2015 1045   CALCIUM 8.3 (L) 06/27/2014 1356   PROT 6.2 10/21/2016 1023   PROT 6.9 06/27/2014 1356   ALBUMIN 4.1 10/21/2016 1023   ALBUMIN 3.8 06/27/2014 1356   AST 21  10/21/2016 1023   AST 15 06/27/2014 1356   ALT 20 10/21/2016 1023   ALT 20 06/27/2014 1356   ALKPHOS 102 10/21/2016 1023   ALKPHOS 105 06/27/2014 1356   BILITOT 0.6 10/21/2016 1023   BILITOT 0.4 06/27/2014 1356   GFRNONAA 60 (L) 03/07/2015 1045   GFRNONAA >60 06/27/2014 1356   GFRNONAA >60 12/26/2013 1031   GFRAA >60 03/07/2015 1045   GFRAA >60 06/27/2014 1356   GFRAA >60 12/26/2013 1031    Lab Results  Component Value Date   VITAMINB12 251 05/28/2015   Lab Results  Component Value Date   TSH 1.887 03/07/2015       ASSESSMENT AND PLAN  Multiple sclerosis (HCC)  Numbness  Gait disturbance  Other fatigue   1.   Due to breakthrough disease while compliant on Gilenya, different disease modifying therapy needs to be considered.  In detail, we discussed several options for treatment due to her breakthrough while on Gilenya. Specifically we went over the risks and benefits of Tysabri, Lemtrada and ocrelizumab. We will check blood work to determine if any of these are contraindicated. She is most interested in either Tysabri or ocrelizumab 2.   Continue Provigil for her shift work disorder and fatigue and sleepiness. 3,    She will return for her IV treatment and in 4 months or  sooner if there are new or worsening neurologic symptoms.    Keelan Tripodi A. Epimenio Foot, MD, PhD 06/24/2017, 2:22 PM Certified in Neurology, Clinical Neurophysiology, Sleep Medicine, Pain Medicine and Neuroimaging  [facility] [address] [place], [place] [address] [phone]

## 2017-06-25 ENCOUNTER — Inpatient Hospital Stay: Payer: 59 | Attending: Hematology and Oncology

## 2017-06-25 ENCOUNTER — Inpatient Hospital Stay: Payer: 59

## 2017-06-25 DIAGNOSIS — Z79899 Other long term (current) drug therapy: Secondary | ICD-10-CM | POA: Diagnosis not present

## 2017-06-25 DIAGNOSIS — E538 Deficiency of other specified B group vitamins: Secondary | ICD-10-CM | POA: Diagnosis not present

## 2017-06-25 MED ORDER — CYANOCOBALAMIN 1000 MCG/ML IJ SOLN
1000.0000 ug | Freq: Once | INTRAMUSCULAR | Status: AC
  Administered 2017-06-25: 1000 ug via INTRAMUSCULAR

## 2017-06-27 LAB — COMPREHENSIVE METABOLIC PANEL
ALK PHOS: 114 IU/L (ref 39–117)
ALT: 19 IU/L (ref 0–32)
AST: 18 IU/L (ref 0–40)
Albumin/Globulin Ratio: 2 (ref 1.2–2.2)
Albumin: 4.3 g/dL (ref 3.5–5.5)
BILIRUBIN TOTAL: 0.2 mg/dL (ref 0.0–1.2)
BUN/Creatinine Ratio: 11 (ref 9–23)
BUN: 12 mg/dL (ref 6–24)
CHLORIDE: 106 mmol/L (ref 96–106)
CO2: 22 mmol/L (ref 20–29)
CREATININE: 1.08 mg/dL — AB (ref 0.57–1.00)
Calcium: 9.2 mg/dL (ref 8.7–10.2)
GFR calc Af Amer: 72 mL/min/{1.73_m2} (ref >59)
GFR calc non Af Amer: 62 mL/min/{1.73_m2} (ref >59)
GLOBULIN, TOTAL: 2.2 g/dL (ref 1.5–4.5)
GLUCOSE: 88 mg/dL (ref 65–99)
Potassium: 4.5 mmol/L (ref 3.5–5.2)
SODIUM: 144 mmol/L (ref 134–144)
Total Protein: 6.5 g/dL (ref 6.0–8.5)

## 2017-06-27 LAB — QUANTIFERON-TB GOLD PLUS
QUANTIFERON MITOGEN VALUE: 0.39 [IU]/mL
QUANTIFERON NIL VALUE: 0.04 [IU]/mL
QUANTIFERON TB1 AG VALUE: 0.04 [IU]/mL
QUANTIFERON TB2 AG VALUE: 0.03 [IU]/mL
QUANTIFERON-TB GOLD PLUS: UNDETERMINED — AB

## 2017-06-27 LAB — HEPATITIS B CORE ANTIBODY, TOTAL: HEP B C TOTAL AB: NEGATIVE

## 2017-06-27 LAB — HEPATITIS B SURFACE ANTIGEN: HEP B S AG: NEGATIVE

## 2017-06-27 LAB — HEPATITIS B SURFACE ANTIBODY,QUALITATIVE: HEP B SURFACE AB, QUAL: NONREACTIVE

## 2017-06-28 ENCOUNTER — Telehealth: Payer: Self-pay | Admitting: *Deleted

## 2017-06-28 ENCOUNTER — Telehealth: Payer: Self-pay | Admitting: Neurology

## 2017-06-28 DIAGNOSIS — Z79899 Other long term (current) drug therapy: Secondary | ICD-10-CM

## 2017-06-28 DIAGNOSIS — G35 Multiple sclerosis: Secondary | ICD-10-CM

## 2017-06-28 DIAGNOSIS — G35D Multiple sclerosis, unspecified: Secondary | ICD-10-CM

## 2017-06-28 DIAGNOSIS — R7612 Nonspecific reaction to cell mediated immunity measurement of gamma interferon antigen response without active tuberculosis: Secondary | ICD-10-CM

## 2017-06-28 MED ORDER — METHYLPREDNISOLONE 4 MG PO TABS: ORAL_TABLET | ORAL | 0 refills | Status: DC

## 2017-06-28 NOTE — Telephone Encounter (Signed)
Patient was taken off Gilenya last week but is having side effects-her hands are tingling worse, feels weird and tired. She would like a steroid called to [facility] on [address] in [place]. Please call and discuss.

## 2017-06-28 NOTE — Addendum Note (Signed)
Addended by: Candis Schatz I on: 06/28/2017 10:17 AM   Modules accepted: Orders

## 2017-06-28 NOTE — Telephone Encounter (Signed)
Spoke with Jody Lee and gave below test results.  She verbalized understanding of same, is agreeable with having CXR to r/o TB; would like this at [facility].  Order in Epic/fim

## 2017-06-28 NOTE — Telephone Encounter (Signed)
LMOM that per RAS, ok for Medrol dose pk.  Rx. sent to [facility] as requested/fim

## 2017-06-28 NOTE — Telephone Encounter (Signed)
-----   Message from Asa Lenteichard A Sater, MD sent at 06/28/2017 10:18 AM EST ----- Please let her know that the hepatitis labs were fine. The TB test came back as "indeterminate". We need to check an x-ray to make sure that there is no evidence of TB (in the past with other patients, when this has occurred everything has always turned out well).  I think that she lives in [place] so she may prefer to do this at [facility] or methadone or we can put in the order for [facility] imaging

## 2017-06-28 NOTE — Telephone Encounter (Signed)
-----   Message from Richard A Sater, MD sent at 06/28/2017 10:18 AM EST ----- Please let her know that the hepatitis labs were fine. The TB test came back as "indeterminate". We need to check an x-ray to make sure that there is no evidence of TB (in the past with other patients, when this has occurred everything has always turned out well).  I think that she lives in [place] so she may prefer to do this at [place] or methadone or we can put in the order for [place] imaging 

## 2017-06-28 NOTE — Telephone Encounter (Signed)
-----   Message from Richard A Sater, MD sent at 06/28/2017 10:18 AM EST ----- Please let her know that the hepatitis labs were fine. The TB test came back as "indeterminate". We need to check an x-ray to make sure that there is no evidence of TB (in the past with other patients, when this has occurred everything has always turned out well).  I think that she lives in [place] so she may prefer to do this at [place] or methadone or we can put in the order for [place] imaging 

## 2017-07-02 ENCOUNTER — Ambulatory Visit
Admission: RE | Admit: 2017-07-02 | Discharge: 2017-07-02 | Disposition: A | Discharge status: Home / Self Care | Payer: 59 | Source: Ambulatory Visit | Attending: Neurology | Admitting: Neurology

## 2017-07-02 DIAGNOSIS — R7612 Nonspecific reaction to cell mediated immunity measurement of gamma interferon antigen response without active tuberculosis: Secondary | ICD-10-CM | POA: Diagnosis present

## 2017-07-02 DIAGNOSIS — Z79899 Other long term (current) drug therapy: Secondary | ICD-10-CM | POA: Diagnosis not present

## 2017-07-02 DIAGNOSIS — R918 Other nonspecific abnormal finding of lung field: Secondary | ICD-10-CM | POA: Insufficient documentation

## 2017-07-02 DIAGNOSIS — G35 Multiple sclerosis: Secondary | ICD-10-CM | POA: Diagnosis not present

## 2017-07-05 ENCOUNTER — Telehealth: Payer: Self-pay | Admitting: *Deleted

## 2017-07-05 ENCOUNTER — Ambulatory Visit (INDEPENDENT_AMBULATORY_CARE_PROVIDER_SITE_OTHER): Payer: 59 | Admitting: Nurse Practitioner

## 2017-07-05 ENCOUNTER — Encounter: Payer: Self-pay | Admitting: Nurse Practitioner

## 2017-07-05 VITALS — BP 129/78 | HR 59 | Temp 98.0°F | Resp 16 | Ht 64.0 in | Wt 279.4 lb

## 2017-07-05 DIAGNOSIS — J069 Acute upper respiratory infection, unspecified: Secondary | ICD-10-CM | POA: Diagnosis not present

## 2017-07-05 DIAGNOSIS — R918 Other nonspecific abnormal finding of lung field: Principal | ICD-10-CM

## 2017-07-05 DIAGNOSIS — G35 Multiple sclerosis: Secondary | ICD-10-CM

## 2017-07-05 DIAGNOSIS — A150 Tuberculosis of lung: Secondary | ICD-10-CM

## 2017-07-05 DIAGNOSIS — Z79899 Other long term (current) drug therapy: Secondary | ICD-10-CM

## 2017-07-05 MED ORDER — AMOXICILLIN 500 MG PO TABS: 500.0000 mg | ORAL_TABLET | Freq: Two times a day (BID) | ORAL | 0 refills | Status: AC

## 2017-07-05 NOTE — Progress Notes (Signed)
Subjective:    Patient ID: Jody Lee, female    DOB: November 15, 1970, 46 y.o.   MRN: [id number]  Jody Lee is a 46 y.o. female presenting on 07/05/2017 for URI (coughing, nasal drip, and productive coughing x 5 days )   HPI URI symptoms Stuffy nose on Wed.    Cough, nasal congestion, rhinorrhea, and sinus congestion began to worsen on Friday.  Since Friday symptoms have been persistent.  -Pt has had no fever, but has had sweats a couple times at night. -Cough not keeping awake at night.  Uses cough drops and cold and flu medication and both are helping with symptoms. - Pt is concerned for possible infection w/ depressed immune system w/ autoimmune disease treatments. - Sick Contact: daughter w/ similar symptoms   - Important interval medical history: Pt was started on steroids last Monday r/t MS breakthrough numbness and tingling w/ prior med.  Pt also had a CXR on Friday to rule out Tb.   Social History   Tobacco Use  . Smoking status: Never Smoker  . Smokeless tobacco: Never Used  Substance Use Topics  . Alcohol use: No  . Drug use: No    Review of Systems Per HPI unless specifically indicated above     Objective:    BP 129/78 (BP Location: Right Arm, Patient Position: Sitting, Cuff Size: Large)   Pulse (!) 59   Temp 98 F (36.7 C) (Oral)   Resp 16   Ht 5\' 4"  (1.626 m)   Wt 279 lb 6.4 oz (126.7 kg)   SpO2 99%   BMI 47.96 kg/m   Wt Readings from Last 3 Encounters:  07/05/17 279 lb 6.4 oz (126.7 kg)  06/24/17 280 lb 8 oz (127.2 kg)  06/01/17 281 lb 3.2 oz (127.6 kg)    Physical Exam  Constitutional: She is oriented to person, place, and time. She appears well-developed and well-nourished. No distress.  HENT:  Head: Normocephalic and atraumatic.  Right Ear: Hearing, tympanic membrane, external ear and ear canal normal.  Left Ear: Hearing, tympanic membrane, external ear and ear canal normal.  Nose: Mucosal edema and rhinorrhea present. Right sinus  exhibits maxillary sinus tenderness. Right sinus exhibits no frontal sinus tenderness. Left sinus exhibits maxillary sinus tenderness. Left sinus exhibits no frontal sinus tenderness.  Mouth/Throat: Uvula is midline and mucous membranes are normal. Posterior oropharyngeal edema (cobblestoning noted) present.  Eyes: Conjunctivae are normal. Pupils are equal, round, and reactive to light.  Neck: Normal range of motion. Neck supple. No JVD present. No tracheal deviation present. No thyromegaly present.  Cardiovascular: Normal rate, regular rhythm, normal heart sounds and intact distal pulses.  Pulmonary/Chest: Effort normal and breath sounds normal. No respiratory distress. She has no wheezes.  Lymphadenopathy:    She has cervical adenopathy.  Neurological: She is alert and oriented to person, place, and time.  Skin: Skin is warm and dry.  Psychiatric: She has a normal mood and affect. Her behavior is normal. Judgment and thought content normal.  Vitals reviewed.    Results for orders placed or performed in visit on 06/24/17  Hepatitis B surface antigen  Result Value Ref Range   Hepatitis B Surface Ag Negative Negative  Hepatitis B core antibody, total  Result Value Ref Range   Hep B Core Total Ab Negative Negative  QuantiFERON-TB Gold Plus  Result Value Ref Range   QuantiFERON Incubation Incubation performed.    QuantiFERON Criteria Comment    QuantiFERON TB1 Ag Value  0.04 IU/mL   QuantiFERON TB2 Ag Value 0.03 IU/mL   QuantiFERON Nil Value 0.04 IU/mL   QuantiFERON Mitogen Value 0.39 IU/mL   QuantiFERON-TB Gold Plus Indeterminate (A) Negative  Comprehensive metabolic panel  Result Value Ref Range   Glucose 88 65 - 99 mg/dL   BUN 12 6 - 24 mg/dL   Creatinine, Ser 0.981.08 (H) 0.57 - 1.00 mg/dL   GFR calc non Af Amer 62 >59 mL/min/1.73   GFR calc Af Amer 72 >59 mL/min/1.73   BUN/Creatinine Ratio 11 9 - 23   Sodium 144 134 - 144 mmol/L   Potassium 4.5 3.5 - 5.2 mmol/L   Chloride 106 96  - 106 mmol/L   CO2 22 20 - 29 mmol/L   Calcium 9.2 8.7 - 10.2 mg/dL   Total Protein 6.5 6.0 - 8.5 g/dL   Albumin 4.3 3.5 - 5.5 g/dL   Globulin, Total 2.2 1.5 - 4.5 g/dL   Albumin/Globulin Ratio 2.0 1.2 - 2.2   Bilirubin Total 0.2 0.0 - 1.2 mg/dL   Alkaline Phosphatase 114 39 - 117 IU/L   AST 18 0 - 40 IU/L   ALT 19 0 - 32 IU/L  Hepatitis B surface antibody  Result Value Ref Range   Hep B Surface Ab, Qual Non Reactive       Assessment & Plan:   Problem List Items Addressed This Visit    None    Visit Diagnoses    Viral upper respiratory tract infection    -  Primary Acute illness. Fever responsive to NSAIDs and tylenol.  Symptoms not worsening. Consistent with viral illness x 5 days with known sick contacts and no identifiable focal infections of ears, nose, throat.  Plan: 1. Reassurance, likely self-limited with cough lasting up to few weeks - Start anti-histamine Loratadine 10mg  daily,  - also can use Flonase 2 sprays each nostril daily for up to 4-6 weeks - Start Mucinex-DM OTC up to 7-10 days then stop - If symptoms persist for total 7 days, START amoxicillin 500 mg bid x 10 days.  Lower threshold for treating illnesses since immunosuppressed, but also described need for antibiotic stewardship w/ this as well.  Pt verbalized understanding. 2. Supportive care with nasal saline, warm herbal tea with honey, 3. Improve hydration 4. Tylenol / Motrin PRN fevers 5. Return criteria given   Relevant Medications   amoxicillin (AMOXIL) 500 MG tablet (Start on 07/06/2017)      Meds ordered this encounter  Medications  . amoxicillin (AMOXIL) 500 MG tablet    Sig: Take 1 tablet (500 mg total) by mouth 2 (two) times daily for 10 days.    Dispense:  20 tablet    Refill:  0    Order Specific Question:   Supervising Provider    Answer:   Smitty CordsKARAMALEGOS, ALEXANDER J [[id number]]     Follow up plan: Return 5-7 days if symptoms worsen or fail to improve.   Wilhelmina McardleLauren Josia Cueva, DNP,  AGPCNP-BC Adult Gerontology Primary Care Nurse Practitioner [facility] [place] [facility] 07/05/2017, 10:26 AM

## 2017-07-05 NOTE — Telephone Encounter (Signed)
LMTC./fim 

## 2017-07-05 NOTE — Telephone Encounter (Signed)
-----   Message from Asa Lenteichard A Sater, MD sent at 07/05/2017  1:03 PM EST ----- Please let her know that the chest x-ray did show one small calcified nodule. Although this is nonspecific, I would need her to see pulmonology for an opinion on this before we can give ocrelizumab.

## 2017-07-05 NOTE — Patient Instructions (Addendum)
Jody Lee, Thank you for coming in to clinic today.  1. It sounds like you have a Upper Respiratory Virus - this will most likely run it's course in 7 to 10 days.  If not feeling better by day 7 (Wednesday), START amoxicillin 500 mg twice daily for 10 days. Recommend good hand washing. - Start anti-histamine cetirizine 10mg  daily, also can use Flonase 2 sprays each nostril daily for up to 4-6 weeks - If congestion is worse, start OTC Mucinex (or may try Mucinex-DM for cough) up to 7-10 days then stop - Drink plenty of fluids to improve congestion - You may try over the counter Nasal Saline spray (Simply Saline, Ocean Spray) as needed to reduce congestion. - Drink warm herbal tea with honey for sore throat. - Start taking Tylenol extra strength 1 to 2 tablets every 6-8 hours for aches or fever/chills for next few days as needed.  Do not take more than 3,000 mg in 24 hours from all medicines.  May take Ibuprofen as well if tolerated 200-400mg  every 8 hours as needed.  If symptoms significantly worsening with persistent fevers/chills despite tylenol/ibpurofen, nausea, vomiting unable to tolerate food/fluids or medicine, body aches, or shortness of breath, sinus pain pressure or worsening productive cough, then follow-up for re-evaluation, may seek more immediate care at Urgent Care or ED if more concerned for emergency.   Please schedule a follow-up appointment with [name], AGNP. Return 5-7 days if symptoms worsen or fail to improve.  If you have any other questions or concerns, please feel free to call the clinic or send a message through MyChart. You may also schedule an earlier appointment if necessary.  You will receive a survey after today's visit either digitally by e-mail or paper by [employer]. Your experiences and feedback matter to [place].  Please respond so we know how we are doing as we provide care for you.   [name], DNP, AGNP-BC Adult Gerontology Nurse Practitioner [facility], [facility]

## 2017-07-06 ENCOUNTER — Telehealth: Payer: Self-pay | Admitting: *Deleted

## 2017-07-06 NOTE — Addendum Note (Signed)
Addended by: Candis SchatzMISENHEIMER, Rosalene Wardrop I on: 07/06/2017 10:19 AM   Modules accepted: Orders

## 2017-07-06 NOTE — Telephone Encounter (Signed)
Pt has returned the call to RN Faith for results.  Pt is asking for a call back

## 2017-07-06 NOTE — Telephone Encounter (Signed)
Spoke with Jody Lee this morning and reviewed CXR results with her. She verbalized understanding of same, is agreeable to referral to Pulmonology. Order for referral placed in Epic/fim

## 2017-07-06 NOTE — Telephone Encounter (Signed)
-----   Message from Richard A Sater, MD sent at 07/05/2017  1:03 PM EST ----- Please let her know that the chest x-ray did show one small calcified nodule. Although this is nonspecific, I would need her to see pulmonology for an opinion on this before we can give ocrelizumab. 

## 2017-07-06 NOTE — Telephone Encounter (Signed)
See phone note/fim 

## 2017-07-07 ENCOUNTER — Institutional Professional Consult (permissible substitution): Payer: 59 | Admitting: Internal Medicine

## 2017-07-07 NOTE — Progress Notes (Signed)
[facility] Consultation     Synopsis This is a 46 year old female with history of gastric bypass with chronic iron deficiency anemia, history of MS on immunosuppressive therapies.  Now presents with an indeterminate TB blood test.  Assessment and Plan:  Indeterminant TB QuantiFERON. -Blood test reviewed, these results appear indeterminate due to anergy from chronic and ongoing immunosuppression. -The patient presents no significant risk factors for TB exposure or current TB infection. - Okay to proceed/continue with immunosuppressive therapies for treatment of her multiple sclerosis.  Lung nodule. - Patient has minimal risk for cancer, however small nodule was seen in the right lower lobe on chest x-ray. -Perform CT chest without contrast to better evaluate this nodule, and plan for follow-up as necessary.   Date: 07/08/2017  MRN# [id number] KYNDELL PARAISO 25-Jul-1971    CARMYNN BUIE is a 46 y.o. old female seen in consultation for chief complaint of:    Chief Complaint  Patient presents with  . Advice Only    Referred by Dr. Epimenio Foot for evaluation of possible TB exposure/new MS medication    HPI:   She was diagnosed with 2005 with MS and has been on interferon. Recently she had new lesions in her neuro MRI so her regimen is being changed. She had the TB blood test performed which was inconclusive, so she did a chest xray which showed a lung nodule.  She has never travelled outside the [place], has never been exposed to Tb as far as she knows.   She has been having a cough, denies hemoptysis, or weight gain. She denies regular night sweats, denies fevers. She denies any respiratory symptoms.  She has never smoked, but her father and her husband smoke.   Images personally reviewed; CXR 07/02/17; Lung are unremarkable, RLL nodule.    PMHX:   Past Medical History:  Diagnosis Date  . Anemia   . Hypertension   . Menorrhagia   . MS (multiple sclerosis) (HCC)     . Shingles    right side of chest and back  . Vision abnormalities   . Vitamin B 12 deficiency   . Vitamin D deficiency    Surgical Hx:  Past Surgical History:  Procedure Laterality Date  . CHOLECYSTECTOMY    . GASTRIC BYPASS     Family Hx:  Family History  Problem Relation Age of Onset  . Stroke Mother   . Heart disease Mother   . Asthma Mother   . Diabetes Mother   . Hypertension Father   . Heart disease Father   . Diabetes Father   . Asthma Brother   . Alcoholism Brother   . Obesity Brother   . Heart disease Brother   . Diabetes Maternal Grandmother   . Lung cancer Maternal Grandfather   . Breast cancer Neg Hx    Social Hx:   Social History   Tobacco Use  . Smoking status: Never Smoker  . Smokeless tobacco: Never Used  Substance Use Topics  . Alcohol use: No  . Drug use: No   Medication:    Current Outpatient Medications:  .  acetaminophen (TYLENOL) 325 MG tablet, Take 650 mg by mouth every 4 (four) hours as needed., Disp: , Rfl:  .  amoxicillin (AMOXIL) 500 MG tablet, Take 1 tablet (500 mg total) by mouth 2 (two) times daily for 10 days., Disp: 20 tablet, Rfl: 0 .  baclofen (LIORESAL) 10 MG tablet, Take 1 tablet (10 mg total) by mouth 3 (three)  times daily., Disp: 30 each, Rfl: 0 .  Biotin 10 MG CAPS, Take 1 capsule by mouth daily., Disp: , Rfl:  .  FeFum-FePo-FA-B Cmp-C-Zn-Mn-Cu (TANDEM PLUS) 162-115.2-1 MG CAPS, Take 1 tablet by mouth daily., Disp: 90 each, Rfl: 4 .  folic acid (FOLVITE) 1 MG tablet, Take 2 mg by mouth daily., Disp: , Rfl:  .  lisinopril (PRINIVIL,ZESTRIL) 40 MG tablet, TAKE 1 TABLET DAILY, Disp: 90 tablet, Rfl: 0 .  loratadine (CLARITIN) 10 MG tablet, Take 10 mg by mouth daily., Disp: , Rfl:  .  MedroxyPROGESTERone Acetate 150 MG/ML SUSY, INJECT 1 ML (150 MG) INTRAMUSCULARLY EVERY 3 MONTHS, Disp: 1 mL, Rfl: 3 .  modafinil (PROVIGIL) 200 MG tablet, TAKE 1 TABLET BY MOUTH DAILY., Disp: 30 tablet, Rfl: 5 .  omeprazole (PRILOSEC) 20 MG  capsule, Take 20 mg by mouth 2 (two) times daily as needed., Disp: , Rfl:  .  hydrochlorothiazide (MICROZIDE) 12.5 MG capsule, Take 12.5 mg by mouth daily. , Disp: , Rfl:  .  methylPREDNISolone (MEDROL) 4 MG tablet, Take as idrected (Patient not taking: Reported on 07/08/2017), Disp: 21 tablet, Rfl: 0 No current facility-administered medications for this visit.   Facility-Administered Medications Ordered in Other Visits:  .  gadopentetate dimeglumine (MAGNEVIST) injection 20 mL, 20 mL, Intravenous, Once PRN, Sater, Pearletha Furl, MD   Allergies:  Nsaids and Ciprofloxacin  Review of Systems: Gen:  Denies  fever, sweats, chills HEENT: Denies blurred vision, double vision. bleeds, sore throat Cvc:  No dizziness, chest pain. Resp:   Denies cough or sputum production, shortness of breath Gi: Denies swallowing difficulty, stomach pain. Gu:  Denies bladder incontinence, burning urine Ext:   No Joint pain, stiffness. Skin: No skin rash,  hives  Endoc:  No polyuria, polydipsia. Psych: No depression, insomnia. Other:  All other systems were reviewed with the patient and were negative other that what is mentioned in the HPI.   Physical Examination:   VS: BP 126/84 (BP Location: Left Arm, Cuff Size: Large)   Pulse 74   Resp 16   Ht 5\' 4"  (1.626 m)   Wt 277 lb (125.6 kg)   SpO2 98%   BMI 47.55 kg/m   General Appearance: No distress  Neuro:without focal findings,  speech normal,  HEENT: PERRLA, EOM intact.   Pulmonary: normal breath sounds, No wheezing.  CardiovascularNormal S1,S2.  No m/r/g.   Abdomen: Benign, Soft, non-tender. Renal:  No costovertebral tenderness  GU:  No performed at this time. Endoc: No evident thyromegaly, no signs of acromegaly. Skin:   warm, no rashes, no ecchymosis  Extremities: normal, no cyanosis, clubbing.  Other findings:    LABORATORY PANEL:   CBC No results for input(s): WBC, HGB, HCT, PLT in the last 168  hours. ------------------------------------------------------------------------------------------------------------------  Chemistries  No results for input(s): NA, K, CL, CO2, GLUCOSE, BUN, CREATININE, CALCIUM, MG, AST, ALT, ALKPHOS, BILITOT in the last 168 hours.  Invalid input(s): GFRCGP ------------------------------------------------------------------------------------------------------------------  Cardiac Enzymes No results for input(s): TROPONINI in the last 168 hours. ------------------------------------------------------------  RADIOLOGY:  No results found.     Thank  you for the consultation and for allowing [facility] to assist in the care of your patient. Our recommendations are noted above.  Please contact us if we can be of further service.   Wells Guiles, MD.  Board Certified in Internal Medicine, Pulmonary Medicine, Critical Care Medicine, and Sleep Medicine.  [place] [facility] Office Number: [phone]  M.D.  Billy Fischeravid Simonds, M.D  07/08/2017

## 2017-07-08 ENCOUNTER — Ambulatory Visit (INDEPENDENT_AMBULATORY_CARE_PROVIDER_SITE_OTHER): Payer: 59 | Admitting: Internal Medicine

## 2017-07-08 ENCOUNTER — Encounter: Payer: Self-pay | Admitting: Internal Medicine

## 2017-07-08 VITALS — BP 126/84 | HR 74 | Resp 16 | Ht 64.0 in | Wt 277.0 lb

## 2017-07-08 DIAGNOSIS — R911 Solitary pulmonary nodule: Secondary | ICD-10-CM

## 2017-07-08 DIAGNOSIS — R918 Other nonspecific abnormal finding of lung field: Secondary | ICD-10-CM | POA: Diagnosis not present

## 2017-07-08 NOTE — Patient Instructions (Addendum)
--  You may proceed with your therapy for MS, you are cleared for TB.  --Will send you for a CT chest, I will call you with results and set up follow up based on the results.

## 2017-07-09 NOTE — Progress Notes (Signed)
Please let her know that I read the pulmonology evaluation. They feel that the nodule is probably nothing to worry about and that we can proceed with Ocrelizumab.  Okay to  turn in the form.

## 2017-07-12 ENCOUNTER — Inpatient Hospital Stay (HOSPITAL_BASED_OUTPATIENT_CLINIC_OR_DEPARTMENT_OTHER): Payer: 59 | Admitting: Hematology and Oncology

## 2017-07-12 ENCOUNTER — Inpatient Hospital Stay: Payer: 59

## 2017-07-12 ENCOUNTER — Other Ambulatory Visit: Payer: Self-pay | Admitting: Hematology and Oncology

## 2017-07-12 ENCOUNTER — Inpatient Hospital Stay: Payer: 59 | Attending: Hematology and Oncology

## 2017-07-12 VITALS — BP 137/90 | HR 69 | Temp 97.6°F | Resp 18 | Wt 278.0 lb

## 2017-07-12 DIAGNOSIS — R5383 Other fatigue: Secondary | ICD-10-CM

## 2017-07-12 DIAGNOSIS — Z8619 Personal history of other infectious and parasitic diseases: Secondary | ICD-10-CM | POA: Diagnosis not present

## 2017-07-12 DIAGNOSIS — E538 Deficiency of other specified B group vitamins: Secondary | ICD-10-CM | POA: Diagnosis not present

## 2017-07-12 DIAGNOSIS — D508 Other iron deficiency anemias: Secondary | ICD-10-CM

## 2017-07-12 DIAGNOSIS — N920 Excessive and frequent menstruation with regular cycle: Secondary | ICD-10-CM | POA: Insufficient documentation

## 2017-07-12 DIAGNOSIS — R911 Solitary pulmonary nodule: Secondary | ICD-10-CM | POA: Diagnosis not present

## 2017-07-12 DIAGNOSIS — D509 Iron deficiency anemia, unspecified: Secondary | ICD-10-CM | POA: Insufficient documentation

## 2017-07-12 DIAGNOSIS — E559 Vitamin D deficiency, unspecified: Secondary | ICD-10-CM | POA: Diagnosis not present

## 2017-07-12 DIAGNOSIS — G35 Multiple sclerosis: Secondary | ICD-10-CM

## 2017-07-12 DIAGNOSIS — G629 Polyneuropathy, unspecified: Secondary | ICD-10-CM

## 2017-07-12 DIAGNOSIS — I10 Essential (primary) hypertension: Secondary | ICD-10-CM | POA: Diagnosis not present

## 2017-07-12 DIAGNOSIS — Z9884 Bariatric surgery status: Secondary | ICD-10-CM | POA: Diagnosis not present

## 2017-07-12 DIAGNOSIS — Z801 Family history of malignant neoplasm of trachea, bronchus and lung: Secondary | ICD-10-CM

## 2017-07-12 DIAGNOSIS — Z79899 Other long term (current) drug therapy: Secondary | ICD-10-CM | POA: Insufficient documentation

## 2017-07-12 LAB — CBC WITH DIFFERENTIAL/PLATELET
Basophils Absolute: 0 10*3/uL (ref 0–0.1)
Basophils Relative: 0 %
Eosinophils Absolute: 0 10*3/uL (ref 0–0.7)
Eosinophils Relative: 0 %
HCT: 39.7 % (ref 35.0–47.0)
Hemoglobin: 13.1 g/dL (ref 12.0–16.0)
Lymphocytes Relative: 10 %
Lymphs Abs: 0.7 10*3/uL — ABNORMAL LOW (ref 1.0–3.6)
MCH: 29.6 pg (ref 26.0–34.0)
MCHC: 33.1 g/dL (ref 32.0–36.0)
MCV: 89.6 fL (ref 80.0–100.0)
Monocytes Absolute: 0.6 10*3/uL (ref 0.2–0.9)
Monocytes Relative: 9 %
Neutro Abs: 5.2 10*3/uL (ref 1.4–6.5)
Neutrophils Relative %: 81 %
Platelets: 267 10*3/uL (ref 150–440)
RBC: 4.43 MIL/uL (ref 3.80–5.20)
RDW: 14.2 % (ref 11.5–14.5)
WBC: 6.5 10*3/uL (ref 3.6–11.0)

## 2017-07-12 LAB — FOLATE: Folate: 47 ng/mL (ref >5.9)

## 2017-07-12 LAB — IRON AND TIBC
Iron: 63 ug/dL (ref 28–170)
Saturation Ratios: 22 % (ref 10.4–31.8)
TIBC: 282 ug/dL (ref 250–450)
UIBC: 219 ug/dL

## 2017-07-12 LAB — FERRITIN: Ferritin: 103 ng/mL (ref 11–307)

## 2017-07-12 NOTE — Progress Notes (Signed)
Pt in today for follow up reports being very fatigued and tired all the time.  Pt also states that 2 new lesions were recently found related to MS. Meds recently discontinued for MS and in 2-3 weeks pt will start Ocrelizumab.  Pt reports that a nodule was found on right lung, CT scan scheduled for tomorrow.

## 2017-07-12 NOTE — Progress Notes (Signed)
[facility]-  Cancer Center  Clinic day:  07/12/17   Chief Complaint: Jody Lee is a 46 y.o. female status post gastric bypass surgery and subsequent iron deficiency and B12 deficiency who is seen for 6 month assessment.  HPI: The patient was last seen in the medical oncology clinic on 01/08/2017.  At that time, she was fatigued.  Exam revealed no palpable adenopathy or hepatosplenomegaly.  Hematocrit was 36.7.  Ferritin was 50.  She did not receive Venofer.  She received B12.  She received B12 monthly (01/08/2017 - 06/25/2017).    Symptomatically, patient notes that she is tired. Patient recently had a MRI of her brain that revealed 2 new spots on her brain (has MS diagnosis). MS treatment regimen is being changed from Gilenya to Ocrevus. She is currently on a 60 day wash out prior to beginning Ocrevus. Patient had recently inconclusive TB testing. She was sent for a CXR that revealed a pulmonary nodule. Patient is scheduled for a CT scan of her chest on 07/13/2017.  Patient complains of neuropathy in her hands related to her underlying MS diagnosis. Patient is eating well. Her diet is rich in iron. She is trying to eat more red meat. Patient is not losing weight.    Past Medical History:  Diagnosis Date  . Anemia   . Hypertension   . Menorrhagia   . MS (multiple sclerosis) (HCC)   . Shingles    right side of chest and back  . Vision abnormalities   . Vitamin B 12 deficiency   . Vitamin D deficiency     Past Surgical History:  Procedure Laterality Date  . CHOLECYSTECTOMY    . GASTRIC BYPASS      Family History  Problem Relation Age of Onset  . Stroke Mother   . Heart disease Mother   . Asthma Mother   . Diabetes Mother   . Hypertension Father   . Heart disease Father   . Diabetes Father   . Asthma Brother   . Alcoholism Brother   . Obesity Brother   . Heart disease Brother   . Diabetes Maternal Grandmother   . Lung cancer Maternal Grandfather    . Breast cancer Neg Hx     Social History:  reports that  has never smoked. she has never used smokeless tobacco. She reports that she does not drink alcohol or use drugs.  She recently moved from an apartment into a house.  She got married in 11/2016.  The patient is alone today.  Allergies:  Allergies  Allergen Reactions  . Nsaids Other (See Comments)    Not supposed to take d/t bariatric surgery   . Ciprofloxacin Rash    Current Medications: Current Outpatient Medications  Medication Sig Dispense Refill  . acetaminophen (TYLENOL) 325 MG tablet Take 650 mg by mouth every 4 (four) hours as needed.    Marland Kitchen. amoxicillin (AMOXIL) 500 MG tablet Take 1 tablet (500 mg total) by mouth 2 (two) times daily for 10 days. 20 tablet 0  . baclofen (LIORESAL) 10 MG tablet Take 1 tablet (10 mg total) by mouth 3 (three) times daily. 30 each 0  . Biotin 10 MG CAPS Take 1 capsule by mouth daily.    Marland Kitchen. FeFum-FePo-FA-B Cmp-C-Zn-Mn-Cu (TANDEM PLUS) 162-115.2-1 MG CAPS Take 1 tablet by mouth daily. 90 each 4  . folic acid (FOLVITE) 1 MG tablet Take 2 mg by mouth daily.    Marland Kitchen. lisinopril (PRINIVIL,ZESTRIL) 40 MG tablet TAKE  1 TABLET DAILY 90 tablet 0  . loratadine (CLARITIN) 10 MG tablet Take 10 mg by mouth daily.    . MedroxyPROGESTERone Acetate 150 MG/ML SUSY INJECT 1 ML (150 MG) INTRAMUSCULARLY EVERY 3 MONTHS 1 mL 3  . modafinil (PROVIGIL) 200 MG tablet TAKE 1 TABLET BY MOUTH DAILY. 30 tablet 5  . omeprazole (PRILOSEC) 20 MG capsule Take 20 mg by mouth 2 (two) times daily as needed.    . hydrochlorothiazide (MICROZIDE) 12.5 MG capsule Take 12.5 mg by mouth daily.     Marland Kitchen ocrelizumab 600 mg in sodium chloride 0.9 % 500 mL Inject 600 mg into the vein once.     No current facility-administered medications for this visit.    Facility-Administered Medications Ordered in Other Visits  Medication Dose Route Frequency Provider Last Rate Last Dose  . gadopentetate dimeglumine (MAGNEVIST) injection 20 mL  20 mL  Intravenous Once PRN Sater, Pearletha Furl, MD        Review of Systems:  GENERAL:  Feels "tired".  No fevers.  Some night sweats.  Weight gain of 1 pound. PERFORMANCE STATUS (ECOG):  1 HEENT:  No visual changes, runny nose, sore throat, mouth sores or tenderness. Lungs: No shortness of breath or cough.  No hemoptysis. Cardiac:  No chest pain, palpitations, orthopnea, or PND. GI:  No nausea, vomiting, diarrhea, constipation, melena or hematochezia. GU:  No urgency, frequency, dysuria, or hematuria. Musculoskeletal:  No pain or muscle tenderness. Extremities:  No pain or swelling. Skin:  No rashes or skin changes.  Interval shingles. Neuro:  Peripheral neuropathy due to MS.  No headache, numbness or weakness, balance or coordination issues. Endocrine:  No diabetes, thyroid issues.  Hot flashes and  night sweats. Psych:  No mood changes, depression or anxiety. Pain:  No focal pain. Review of systems:  All other systems reviewed and found to be negative.   Physical Exam: Blood pressure 137/90, pulse 69, temperature 97.6 F (36.4 C), temperature source Tympanic, resp. rate 18, weight 278 lb (126.1 kg), SpO2 100 %. GENERAL:  Well developed, well nourished, heavyset woman sitting comfortably in the exam room in no acute distress. MENTAL STATUS:  Alert and oriented to person, place and time. HEAD:  Long brown hair.  Normocephalic, atraumatic, face symmetric, no Cushingoid features. EYES:  Glasses.  Blue eyes.  Pupils equal round and reactive to light and accomodation.  No conjunctivitis or scleral icterus. ENT:  Oropharynx clear without lesion.  Dentures.  Tongue normal. Mucous membranes moist.  RESPIRATORY:  Clear to auscultation without rales, wheezes or rhonchi. CARDIOVASCULAR:  Regular rate and rhythm without murmur, rub or gallop. ABDOMEN:  Fully round.  Soft, non-tender, with active bowel sounds, and no appreciable hepatosplenomegaly.  No masses. SKIN:  Abdominal striae.  No rashes, ulcers  or lesions. EXTREMITIES: No edema, no skin discoloration or tenderness.  No palpable cords. LYMPH NODES: No palpable cervical, supraclavicular, axillary or inguinal adenopathy  NEUROLOGICAL: Unremarkable. PSYCH:  Appropriate.   Appointment on 07/12/2017  Component Date Value Ref Range Status  . WBC 07/12/2017 6.5  3.6 - 11.0 K/uL Final  . RBC 07/12/2017 4.43  3.80 - 5.20 MIL/uL Final  . Hemoglobin 07/12/2017 13.1  12.0 - 16.0 g/dL Final  . HCT 73/57/8978 39.7  35.0 - 47.0 % Final  . MCV 07/12/2017 89.6  80.0 - 100.0 fL Final  . MCH 07/12/2017 29.6  26.0 - 34.0 pg Final  . MCHC 07/12/2017 33.1  32.0 - 36.0 g/dL Final  . RDW 47/84/1282  14.2  11.5 - 14.5 % Final  . Platelets 07/12/2017 267  150 - 440 K/uL Final  . Neutrophils Relative % 07/12/2017 81  % Final  . Neutro Abs 07/12/2017 5.2  1.4 - 6.5 K/uL Final  . Lymphocytes Relative 07/12/2017 10  % Final  . Lymphs Abs 07/12/2017 0.7* 1.0 - 3.6 K/uL Final  . Monocytes Relative 07/12/2017 9  % Final  . Monocytes Absolute 07/12/2017 0.6  0.2 - 0.9 K/uL Final  . Eosinophils Relative 07/12/2017 0  % Final  . Eosinophils Absolute 07/12/2017 0.0  0 - 0.7 K/uL Final  . Basophils Relative 07/12/2017 0  % Final  . Basophils Absolute 07/12/2017 0.0  0 - 0.1 K/uL Final    Assessment:  TRAMAINE SNELL is a 46 y.o. female  status post gastric bypass surgery (2003) with resultant iron deficiency anemia and B12 deficiency.  She is intolerant of oral iron.   She has iron deficiency.  She receives Venofer when her ferritin is < 30 (last 08/21/2016).  Ferritin has been followed:  62 on 01/04/2015, 73 on 02/22/2015, 40 on 03/25/2015, 10 on 05/28/2015, 61 on 08/21/2015, 20 on 11/12/2015, 56 on 02/12/2016, 33 on 03/27/2016, 79 on 05/20/2016, 45 on 06/26/2016, 28 on 08/20/2016, 50 on 01/08/2017, and 103 on 07/12/2017.  She has B12 deficiency.  She was initially treated with B12 IM, but has subsequently been on oral B12.  B12 was sub-therapeutic on  06/27/2014.  She received weekly B12 x 6 (01/04/2015-02/22/2015) followed by monthly B12 (last 06/25/2017).  Folic acid was normal (12.5) on 01/25/2015.  She takes daily folic acid.    She has multiple sclerosis on Gilenya (fingolimod). There is a 4-7% risk of lymphopenia.  There is a < 1%, post marketing/case reports of lymphoma.  She had shingles in 11/2016.  Symptomatically, she is fatigued.  Exam reveals no palpable adenopathy or hepatosplenomegaly.  Hematocrit is 39.7 and hemoglobin 13.1.  Ferritin is 103.  Plan: 1.  Labs today:  CBC with diff, ferritin, iron studies, folate. 2.  Discuss plan for Venofer when ferritin < 30 secondary to patient's symptoms.  3.  RTC monthly for B12 injections x 6 (next due 07/23/2017). 4.  Continue to follow up with neurology for ongoing treatment of MS. 5.  RTC in 3 months for labs (CBC with diff, ferritin) 6.  RTC in 6 months for MD assessment, labs (CBC with diff, ferritin- day before) + B12   Quentin Mulling, NP  07/12/2017, 2:57 PM   I saw and evaluated the patient, participating in the key portions of the service and reviewing pertinent diagnostic studies and records.  I reviewed the nurse practitioner's note and agree with the findings and the plan.  The assessment and plan were discussed with the patient.  A few questions were asked by the patient and answered.   Nelva Nay, MD 07/12/2017,2:57 PM

## 2017-07-13 ENCOUNTER — Ambulatory Visit
Admission: RE | Admit: 2017-07-13 | Discharge: 2017-07-13 | Disposition: A | Discharge status: Home / Self Care | Payer: 59 | Source: Ambulatory Visit | Attending: Internal Medicine | Admitting: Internal Medicine

## 2017-07-13 ENCOUNTER — Telehealth: Payer: Self-pay | Admitting: *Deleted

## 2017-07-13 DIAGNOSIS — Z9049 Acquired absence of other specified parts of digestive tract: Secondary | ICD-10-CM | POA: Diagnosis not present

## 2017-07-13 DIAGNOSIS — Z9884 Bariatric surgery status: Secondary | ICD-10-CM | POA: Diagnosis not present

## 2017-07-13 DIAGNOSIS — R918 Other nonspecific abnormal finding of lung field: Secondary | ICD-10-CM | POA: Diagnosis not present

## 2017-07-13 NOTE — Telephone Encounter (Signed)
-----   Message from Rosey Bath, MD sent at 07/12/2017  5:17 PM EST ----- Regarding: Please call patient   Ferritin 103.  Venofer not needed.  M  ----- Message ----- From: Interface, Lab In [place] Sent: 07/12/2017   2:17 PM To: Rosey Bath, MD

## 2017-07-13 NOTE — Telephone Encounter (Signed)
Called patient to inform her that her ferritin is 103.  She does not need to come for venofer.

## 2017-07-21 ENCOUNTER — Encounter: Payer: Self-pay | Admitting: Neurology

## 2017-07-22 ENCOUNTER — Encounter: Payer: Self-pay | Admitting: Nurse Practitioner

## 2017-07-22 MED ORDER — AMOXICILLIN-POT CLAVULANATE 875-125 MG PO TABS: 1.0000 | ORAL_TABLET | Freq: Two times a day (BID) | ORAL | 0 refills | Status: AC

## 2017-07-23 ENCOUNTER — Inpatient Hospital Stay: Payer: 59

## 2017-07-25 ENCOUNTER — Encounter: Payer: Self-pay | Admitting: Hematology and Oncology

## 2017-07-27 ENCOUNTER — Other Ambulatory Visit: Payer: Self-pay | Admitting: Nurse Practitioner

## 2017-07-27 DIAGNOSIS — I10 Essential (primary) hypertension: Secondary | ICD-10-CM

## 2017-07-30 ENCOUNTER — Ambulatory Visit: Payer: 59

## 2017-08-05 ENCOUNTER — Encounter: Payer: Self-pay | Admitting: Neurology

## 2017-08-05 ENCOUNTER — Other Ambulatory Visit: Payer: Self-pay | Admitting: Neurology

## 2017-08-05 ENCOUNTER — Ambulatory Visit (INDEPENDENT_AMBULATORY_CARE_PROVIDER_SITE_OTHER): Payer: 59

## 2017-08-05 DIAGNOSIS — Z3042 Encounter for surveillance of injectable contraceptive: Secondary | ICD-10-CM | POA: Diagnosis not present

## 2017-08-05 MED ORDER — MEDROXYPROGESTERONE ACETATE 150 MG/ML IM SUSP
150.0000 mg | Freq: Once | INTRAMUSCULAR | Status: AC
  Administered 2017-08-05: 150 mg via INTRAMUSCULAR

## 2017-08-06 MED ORDER — BACLOFEN 10 MG PO TABS: 10.0000 mg | ORAL_TABLET | Freq: Three times a day (TID) | ORAL | 0 refills | Status: DC

## 2017-08-09 ENCOUNTER — Encounter: Payer: Self-pay | Admitting: Nurse Practitioner

## 2017-08-09 ENCOUNTER — Other Ambulatory Visit: Payer: Self-pay

## 2017-08-09 ENCOUNTER — Ambulatory Visit (INDEPENDENT_AMBULATORY_CARE_PROVIDER_SITE_OTHER): Payer: 59 | Admitting: Nurse Practitioner

## 2017-08-09 VITALS — BP 121/72 | HR 68 | Temp 98.1°F | Resp 18 | Ht 64.0 in | Wt 291.6 lb

## 2017-08-09 DIAGNOSIS — J189 Pneumonia, unspecified organism: Secondary | ICD-10-CM

## 2017-08-09 MED ORDER — CEFTRIAXONE SODIUM 1 G IJ SOLR
1.0000 g | Freq: Once | INTRAMUSCULAR | Status: AC
  Administered 2017-08-09: 1 g via INTRAMUSCULAR

## 2017-08-09 MED ORDER — AZITHROMYCIN 250 MG PO TABS: ORAL_TABLET | ORAL | 0 refills | Status: DC

## 2017-08-09 MED ORDER — BENZONATATE 100 MG PO CAPS: 100.0000 mg | ORAL_CAPSULE | Freq: Three times a day (TID) | ORAL | 0 refills | Status: DC | PRN

## 2017-08-09 MED ORDER — ALBUTEROL SULFATE HFA 108 (90 BASE) MCG/ACT IN AERS: 1.0000 | INHALATION_SPRAY | Freq: Four times a day (QID) | RESPIRATORY_TRACT | 5 refills | Status: DC | PRN

## 2017-08-09 NOTE — Patient Instructions (Addendum)
Jody Lee, Thank you for coming in to clinic today.  1. You have pneumonia. - START taking azithyromycin 250 mg tablet.  Take 2 tablets today.  Then, take 1 tablet daily for 4 more days. - START albuterol inhaler 1-2 puffs every 6 hours as needed for wheezing or shortness of breath. - START tessalon perles 100 mg three times daily as needed for cough.  Please schedule a follow-up appointment with [name], AGNP. Return 5 days if symptoms worsen or fail to improve.  If you have any other questions or concerns, please feel free to call the clinic or send a message through MyChart. You may also schedule an earlier appointment if necessary.  You will receive a survey after today's visit either digitally by e-mail or paper by [employer]. Your experiences and feedback matter to [place].  Please respond so we know how we are doing as we provide care for you.   [name], DNP, AGNP-BC Adult Gerontology Nurse Practitioner [facility], [facility]

## 2017-08-09 NOTE — Progress Notes (Signed)
Subjective:    Patient ID: Jody Lee, female    DOB: 01/09/1971, 46 y.o.   MRN: [id number]  Jody Lee is a 46 y.o. female presenting on 08/09/2017 for URI (chronic cough, productive cough, chest congestion, and  nasal congestion,. Pt notice some improvement after second rouind abx, but started returning x 3 days ago)  HPI Cough Patient returns to clinic after evaluation and treatment for upper respiratory infection with antibiotic ending 08/02/2017.  Patient initially took amoxicillin and had partial response, but return of symptoms within a few days after stopping.  Patient then took Augmentin with same result.  Patient had more significant return of symptoms on Friday, 08/06/2017.  Symptoms today include productive cough with green sputum, shortness of breath with exertion, mild chest tightness with inhalation after exertion.  Patient remains off immunosuppressants for MS, but plans to resume new immunosuppressant in January and wants to feel well without infection before resuming immunosuppressants. - Pt denies fever, chills, sweats, nausea, vomiting, diarrhea and constipation. - Sick contact: husband - now improving.  Social History   Tobacco Use  . Smoking status: Never Smoker  . Smokeless tobacco: Never Used  Substance Use Topics  . Alcohol use: No  . Drug use: No    Review of Systems Per HPI unless specifically indicated above     Objective:    BP 121/72 (BP Location: Right Arm, Patient Position: Sitting, Cuff Size: Large)   Pulse 68   Temp 98.1 F (36.7 C) (Oral)   Resp 18   Ht 5\' 4"  (1.626 m)   Wt 291 lb 9.6 oz (132.3 kg)   SpO2 100%   BMI 50.05 kg/m   Wt Readings from Last 3 Encounters:  08/09/17 291 lb 9.6 oz (132.3 kg)  07/12/17 278 lb (126.1 kg)  07/08/17 277 lb (125.6 kg)    Physical Exam  Constitutional: She is oriented to person, place, and time. She appears well-developed and well-nourished.  HENT:  Head: Normocephalic and atraumatic.  Right  Ear: Hearing, tympanic membrane, external ear and ear canal normal.  Left Ear: Hearing, tympanic membrane, external ear and ear canal normal.  Nose: Nose normal. No mucosal edema. Right sinus exhibits no maxillary sinus tenderness and no frontal sinus tenderness. Left sinus exhibits no maxillary sinus tenderness and no frontal sinus tenderness.  Mouth/Throat: Uvula is midline, oropharynx is clear and moist and mucous membranes are normal.  Eyes: Conjunctivae are normal. Pupils are equal, round, and reactive to light.  Neck: Normal range of motion. Neck supple.  Cardiovascular: Normal rate, regular rhythm, normal heart sounds and intact distal pulses.  Pulmonary/Chest: Effort normal. No accessory muscle usage. No respiratory distress. She has wheezes in the right upper field and the right middle field. She has rhonchi in the right upper field and the right middle field. She has rales in the right upper field, the right middle field, the right lower field, the left upper field, the left middle field and the left lower field.  Neurological: She is alert and oriented to person, place, and time.  Skin: Skin is warm and dry.  Psychiatric: She has a normal mood and affect. Her behavior is normal. Judgment and thought content normal.  Vitals reviewed.   Results for orders placed or performed in visit on 07/12/17  Folate  Result Value Ref Range   Folate 47.0 >5.9 ng/mL  Iron and TIBC  Result Value Ref Range   Iron 63 28 - 170 ug/dL   TIBC 119  250 - 450 ug/dL   Saturation Ratios 22 10.4 - 31.8 %   UIBC 219 ug/dL  Ferritin  Result Value Ref Range   Ferritin 103 11 - 307 ng/mL  CBC with Differential/Platelet  Result Value Ref Range   WBC 6.5 3.6 - 11.0 K/uL   RBC 4.43 3.80 - 5.20 MIL/uL   Hemoglobin 13.1 12.0 - 16.0 g/dL   HCT 67.8 93.8 - 10.1 %   MCV 89.6 80.0 - 100.0 fL   MCH 29.6 26.0 - 34.0 pg   MCHC 33.1 32.0 - 36.0 g/dL   RDW 75.1 02.5 - 85.2 %   Platelets 267 150 - 440 K/uL    Neutrophils Relative % 81 %   Neutro Abs 5.2 1.4 - 6.5 K/uL   Lymphocytes Relative 10 %   Lymphs Abs 0.7 (L) 1.0 - 3.6 K/uL   Monocytes Relative 9 %   Monocytes Absolute 0.6 0.2 - 0.9 K/uL   Eosinophils Relative 0 %   Eosinophils Absolute 0.0 0 - 0.7 K/uL   Basophils Relative 0 %   Basophils Absolute 0.0 0 - 0.1 K/uL      Assessment & Plan:   Problem List Items Addressed This Visit    None    Visit Diagnoses    Community acquired pneumonia of right lung, unspecified part of lung    -  Primary Subacute cough with partial response to two prior medications, but now worsening with return of productive cough 3 days ago.  Pt w/ prior immunosuppressants and need to resume therapy for MS.  Plan: 1. START azithromycin 250 mg tablets.  Take 2 tablets today, 1 tablet daily for 4 days beginning tomorrow. 2. Administer Rocephin 1 g IM today in clinic. 3. START albuterol 1-2 puffs every 6 hours as needed cough and wheezing. 4. Start benzonatate 100 mg tablet tid prn cough. 5. Followup 5 days if no improvement.   Relevant Medications   azithromycin (ZITHROMAX) 250 MG tablet   albuterol (PROVENTIL HFA;VENTOLIN HFA) 108 (90 Base) MCG/ACT inhaler   benzonatate (TESSALON) 100 MG capsule   cefTRIAXone (ROCEPHIN) injection 1 g (Start on 08/09/2017 10:00 AM)      Meds ordered this encounter  Medications  . azithromycin (ZITHROMAX) 250 MG tablet    Sig: Take two tablets today.  Then, take 1 tablet daily for 4 days.    Dispense:  6 tablet    Refill:  0    Order Specific Question:   Supervising Provider    Answer:   Smitty Cords [[id number]]  . albuterol (PROVENTIL HFA;VENTOLIN HFA) 108 (90 Base) MCG/ACT inhaler    Sig: Inhale 1-2 puffs into the lungs every 6 (six) hours as needed for wheezing or shortness of breath.    Dispense:  1 Inhaler    Refill:  5    Order Specific Question:   Supervising Provider    Answer:   Smitty Cords [[id number]]  . benzonatate (TESSALON) 100 MG  capsule    Sig: Take 1 capsule (100 mg total) by mouth 3 (three) times daily as needed for cough.    Dispense:  30 capsule    Refill:  0    Order Specific Question:   Supervising Provider    Answer:   Smitty Cords [[id number]]  . cefTRIAXone (ROCEPHIN) injection 1 g    Follow up plan: Return 5 days if symptoms worsen or fail to improve.  [name], DNP, AGPCNP-BC Adult Gerontology Primary Care Nurse Practitioner [facility] 08/09/2017, 9:31 AM

## 2017-08-10 ENCOUNTER — Encounter: Payer: Self-pay | Admitting: Neurology

## 2017-08-11 ENCOUNTER — Other Ambulatory Visit: Payer: Self-pay

## 2017-08-16 ENCOUNTER — Encounter: Payer: Self-pay | Admitting: Nurse Practitioner

## 2017-08-16 DIAGNOSIS — J189 Pneumonia, unspecified organism: Secondary | ICD-10-CM

## 2017-08-16 MED ORDER — AZITHROMYCIN 250 MG PO TABS: ORAL_TABLET | ORAL | 0 refills | Status: DC

## 2017-08-16 MED ORDER — AMOXICILLIN-POT CLAVULANATE ER 1000-62.5 MG PO TB12: 2.0000 | ORAL_TABLET | Freq: Two times a day (BID) | ORAL | 0 refills | Status: AC

## 2017-08-20 ENCOUNTER — Inpatient Hospital Stay: Payer: 59 | Attending: Hematology and Oncology

## 2017-08-20 DIAGNOSIS — E538 Deficiency of other specified B group vitamins: Secondary | ICD-10-CM | POA: Insufficient documentation

## 2017-08-20 MED ORDER — CYANOCOBALAMIN 1000 MCG/ML IJ SOLN
1000.0000 ug | Freq: Once | INTRAMUSCULAR | Status: AC
  Administered 2017-08-20: 1000 ug via INTRAMUSCULAR
  Filled 2017-08-20: qty 1

## 2017-08-24 ENCOUNTER — Encounter: Payer: Self-pay | Admitting: Neurology

## 2017-08-24 ENCOUNTER — Encounter: Payer: Self-pay | Admitting: Nurse Practitioner

## 2017-08-24 NOTE — Telephone Encounter (Signed)
Mychart message

## 2017-08-30 DIAGNOSIS — G35 Multiple sclerosis: Secondary | ICD-10-CM | POA: Diagnosis not present

## 2017-09-06 ENCOUNTER — Other Ambulatory Visit: Payer: Self-pay | Admitting: Neurology

## 2017-09-14 DIAGNOSIS — G35 Multiple sclerosis: Secondary | ICD-10-CM | POA: Diagnosis not present

## 2017-09-17 ENCOUNTER — Other Ambulatory Visit: Payer: Self-pay

## 2017-09-17 ENCOUNTER — Inpatient Hospital Stay: Payer: 59 | Attending: Hematology and Oncology

## 2017-09-17 DIAGNOSIS — I10 Essential (primary) hypertension: Secondary | ICD-10-CM

## 2017-09-17 DIAGNOSIS — E538 Deficiency of other specified B group vitamins: Secondary | ICD-10-CM | POA: Diagnosis present

## 2017-09-17 MED ORDER — LISINOPRIL 40 MG PO TABS: ORAL_TABLET | ORAL | 2 refills | Status: DC

## 2017-09-17 MED ORDER — CYANOCOBALAMIN 1000 MCG/ML IJ SOLN
1000.0000 ug | Freq: Once | INTRAMUSCULAR | Status: AC
  Administered 2017-09-17: 1000 ug via INTRAMUSCULAR
  Filled 2017-09-17: qty 1

## 2017-09-24 ENCOUNTER — Other Ambulatory Visit: Payer: Self-pay

## 2017-09-24 ENCOUNTER — Encounter: Payer: Self-pay | Admitting: Neurology

## 2017-09-24 ENCOUNTER — Ambulatory Visit (INDEPENDENT_AMBULATORY_CARE_PROVIDER_SITE_OTHER): Payer: 59 | Admitting: Neurology

## 2017-09-24 VITALS — BP 136/96 | HR 72 | Resp 18 | Ht 64.0 in | Wt 293.5 lb

## 2017-09-24 DIAGNOSIS — G35 Multiple sclerosis: Secondary | ICD-10-CM

## 2017-09-24 DIAGNOSIS — R269 Unspecified abnormalities of gait and mobility: Secondary | ICD-10-CM | POA: Diagnosis not present

## 2017-09-24 DIAGNOSIS — Z79899 Other long term (current) drug therapy: Secondary | ICD-10-CM | POA: Diagnosis not present

## 2017-09-24 DIAGNOSIS — R350 Frequency of micturition: Secondary | ICD-10-CM

## 2017-09-24 DIAGNOSIS — R5383 Other fatigue: Secondary | ICD-10-CM | POA: Diagnosis not present

## 2017-09-24 DIAGNOSIS — G4726 Circadian rhythm sleep disorder, shift work type: Secondary | ICD-10-CM

## 2017-09-24 NOTE — Progress Notes (Signed)
[facility]  PATIENT: Jody Lee DOB: [birth year]  REFERRING DOCTOR OR PCP:  Venora Maples SOURCE: patient and labs, notes in EMR and MRI on PACS  _________________________________   HISTORICAL  CHIEF COMPLAINT:  Chief Complaint  Patient presents with  . Multiple Sclerosis    Has had parts A and B of first Ocrevus infusion (last inf. 09/14/17).  Sts. she is feeling better. Some nausea and heartburn the day of and the day after Ocrevus. Right hand tingling that she had during Gilenya washout period has resolved/fim    HISTORY OF PRESENT ILLNESS:  Jody Lee is a 47 y.o. woman with MS diagnosed in 2005.     Update 09/24/2017: She completed the first 2 halves of the first ocrelizumab infusion last week. She did have some nausea and heartburn the day of the infusion an next day but otherwise felt fine.    Heartburn improved with Zantac.  She denies any new symptoms.   The hand tingling is resolved.      No new weakness.   Her gait is better but will occ stumble.     She can walk a mile.   Vision seems to be stable.   She did need a new prescription.  She has diarrhea since gastric bypass surgery.  She also has anemia and had B12 deficiency.  She has urinary frequency, unchanged in severity.   Modafinil has helped her fatigue and attentional issues.   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   Mood is doing well.    She sleeps well most nights.    She feels cognition is back to baseline.    Update 06/24/2017:   I personally reviewed the results and images of her 06/15/2017 MRI.  She has one enhancing lesion on the MRI of the brain and also has a new lesion that is nonenhancing in the cervical spine.  She notes more muscle stiffness and more right hand tingling the past 2 months.    In detail, we discussed several options for treatment due to her breakthrough while on Gilenya. Specifically we went over the risks and benefits of Tysabri,  Lemtrada and ocrelizumab. We will check blood work to determine if any of these are contraindicated. She is most interested in either Tysabri or ocrelizumab   Update 05/07/2017: She is on Gilenya for her relapsing multiple sclerosis. She tolerates it well. She has not had any definite exacerbation recently, though one week ago she had one day where she was dizzy the symptoms have since resolved.   When she had the vertigo, she felt her gait was tilted to the one side.     For the most part, gait does well but the right foot drags sometimes.     She denies significant numbness or weakness,    She needs stronger glasses and one day she felt her vision was cloudy for several hours.   She has some urinary frequency and notes bladder pain at times.      In general, fatigue is ok most days but she has occasional times it is worse.   She walks one mile daily as exercise.    She also had one week in August with severe fatigue.  She felt back to baseline the next week.  Mood is doing well.     Provigil has helped her.     After the last visit, she developed shingles. She feels she has completely recovered  from the shingles of the right thorax/axilla.    ______________________________ From 10/21/2016  MS:   She is on Gilenya and tolerates it well.     She has not had any definite exacerbation.   She has noted more rightleg spasms.   Spine MRI's showed several chronic plaques in her cervical spine and one in the thoracic spine.,    Gait/strength/sensation:  She sometimes stumbles due to her right foot being slightly weak.    Sometimes she veers while walking.  No falls.      She denies any major problem with weakness or numbness. She has more trouble using a can opener and occasionally is dropping items.     She gets right foot cramps, helped by baclofen..     Bladder: She notes worsening urinary frequency and urgency. She does not have incontinence.   She has fewer spasms.    Vision: She denies any MS  related visual problems.   She has new glasses  Fatigue/sleep: She reports physical and cognitive fatigue. She has noted a benefit with Provigil. Adderall was not well tolerated.     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   She also takes a lot of caffeine on days that she needs to stay up longer or if she is out of her Provigil. She has some difficulty falling asleep but once asleep often stays asleep.    Often she has difficulty turning her mind off.   Her fianc has noted that she moans that her sleep sometimes. She has not had any screaming, kicking or punching.  Mood/cognition: She denies any major difficulty with depression or anxiety at this time. She notes some cognitive difficulty. This is mostly problems with focusing and distraction.  She feels cognition does better with Provigil as well .Marland Kitchen She strays mentally active.  Other: Her anemia is doing better and she gets intermittent iron infusions and B12 monthly.   MS History:   She was diagnosed with MS in 2005 after presenting with a spinal cord syndrome. She started with numbness and pain in the feet that worked its way up to her abdomen over a couple days.   Initially, she had an MRI of the spine that showed a plaque consistent with her symptoms. She was referred to Dr. Maple Hudson. He did a lumbar puncture and ordered an MRI of the brain. Both were consistent with multiple sclerosis and I started to see her. Initially, she was placed on Betaseron and did very well for the first few years. Then around 2011 or 2012, she had an exacerbation and testing showed that she had developed antibodies against Betaseron. Therefore, she was switched to Gilenya at that time. She has been on Gilenya for the last 4+ years and has done well. Specifically, she has not reported any difficulties with exacerbations and she tolerates the medication well.   Her last MRI was performed at  [facility] 2015.  MRI of the brain from 03/02/2013 showed  typical periventricular T2/FLAIR hyperintense foci, predominantly in the periventricular white matter.   There were no acute foci.   REVIEW OF SYSTEMS: Constitutional: No fevers, chills, sweats, or change in appetite.   She notes fatigue. Eyes: No visual changes, double vision, eye pain Ear, nose and throat: No hearing loss, ear pain, nasal congestion, sore throat Cardiovascular: No chest pain, palpitations Respiratory: No shortness of breath at rest or  with exertion.   No wheezes GastrointestinaI: No nausea, vomiting, diarrhea, abdominal pain, fecal incontinence Genitourinary: No dysuria, urinary retention or frequency.  No nocturia. Musculoskeletal: No neck pain, back pain Integumentary: No rash, pruritus, skin lesions Neurological: as above Psychiatric: No depression at this time.  No anxiety Endocrine: No palpitations, diaphoresis, change in appetite, change in weigh or increased thirst Hematologic/Lymphatic: No anemia, purpura, petechiae. Allergic/Immunologic: No itchy/runny eyes, nasal congestion, recent allergic reactions, rashes  ALLERGIES: Allergies  Allergen Reactions  . Nsaids Other (See Comments)    Not supposed to take d/t bariatric surgery   . Ciprofloxacin Rash    HOME MEDICATIONS:  Current Outpatient Medications:  .  acetaminophen (TYLENOL) 325 MG tablet, Take 650 mg by mouth every 4 (four) hours as needed., Disp: , Rfl:  .  baclofen (LIORESAL) 10 MG tablet, TAKE 1 TABLET(10 MG) BY MOUTH THREE TIMES DAILY, Disp: 90 tablet, Rfl: 5 .  Biotin 10 MG CAPS, Take 1 capsule by mouth daily., Disp: , Rfl:  .  FeFum-FePo-FA-B Cmp-C-Zn-Mn-Cu (TANDEM PLUS) 162-115.2-1 MG CAPS, Take 1 tablet by mouth daily., Disp: 90 each, Rfl: 4 .  folic acid (FOLVITE) 1 MG tablet, Take 2 mg by mouth daily., Disp: , Rfl:  .  hydrochlorothiazide (MICROZIDE) 12.5 MG capsule, Take 12.5 mg by mouth daily. ,  Disp: , Rfl:  .  lisinopril (PRINIVIL,ZESTRIL) 40 MG tablet, TAKE 1 TABLET DAILY, Disp: 90 tablet, Rfl: 2 .  loratadine (CLARITIN) 10 MG tablet, Take 10 mg by mouth daily., Disp: , Rfl:  .  MedroxyPROGESTERone Acetate 150 MG/ML SUSY, INJECT 1 ML (150 MG) INTRAMUSCULARLY EVERY 3 MONTHS, Disp: 1 mL, Rfl: 3 .  modafinil (PROVIGIL) 200 MG tablet, TAKE 1 TABLET BY MOUTH DAILY., Disp: 30 tablet, Rfl: 5 .  ocrelizumab 600 mg in sodium chloride 0.9 % 500 mL, Inject 600 mg into the vein once., Disp: , Rfl:  .  omeprazole (PRILOSEC) 20 MG capsule, Take 20 mg by mouth 2 (two) times daily as needed., Disp: , Rfl:  .  zinc sulfate 220 (50 Zn) MG capsule, Take 220 mg by mouth daily., Disp: , Rfl:  .  albuterol (PROVENTIL HFA;VENTOLIN HFA) 108 (90 Base) MCG/ACT inhaler, Inhale 1-2 puffs into the lungs every 6 (six) hours as needed for wheezing or shortness of breath., Disp: 1 Inhaler, Rfl: 5 .  azithromycin (ZITHROMAX) 250 MG tablet, Take two tablets today.  Then, take 1 tablet daily for 4 days., Disp: 6 tablet, Rfl: 0 .  benzonatate (TESSALON) 100 MG capsule, Take 1 capsule (100 mg total) by mouth 3 (three) times daily as needed for cough., Disp: 30 capsule, Rfl: 0 No current facility-administered medications for this visit.   Facility-Administered Medications Ordered in Other Visits:  .  gadopentetate dimeglumine (MAGNEVIST) injection 20 mL, 20 mL, Intravenous, Once PRN, Kerilyn Cortner, Pearletha Furl, MD  PAST MEDICAL HISTORY: Past Medical History:  Diagnosis Date  . Anemia   . Hypertension   . Menorrhagia   . MS (multiple sclerosis) (HCC)   . Shingles    right side of chest and back  . Vision abnormalities   . Vitamin B 12 deficiency   . Vitamin D deficiency     PAST SURGICAL HISTORY: Past Surgical History:  Procedure Laterality Date  . CHOLECYSTECTOMY    . GASTRIC BYPASS      FAMILY HISTORY: Family History  Problem Relation Age of Onset  . Stroke Mother   . Heart disease Mother   . Asthma Mother     .  Diabetes Mother   . Hypertension Father   . Heart disease Father   . Diabetes Father   . Asthma Brother   . Alcoholism Brother   . Obesity Brother   . Heart disease Brother   . Diabetes Maternal Grandmother   . Lung cancer Maternal Grandfather   . Breast cancer Neg Hx     SOCIAL HISTORY:  Social History   Socioeconomic History  . Marital status: Single    Spouse name: Not on file  . Number of children: 1  . Years of education: Not on file  . Highest education level: Not on file  Social Needs  . Financial resource strain: Not on file  . Food insecurity - worry: Not on file  . Food insecurity - inability: Not on file  . Transportation needs - medical: Not on file  . Transportation needs - non-medical: Not on file  Occupational History  . Occupation: Magazine features editor  Tobacco Use  . Smoking status: Never Smoker  . Smokeless tobacco: Never Used  Substance and Sexual Activity  . Alcohol use: No  . Drug use: No  . Sexual activity: Yes    Birth control/protection: Injection  Other Topics Concern  . Not on file  Social History Narrative   Remarried in 2018. Has one daughter, one grandchild and 2 step children. Works from home     PHYSICAL EXAM  Vitals:   09/24/17 0954  BP: (!) 136/96  Pulse: 72  Resp: 18  Weight: 293 lb 8 oz (133.1 kg)  Height: 5\' 4"  (1.626 m)    Body mass index is 50.38 kg/m.   General: The patient is well-developed and well-nourished and in no acute distress   Neck: The neck has good ROM  Neurologic Exam  Mental status: The patient is alert and oriented x 3 at the time of the examination. The patient has apparent normal recent and remote memory, with an apparently normal attention span and concentration ability.   Speech is normal.  Cranial nerves: Extraocular muscles are normal. Facial strength and sensation is normal. Hearing is normal and symmetric. Trapezius strength is normal.   Motor:  Muscle bulk is normal.   Muscle tone is normal  and strength is full in the arms or legs.   Sensory: Sensory testing is intact to touch and vibration sensation in all 4 extremities.  Coordination: Finger-nose-finger is performed well..  Gait and station: Station is normal.  Her gait is now normal and the tandem gait is mildly wide... Romberg is negative.   Reflexes: Deep tendon reflexes are symmetric and normal bilaterally.        DIAGNOSTIC DATA (LABS, IMAGING, TESTING) - I reviewed patient records, labs, notes, testing and imaging myself where available.  Lab Results  Component Value Date   WBC 6.5 07/12/2017   HGB 13.1 07/12/2017   HCT 39.7 07/12/2017   MCV 89.6 07/12/2017   PLT 267 07/12/2017      Component Value Date/Time   NA 144 06/24/2017 1439   NA 139 06/27/2014 1356   K 4.5 06/24/2017 1439   K 3.9 06/27/2014 1356   CL 106 06/24/2017 1439   CL 106 06/27/2014 1356   CO2 22 06/24/2017 1439   CO2 28 06/27/2014 1356   GLUCOSE 88 06/24/2017 1439   GLUCOSE 95 03/07/2015 1045   GLUCOSE 89 06/27/2014 1356   BUN 12 06/24/2017 1439   BUN 7 06/27/2014 1356   CREATININE 1.08 (H) 06/24/2017 1439   CREATININE 0.99 06/27/2014 1356  CALCIUM 9.2 06/24/2017 1439   CALCIUM 8.3 (L) 06/27/2014 1356   PROT 6.5 06/24/2017 1439   PROT 6.9 06/27/2014 1356   ALBUMIN 4.3 06/24/2017 1439   ALBUMIN 3.8 06/27/2014 1356   AST 18 06/24/2017 1439   AST 15 06/27/2014 1356   ALT 19 06/24/2017 1439   ALT 20 06/27/2014 1356   ALKPHOS 114 06/24/2017 1439   ALKPHOS 105 06/27/2014 1356   BILITOT 0.2 06/24/2017 1439   BILITOT 0.4 06/27/2014 1356   GFRNONAA 62 06/24/2017 1439   GFRNONAA >60 06/27/2014 1356   GFRNONAA >60 12/26/2013 1031   GFRAA 72 06/24/2017 1439   GFRAA >60 06/27/2014 1356   GFRAA >60 12/26/2013 1031    Lab Results  Component Value Date   VITAMINB12 251 05/28/2015   Lab Results  Component Value Date   TSH 1.887 03/07/2015       ASSESSMENT AND PLAN  Multiple sclerosis (HCC)  Other fatigue  Gait  disturbance  Shifting sleep-work schedule  Urinary frequency  High risk medication use   1.    She will continue ocrelizumab for MS. She broke through Gilenya. About the time the next visit, I will want to check IgG/IgM. Additionally later this year we will need to check another MRI to make sure that she is not having subclinical progression. 2.   Continue Provigil for her shift work disorder and fatigue and sleepiness. 3,    She will return in 6 months or sooner if there are new or worsening neurologic symptoms.    Jaleeyah Munce A. Epimenio Foot, MD, PhD 09/24/2017, 10:16 AM Certified in Neurology, Clinical Neurophysiology, Sleep Medicine, Pain Medicine and Neuroimaging  [facility] [address], [address] [place], [place] [address] [phone]

## 2017-10-15 ENCOUNTER — Ambulatory Visit (INDEPENDENT_AMBULATORY_CARE_PROVIDER_SITE_OTHER): Payer: 59 | Admitting: Nurse Practitioner

## 2017-10-15 ENCOUNTER — Inpatient Hospital Stay: Payer: 59 | Attending: Hematology and Oncology

## 2017-10-15 ENCOUNTER — Ambulatory Visit (INDEPENDENT_AMBULATORY_CARE_PROVIDER_SITE_OTHER): Payer: 59

## 2017-10-15 ENCOUNTER — Other Ambulatory Visit: Payer: Self-pay

## 2017-10-15 ENCOUNTER — Encounter: Payer: Self-pay | Admitting: Nurse Practitioner

## 2017-10-15 ENCOUNTER — Inpatient Hospital Stay: Payer: 59

## 2017-10-15 VITALS — BP 133/74 | HR 61 | Temp 97.8°F | Ht 64.0 in | Wt 296.8 lb

## 2017-10-15 DIAGNOSIS — I10 Essential (primary) hypertension: Secondary | ICD-10-CM | POA: Diagnosis not present

## 2017-10-15 DIAGNOSIS — E538 Deficiency of other specified B group vitamins: Secondary | ICD-10-CM | POA: Diagnosis not present

## 2017-10-15 DIAGNOSIS — Z3042 Encounter for surveillance of injectable contraceptive: Secondary | ICD-10-CM

## 2017-10-15 DIAGNOSIS — Z0000 Encounter for general adult medical examination without abnormal findings: Secondary | ICD-10-CM | POA: Diagnosis not present

## 2017-10-15 DIAGNOSIS — E559 Vitamin D deficiency, unspecified: Secondary | ICD-10-CM | POA: Diagnosis not present

## 2017-10-15 DIAGNOSIS — D508 Other iron deficiency anemias: Secondary | ICD-10-CM

## 2017-10-15 MED ORDER — MEDROXYPROGESTERONE ACETATE 150 MG/ML IM SUSP
150.0000 mg | Freq: Once | INTRAMUSCULAR | Status: AC
  Administered 2017-10-15: 150 mg via INTRAMUSCULAR

## 2017-10-15 MED ORDER — CYANOCOBALAMIN 1000 MCG/ML IJ SOLN
1000.0000 ug | Freq: Once | INTRAMUSCULAR | Status: AC
  Administered 2017-10-15: 1000 ug via INTRAMUSCULAR
  Filled 2017-10-15: qty 1

## 2017-10-15 MED ORDER — LISINOPRIL 40 MG PO TABS: ORAL_TABLET | ORAL | 3 refills | Status: DC

## 2017-10-15 MED ORDER — HYDROCHLOROTHIAZIDE 12.5 MG PO CAPS: 12.5000 mg | ORAL_CAPSULE | Freq: Every day | ORAL | 3 refills | Status: DC

## 2017-10-15 NOTE — Progress Notes (Signed)
Subjective:    Patient ID: Jody Lee, female    DOB: 1971-07-14, 47 y.o.   MRN: [id number]  Jody Lee is a 47 y.o. female presenting on 10/15/2017 for Annual Exam   HPI   Annual Physical Exam Patient has been feeling well with some improvement of MS on new medication ocrelizumab.  She has acute concerns today regarding recent hearing loss. Sleeps 5 hours per night usually uninterrupted.  R ear has had hearing dulled and fullness.  HEALTH MAINTENANCE: Weight/BMI: increasing an dmorbid obesity Physical activity: sedentary recently (increased MS) Diet: First step: sweet tea- switch to artificial sweetener and sugarfree desserts.   Seatbelt: always (not in backseat) Sunscreen: usually withexposure PAP: last year at [facility] Mammogram: [facility] DEXA:  - None.  Is still using Depo.   Colon Cancer Screen: has had polyps and removal of colon - declines screening to age 35 HIV/HEP C: up to date Dentistry: dentures  VACCINES: Tetanus: given 08/2015 Influenza: given 05/2017   Past Medical History:  Diagnosis Date  . Anemia   . Hypertension   . Menorrhagia   . MS (multiple sclerosis) (HCC)   . Shingles    right side of chest and back  . Vision abnormalities   . Vitamin B 12 deficiency   . Vitamin D deficiency    Past Surgical History:  Procedure Laterality Date  . CHOLECYSTECTOMY    . GASTRIC BYPASS     Social History   Socioeconomic History  . Marital status: Single    Spouse name: Not on file  . Number of children: 1  . Years of education: Not on file  . Highest education level: Not on file  Social Needs  . Financial resource strain: Not on file  . Food insecurity - worry: Not on file  . Food insecurity - inability: Not on file  . Transportation needs - medical: Not on file  . Transportation needs - non-medical: Not on file  Occupational History  . Occupation: Magazine features editor  Tobacco Use  . Smoking status: Never Smoker  . Smokeless tobacco: Never Used    Substance and Sexual Activity  . Alcohol use: No  . Drug use: No  . Sexual activity: Yes    Birth control/protection: Injection  Other Topics Concern  . Not on file  Social History Narrative   Remarried in 2018. Has one daughter, one grandchild and 2 step children. Works from home   Family History  Problem Relation Age of Onset  . Stroke Mother   . Heart disease Mother   . Asthma Mother   . Diabetes Mother   . Hypertension Father   . Heart disease Father   . Diabetes Father   . Asthma Brother   . Alcoholism Brother   . Obesity Brother   . Heart disease Brother   . Diabetes Maternal Grandmother   . Lung cancer Maternal Grandfather   . Breast cancer Neg Hx    Current Outpatient Medications on File Prior to Visit  Medication Sig  . acetaminophen (TYLENOL) 325 MG tablet Take 650 mg by mouth every 4 (four) hours as needed.  . baclofen (LIORESAL) 10 MG tablet TAKE 1 TABLET(10 MG) BY MOUTH THREE TIMES DAILY  . Biotin 10 MG CAPS Take 1 capsule by mouth daily.  Marland Kitchen FeFum-FePo-FA-B Cmp-C-Zn-Mn-Cu (TANDEM PLUS) 162-115.2-1 MG CAPS Take 1 tablet by mouth daily.  . folic acid (FOLVITE) 1 MG tablet Take 2 mg by mouth daily.  Marland Kitchen loratadine (CLARITIN) 10 MG tablet  Take 10 mg by mouth daily.  . MedroxyPROGESTERone Acetate 150 MG/ML SUSY INJECT 1 ML (150 MG) INTRAMUSCULARLY EVERY 3 MONTHS  . modafinil (PROVIGIL) 200 MG tablet TAKE 1 TABLET BY MOUTH DAILY.  Marland Kitchen ocrelizumab 600 mg in sodium chloride 0.9 % 500 mL Inject 600 mg into the vein once.  Marland Kitchen omeprazole (PRILOSEC) 20 MG capsule Take 20 mg by mouth 2 (two) times daily as needed.  . zinc sulfate 220 (50 Zn) MG capsule Take 220 mg by mouth daily.   Current Facility-Administered Medications on File Prior to Visit  Medication  . gadopentetate dimeglumine (MAGNEVIST) injection 20 mL    Review of Systems Per HPI unless specifically indicated above      Objective:    BP 133/74 (BP Location: Right Arm, Patient Position: Sitting, Cuff Size:  Normal)   Pulse 61   Temp 97.8 F (36.6 C) (Oral)   Ht 5\' 4"  (1.626 m)   Wt 296 lb 12.8 oz (134.6 kg)   BMI 50.95 kg/m   Wt Readings from Last 3 Encounters:  10/15/17 296 lb 12.8 oz (134.6 kg)  09/24/17 293 lb 8 oz (133.1 kg)  08/09/17 291 lb 9.6 oz (132.3 kg)     Hearing Screening   125Hz  250Hz  500Hz  1000Hz  2000Hz  3000Hz  4000Hz  6000Hz  8000Hz   Right ear:   Pass Pass Pass  Pass    Left ear:   Pass Pass Pass  Pass      Physical Exam  Constitutional: She is oriented to person, place, and time. She appears well-developed and well-nourished. No distress.  HENT:  Head: Normocephalic and atraumatic.  Right Ear: External ear normal.  Left Ear: External ear normal.  Nose: Nose normal.  Mouth/Throat: Oropharynx is clear and moist.  Mild alopecia  Eyes: Conjunctivae are normal. Pupils are equal, round, and reactive to light.  Neck: Normal range of motion. Neck supple. No JVD present. No tracheal deviation present. No thyromegaly present.  Cardiovascular: Normal rate, regular rhythm, normal heart sounds and intact distal pulses. Exam reveals no gallop and no friction rub.  No murmur heard. Pulmonary/Chest: Effort normal and breath sounds normal. No respiratory distress.  Abdominal: Soft. Bowel sounds are normal. She exhibits no distension. There is no tenderness.  Genitourinary:  Genitourinary Comments: Pt defers GYN and breast exams today.  Obtains regular annual exam with OB-GYN and has no complaints today.  Musculoskeletal: Normal range of motion.  Lymphadenopathy:    She has no cervical adenopathy.  Neurological: She is alert and oriented to person, place, and time. No cranial nerve deficit.  Skin: Skin is warm and dry.  Psychiatric: She has a normal mood and affect. Her behavior is normal. Judgment and thought content normal.  Nursing note and vitals reviewed.  Results for orders placed or performed in visit on 07/12/17  Folate  Result Value Ref Range   Folate 47.0 >5.9 ng/mL    Iron and TIBC  Result Value Ref Range   Iron 63 28 - 170 ug/dL   TIBC 572 620 - 355 ug/dL   Saturation Ratios 22 10.4 - 31.8 %   UIBC 219 ug/dL  Ferritin  Result Value Ref Range   Ferritin 103 11 - 307 ng/mL  CBC with Differential/Platelet  Result Value Ref Range   WBC 6.5 3.6 - 11.0 K/uL   RBC 4.43 3.80 - 5.20 MIL/uL   Hemoglobin 13.1 12.0 - 16.0 g/dL   HCT 97.4 16.3 - 84.5 %   MCV 89.6 80.0 - 100.0 fL  MCH 29.6 26.0 - 34.0 pg   MCHC 33.1 32.0 - 36.0 g/dL   RDW 16.1 09.6 - 04.5 %   Platelets 267 150 - 440 K/uL   Neutrophils Relative % 81 %   Neutro Abs 5.2 1.4 - 6.5 K/uL   Lymphocytes Relative 10 %   Lymphs Abs 0.7 (L) 1.0 - 3.6 K/uL   Monocytes Relative 9 %   Monocytes Absolute 0.6 0.2 - 0.9 K/uL   Eosinophils Relative 0 %   Eosinophils Absolute 0.0 0 - 0.7 K/uL   Basophils Relative 0 %   Basophils Absolute 0.0 0 - 0.1 K/uL      Assessment & Plan:   Problem List Items Addressed This Visit      Cardiovascular and Mediastinum   Benign essential hypertension Pt requests refills today. BP at goal.  Refills provided.   Relevant Medications   hydrochlorothiazide (MICROZIDE) 12.5 MG capsule   lisinopril (PRINIVIL,ZESTRIL) 40 MG tablet    Other Visit Diagnoses    Annual physical exam    -  Primary Physical exam with no new findings.  Well adult with no acute concerns.  Plan: 1. Obtain health maintenance screenings. - Labs as below.  Add Hematology labs for collection today to in-clinic lab draw. - Increase physical activity to 30 minutes most days of the week. - Encouraged low glycemic diet to assist with weight management. 2. Return 1 year for annual physical.    Relevant Orders   Comprehensive Metabolic Panel (CMET)   Hemoglobin A1c   CBC with Differential   COMPLETE METABOLIC PANEL WITH GFR   TSH   Vitamin D (25 hydroxy)   Ferritin   Lipid panel      Meds ordered this encounter  Medications  . hydrochlorothiazide (MICROZIDE) 12.5 MG capsule    Sig:  Take 1 capsule (12.5 mg total) by mouth daily.    Dispense:  90 capsule    Refill:  3    Order Specific Question:   Supervising Provider    Answer:   Smitty Cords [[id number]]  . lisinopril (PRINIVIL,ZESTRIL) 40 MG tablet    Sig: TAKE 1 TABLET DAILY    Dispense:  90 tablet    Refill:  3    Order Specific Question:   Supervising Provider    Answer:   Smitty Cords [[id number]]    Follow up plan: Return in about 1 year (around 10/16/2018) for annual physical.  [name], DNP, AGPCNP-BC Adult Gerontology Primary Care Nurse Practitioner [facility] 10/15/2017, 1:32 PM

## 2017-10-15 NOTE — Patient Instructions (Addendum)
Jody Lee, Thank you for coming in to clinic today.  1. Increase your physical activity until you are increasing your heart rate for 30 minutes on most days of the week.  2. Work to eat a low glycemic diet (handout given in clinic).  Please schedule a follow-up appointment with [name], AGNP. Return in about 1 year (around 10/16/2018) for annual physical.  If you have any other questions or concerns, please feel free to call the clinic or send a message through MyChart. You may also schedule an earlier appointment if necessary.  You will receive a survey after today's visit either digitally by e-mail or paper by [employer]. Your experiences and feedback matter to [place].  Please respond so we know how we are doing as we provide care for you.   [name], DNP, AGNP-BC Adult Gerontology Nurse Practitioner [facility], [facility]

## 2017-10-16 LAB — CBC WITH DIFFERENTIAL/PLATELET
Basophils Absolute: 27 cells/uL (ref 0–200)
Basophils Relative: 0.4 %
Eosinophils Absolute: 0 cells/uL — ABNORMAL LOW (ref 15–500)
Eosinophils Relative: 0 %
HCT: 40.2 % (ref 35.0–45.0)
Hemoglobin: 13.1 g/dL (ref 11.7–15.5)
Lymphs Abs: 931 cells/uL (ref 850–3900)
MCH: 29.2 pg (ref 27.0–33.0)
MCHC: 32.6 g/dL (ref 32.0–36.0)
MCV: 89.7 fL (ref 80.0–100.0)
MPV: 11.2 fL (ref 7.5–12.5)
Monocytes Relative: 9.9 %
Neutro Abs: 5079 cells/uL (ref 1500–7800)
Neutrophils Relative %: 75.8 %
Platelets: 289 10*3/uL (ref 140–400)
RBC: 4.48 10*6/uL (ref 3.80–5.10)
RDW: 11.8 % (ref 11.0–15.0)
Total Lymphocyte: 13.9 %
WBC mixed population: 663 cells/uL (ref 200–950)
WBC: 6.7 10*3/uL (ref 3.8–10.8)

## 2017-10-16 LAB — COMPLETE METABOLIC PANEL WITH GFR
AG Ratio: 1.8 (calc) (ref 1.0–2.5)
ALT: 14 U/L (ref 6–29)
AST: 16 U/L (ref 10–35)
Albumin: 4.3 g/dL (ref 3.6–5.1)
Alkaline phosphatase (APISO): 114 U/L (ref 33–115)
BUN: 9 mg/dL (ref 7–25)
CO2: 27 mmol/L (ref 20–32)
Calcium: 9.5 mg/dL (ref 8.6–10.2)
Chloride: 107 mmol/L (ref 98–110)
Creat: 0.9 mg/dL (ref 0.50–1.10)
GFR, Est African American: 89 mL/min/{1.73_m2} (ref >=60)
GFR, Est Non African American: 77 mL/min/{1.73_m2} (ref >=60)
Globulin: 2.4 g/dL (calc) (ref 1.9–3.7)
Glucose, Bld: 83 mg/dL (ref 65–99)
Potassium: 4.2 mmol/L (ref 3.5–5.3)
Sodium: 142 mmol/L (ref 135–146)
Total Bilirubin: 0.5 mg/dL (ref 0.2–1.2)
Total Protein: 6.7 g/dL (ref 6.1–8.1)

## 2017-10-16 LAB — VITAMIN D 25 HYDROXY (VIT D DEFICIENCY, FRACTURES): Vit D, 25-Hydroxy: 8 ng/mL — ABNORMAL LOW (ref 30–100)

## 2017-10-16 LAB — HEMOGLOBIN A1C
Hgb A1c MFr Bld: 5.3 % of total Hgb (ref <5.7)
Mean Plasma Glucose: 105 (calc)
eAG (mmol/L): 5.8 (calc)

## 2017-10-16 LAB — FERRITIN: Ferritin: 62 ng/mL (ref 10–232)

## 2017-10-16 LAB — TSH: TSH: 2.14 mIU/L

## 2017-10-18 ENCOUNTER — Other Ambulatory Visit: Payer: Self-pay | Admitting: Nurse Practitioner

## 2017-10-18 DIAGNOSIS — E559 Vitamin D deficiency, unspecified: Secondary | ICD-10-CM

## 2017-10-18 MED ORDER — VITAMIN D (ERGOCALCIFEROL) 1.25 MG (50000 UNIT) PO CAPS: 50000.0000 [IU] | ORAL_CAPSULE | ORAL | 0 refills | Status: DC

## 2017-10-22 ENCOUNTER — Other Ambulatory Visit: Payer: Self-pay

## 2017-10-22 ENCOUNTER — Encounter: Payer: Self-pay | Admitting: Nurse Practitioner

## 2017-10-22 DIAGNOSIS — E559 Vitamin D deficiency, unspecified: Secondary | ICD-10-CM

## 2017-10-22 MED ORDER — VITAMIN D (ERGOCALCIFEROL) 1.25 MG (50000 UNIT) PO CAPS: 50000.0000 [IU] | ORAL_CAPSULE | ORAL | 0 refills | Status: AC

## 2017-10-29 ENCOUNTER — Ambulatory Visit: Payer: 59

## 2017-11-01 ENCOUNTER — Other Ambulatory Visit: Payer: Self-pay | Admitting: Neurology

## 2017-11-05 ENCOUNTER — Ambulatory Visit: Payer: 59 | Admitting: Neurology

## 2017-11-11 ENCOUNTER — Ambulatory Visit (INDEPENDENT_AMBULATORY_CARE_PROVIDER_SITE_OTHER): Payer: 59 | Admitting: Neurology

## 2017-11-11 ENCOUNTER — Other Ambulatory Visit: Payer: Self-pay

## 2017-11-11 DIAGNOSIS — G35 Multiple sclerosis: Secondary | ICD-10-CM

## 2017-11-11 NOTE — Progress Notes (Signed)
Patient was seen recently and no new issues.so follow up will be rescheduled

## 2017-11-19 ENCOUNTER — Other Ambulatory Visit: Payer: Self-pay | Admitting: Urgent Care

## 2017-11-19 ENCOUNTER — Inpatient Hospital Stay: Payer: 59 | Attending: Hematology and Oncology

## 2017-11-19 DIAGNOSIS — E538 Deficiency of other specified B group vitamins: Secondary | ICD-10-CM | POA: Diagnosis not present

## 2017-11-19 MED ORDER — CYANOCOBALAMIN 1000 MCG/ML IJ SOLN
1000.0000 ug | Freq: Once | INTRAMUSCULAR | Status: AC
  Administered 2017-11-19: 1000 ug via INTRAMUSCULAR

## 2017-12-02 ENCOUNTER — Other Ambulatory Visit: Payer: Self-pay | Admitting: Neurology

## 2017-12-17 ENCOUNTER — Inpatient Hospital Stay: Payer: 59

## 2017-12-24 ENCOUNTER — Encounter: Payer: Self-pay | Admitting: Neurology

## 2017-12-31 ENCOUNTER — Ambulatory Visit (INDEPENDENT_AMBULATORY_CARE_PROVIDER_SITE_OTHER): Payer: 59 | Admitting: Nurse Practitioner

## 2017-12-31 ENCOUNTER — Encounter: Payer: Self-pay | Admitting: Nurse Practitioner

## 2017-12-31 ENCOUNTER — Ambulatory Visit: Payer: 59 | Admitting: Family Medicine

## 2017-12-31 VITALS — BP 129/83 | HR 68 | Temp 97.9°F | Resp 16 | Ht 64.0 in | Wt 294.0 lb

## 2017-12-31 DIAGNOSIS — M79671 Pain in right foot: Secondary | ICD-10-CM | POA: Diagnosis not present

## 2017-12-31 DIAGNOSIS — Z6841 Body Mass Index (BMI) 40.0 and over, adult: Secondary | ICD-10-CM | POA: Diagnosis not present

## 2017-12-31 DIAGNOSIS — M25571 Pain in right ankle and joints of right foot: Secondary | ICD-10-CM

## 2017-12-31 MED ORDER — NAPROXEN 500 MG PO TABS: 500.0000 mg | ORAL_TABLET | Freq: Two times a day (BID) | ORAL | 0 refills | Status: DC

## 2017-12-31 NOTE — Progress Notes (Signed)
Subjective:    Patient ID: Jody Lee, female    DOB: 24-Mar-1971, 47 y.o.   MRN: [id number]  Jody Lee is a 47 y.o. female presenting on 12/31/2017 for Foot Swelling (painful right foot prolonged work sitting hours from home noticed swollen foot painful ROM)   HPI Right foot pain and swelling Onset of pain occurred after working a long day with little overnight rest. - Tuesday worked at home sitting at a desk from 7a-3p.Takes a break for evening, worked again 11p-3a.  Slept, - Wed worked again early am-5p.  Walked but was uncomfortable.   - Thursday am awoke with severe pain - was able walk on lateral edge of foot only 2/2 pain in lower foot and up medial ankle.  Has less pain with shoes, so has worn shoes with more stability and was able to walk.   - Pain continues to worsen today with moderately severe pain with weight bearing that is present across plantar surface of foot and in heel. Pain is also severe in medial ankle up to mid calf.  Today, could not walk comfortably at all even on lateral edge of foot.  Has used cane today.  Has used shoes again, but is still worse than yesterday. - Baclofen and ibuprofen last night with moderate relief, but ibuprofen provides only minimal relief during day. - Swelling worsens through the day, but resolves overnight.  - Only new medication: DoTerra vitamin pack x 6 weeks.  No prior problems with these supplements.  - Pt with prior history of phlebitis of left leg.  Pt is concerned for possibility of recurrence on R.  Social History   Tobacco Use  . Smoking status: Never Smoker  . Smokeless tobacco: Never Used  Substance Use Topics  . Alcohol use: No  . Drug use: No    Review of Systems Per HPI unless specifically indicated above     Objective:    BP 129/83   Pulse 68   Temp 97.9 F (36.6 C) (Oral)   Resp 16   Ht 5\' 4"  (1.626 m)   Wt 294 lb (133.4 kg)   BMI 50.46 kg/m   Wt Readings from Last 3 Encounters:  12/31/17 294 lb  (133.4 kg)  10/15/17 296 lb 12.8 oz (134.6 kg)  09/24/17 293 lb 8 oz (133.1 kg)    Physical Exam  Constitutional: She is oriented to person, place, and time. She appears well-developed and well-nourished. No distress.  HENT:  Head: Normocephalic and atraumatic.  Musculoskeletal:       Right ankle: She exhibits decreased range of motion (decreased eversion/inversion with mild pain.  normal flex/ext.). She exhibits no swelling, no ecchymosis, no laceration and normal pulse. No lateral malleolus, no medial malleolus, no AITFL, no CF ligament, no posterior TFL, no head of 5th metatarsal and no proximal fibula tenderness found. Achilles tendon exhibits pain (mild pain upon palpation of achille's). Achilles tendon exhibits no defect.       Right foot: There is tenderness (mildly tender with deep pressure over plantar surface of foot.  Only mildly tender over 3rd metatarsal at dorsum/midfoot region). There is normal range of motion, no bony tenderness, no swelling, normal capillary refill, no crepitus, no deformity and no laceration.       Feet:   No ecchymosis noted  Neurological: She is alert and oriented to person, place, and time.  Skin: Skin is warm and dry.  Reddened skin of 1/3 distal lower extremity bilaterally c/w PVD  Psychiatric: She has a normal mood and affect. Her behavior is normal. Judgment and thought content normal.  Vitals reviewed.    Results for orders placed or performed in visit on 10/15/17  Hemoglobin A1c  Result Value Ref Range   Hgb A1c MFr Bld 5.3 <5.7 % of total Hgb   Mean Plasma Glucose 105 (calc)   eAG (mmol/L) 5.8 (calc)  CBC with Differential  Result Value Ref Range   WBC 6.7 3.8 - 10.8 Thousand/uL   RBC 4.48 3.80 - 5.10 Million/uL   Hemoglobin 13.1 11.7 - 15.5 g/dL   HCT 91.4 78.2 - 95.6 %   MCV 89.7 80.0 - 100.0 fL   MCH 29.2 27.0 - 33.0 pg   MCHC 32.6 32.0 - 36.0 g/dL   RDW 21.3 08.6 - 57.8 %   Platelets 289 140 - 400 Thousand/uL   MPV 11.2 7.5 - 12.5  fL   Neutro Abs 5,079 1,500 - 7,800 cells/uL   Lymphs Abs 931 850 - 3,900 cells/uL   WBC mixed population 663 200 - 950 cells/uL   Eosinophils Absolute 0 (L) 15 - 500 cells/uL   Basophils Absolute 27 0 - 200 cells/uL   Neutrophils Relative % 75.8 %   Total Lymphocyte 13.9 %   Monocytes Relative 9.9 %   Eosinophils Relative 0.0 %   Basophils Relative 0.4 %  COMPLETE METABOLIC PANEL WITH GFR  Result Value Ref Range   Glucose, Bld 83 65 - 99 mg/dL   BUN 9 7 - 25 mg/dL   Creat 4.69 6.29 - 5.28 mg/dL   GFR, Est Non African American 77 > OR = 60 mL/min/1.63m2   GFR, Est African American 89 > OR = 60 mL/min/1.50m2   BUN/Creatinine Ratio NOT APPLICABLE 6 - 22 (calc)   Sodium 142 135 - 146 mmol/L   Potassium 4.2 3.5 - 5.3 mmol/L   Chloride 107 98 - 110 mmol/L   CO2 27 20 - 32 mmol/L   Calcium 9.5 8.6 - 10.2 mg/dL   Total Protein 6.7 6.1 - 8.1 g/dL   Albumin 4.3 3.6 - 5.1 g/dL   Globulin 2.4 1.9 - 3.7 g/dL (calc)   AG Ratio 1.8 1.0 - 2.5 (calc)   Total Bilirubin 0.5 0.2 - 1.2 mg/dL   Alkaline phosphatase (APISO) 114 33 - 115 U/L   AST 16 10 - 35 U/L   ALT 14 6 - 29 U/L  TSH  Result Value Ref Range   TSH 2.14 mIU/L  Vitamin D (25 hydroxy)  Result Value Ref Range   Vit D, 25-Hydroxy 8 (L) 30 - 100 ng/mL  Ferritin  Result Value Ref Range   Ferritin 62 10 - 232 ng/mL      Assessment & Plan:   Problem List Items Addressed This Visit      Other   Morbid obesity with BMI of 40.0-44.9, adult (HCC) - Primary   Relevant Medications   naproxen (NAPROSYN) 500 MG tablet    Other Visit Diagnoses    Right foot pain       Relevant Medications   naproxen (NAPROSYN) 500 MG tablet   Acute right ankle pain       Relevant Medications   naproxen (NAPROSYN) 500 MG tablet    Acute right foot and medial ankle pain with unknown etiology and no known trauma.  Considering PVD, plantar fasciitis, vs superficial DVT.  Fracture unlikely with clinical exam and lack of ecchymosis. Exam not  consistent with DVT and Well's DVT  score 0.  Complicated by pre-existing venous insufficiency and morbid obesity.  Plan: 1. START naproxen 500 mg bid x 14 days. 2. RICE reviewed. 3. Consider podiatry referral vs vascular referral after initial treatment.  Also consider prednisone. 4. Reviewed emergency s/sx.  Followup if no improvement or worsening for additional treatment.     Meds ordered this encounter  Medications  . naproxen (NAPROSYN) 500 MG tablet    Sig: Take 1 tablet (500 mg total) by mouth 2 (two) times daily with a meal for 14 days.    Dispense:  28 tablet    Refill:  0    Order Specific Question:   Supervising Provider    Answer:   Smitty Cords [[id number]]    Follow up plan: Return 7-10 days if symptoms worsen or fail to improve.  [name], DNP, AGPCNP-BC Adult Gerontology Primary Care Nurse Practitioner [facility] 12/31/2017, 5:14 PM

## 2017-12-31 NOTE — Patient Instructions (Addendum)
Jody Lee,   Thank you for coming in to clinic today.  1. Compression socks:  Measure for size.  Supply knee high socks/stockings for pressure of 25-30 mmHg  [facility] [address] [place], [place] [address] [phone]   2. START naproxen 500 mg twice daily  Go to ER: If swelling worsens and does not resolve by morning, if leg becomes red and warm.   Please schedule a follow-up appointment with [name], AGNP. Return 7-10 days if symptoms worsen or fail to improve.   Call if symptoms worsen.  If you have any other questions or concerns, please feel free to call the clinic or send a message through MyChart. You may also schedule an earlier appointment if necessary.  You will receive a survey after today's visit either digitally by e-mail or paper by [employer]. Your experiences and feedback matter to [place].  Please respond so we know how we are doing as we provide care for you.   [name], DNP, AGNP-BC Adult Gerontology Nurse Practitioner [facility], [facility]

## 2018-01-01 ENCOUNTER — Other Ambulatory Visit: Payer: Self-pay | Admitting: Nurse Practitioner

## 2018-01-01 DIAGNOSIS — B029 Zoster without complications: Secondary | ICD-10-CM

## 2018-01-02 ENCOUNTER — Encounter: Payer: Self-pay | Admitting: Nurse Practitioner

## 2018-01-02 DIAGNOSIS — M7989 Other specified soft tissue disorders: Secondary | ICD-10-CM

## 2018-01-02 DIAGNOSIS — M79671 Pain in right foot: Secondary | ICD-10-CM

## 2018-01-04 ENCOUNTER — Telehealth: Payer: Self-pay | Admitting: *Deleted

## 2018-01-04 NOTE — Telephone Encounter (Signed)
APPROVED  Request Reference Number: [id number]. MODAFINIL TAB 200MG  is approved through 07/07/2018. For further questions, call [phone].

## 2018-01-04 NOTE — Telephone Encounter (Signed)
PA for Modafinil 200mg  tablets #30/30 completed via Cover My Meds, Key# [id number].  Dx: Fatigue related to MS (R53.83, G35).  Continuation of therapy. Start date Nov. 2015/fim

## 2018-01-04 NOTE — Telephone Encounter (Signed)
Noted/fim 

## 2018-01-11 ENCOUNTER — Ambulatory Visit
Admission: RE | Admit: 2018-01-11 | Discharge: 2018-01-11 | Disposition: A | Discharge status: Home / Self Care | Payer: 59 | Source: Ambulatory Visit | Attending: Nurse Practitioner | Admitting: Nurse Practitioner

## 2018-01-11 ENCOUNTER — Other Ambulatory Visit: Payer: Self-pay | Admitting: Nurse Practitioner

## 2018-01-11 DIAGNOSIS — M25571 Pain in right ankle and joints of right foot: Secondary | ICD-10-CM

## 2018-01-11 DIAGNOSIS — M79671 Pain in right foot: Secondary | ICD-10-CM

## 2018-01-11 DIAGNOSIS — Z6841 Body Mass Index (BMI) 40.0 and over, adult: Principal | ICD-10-CM

## 2018-01-11 DIAGNOSIS — M7989 Other specified soft tissue disorders: Secondary | ICD-10-CM | POA: Insufficient documentation

## 2018-01-14 ENCOUNTER — Inpatient Hospital Stay: Payer: 59 | Attending: Hematology and Oncology

## 2018-01-14 DIAGNOSIS — D509 Iron deficiency anemia, unspecified: Secondary | ICD-10-CM | POA: Insufficient documentation

## 2018-01-14 DIAGNOSIS — E538 Deficiency of other specified B group vitamins: Secondary | ICD-10-CM | POA: Diagnosis not present

## 2018-01-14 DIAGNOSIS — D508 Other iron deficiency anemias: Secondary | ICD-10-CM

## 2018-01-14 LAB — CBC WITH DIFFERENTIAL/PLATELET
Basophils Absolute: 0.1 10*3/uL (ref 0–0.1)
Basophils Relative: 1 %
Eosinophils Absolute: 0 10*3/uL (ref 0–0.7)
Eosinophils Relative: 0 %
HCT: 30.7 % — ABNORMAL LOW (ref 35.0–47.0)
Hemoglobin: 10.3 g/dL — ABNORMAL LOW (ref 12.0–16.0)
Lymphocytes Relative: 17 %
Lymphs Abs: 1.1 10*3/uL (ref 1.0–3.6)
MCH: 29.7 pg (ref 26.0–34.0)
MCHC: 33.5 g/dL (ref 32.0–36.0)
MCV: 88.7 fL (ref 80.0–100.0)
Monocytes Absolute: 0.5 10*3/uL (ref 0.2–0.9)
Monocytes Relative: 8 %
Neutro Abs: 5 10*3/uL (ref 1.4–6.5)
Neutrophils Relative %: 74 %
Platelets: 273 10*3/uL (ref 150–440)
RBC: 3.46 MIL/uL — ABNORMAL LOW (ref 3.80–5.20)
RDW: 14.6 % — ABNORMAL HIGH (ref 11.5–14.5)
WBC: 6.8 10*3/uL (ref 3.6–11.0)

## 2018-01-14 LAB — FERRITIN: Ferritin: 114 ng/mL (ref 11–307)

## 2018-01-16 NOTE — Progress Notes (Signed)
[facility]-  Cancer Center  Clinic day:  01/17/18   Chief Complaint: Jody Lee is a 47 y.o. female status post gastric bypass surgery and subsequent iron deficiency and B12 deficiency who is seen for 6 month assessment.  HPI: The patient was last seen in the medical oncology clinic on 07/12/2017.  At that time, she complained of fatigued. Recent MRI had revealed 2 new spots on her brain (has MS diagnosis). She was changing from [place] to [place]; in the midst of a 60 day washout. Recent inconclusive TB testing. CXR showed a possible pulmonary nodule. She was scheduled for CT imaging on 07/13/2017. Exam revealed no palpable adenopathy or hepatosplenomegaly.  Hematocrit was 39.7 and hemoglobin 13.1.  Ferritin was 103.  CT imaging of the chest with contrast on 07/13/2017 was grossly normal.  No pulmonary nodules were observed.   She received monthly B12 injections (08/20/2017, 09/17/2017, 10/15/2017, and 11/19/2017).  Patient contacted her PCP on 07/22/2017 with complaints of cough and congestion. Treatment was began for a URI. She was treated with multiple rounds of antibiotics (Amoxil and Augmentin). She was seen in clinic on 08/09/2017 and diagnosed with CAP. She was treated with IM ceftriaxone in the office. Patient was sent home on a course of azithromycin, in addition to an Albuterol MDI and benzonatate. Patient continued to be symptomatic on 08/16/2017. She was prescribed another course of azithromycin, in addition to a 7 day course of Augmentin XR.  Patient finally began feeling better on 08/24/2017.    Following an extended respiratory illness, patient began Ocrevus infusions on 08/25/2017.  Routine labs on 10/15/2017 by her PCP revealed a WBC 6700 (ANC 5079). Hemoglobin was 13.1, hematocrit 40.2, MCV 89.7, and platelets 289,000. CMP, TSH, Hgb A1c all within normal limits. Ferritin was normal at 62. Vitamin D level low at 8 (30-100 ng/mL). She was started on vitamin  D supplementation.  CBC on 01/14/2018 revealed a WBC of 6800 (ANC 5000). Hemoglobin was 10.3, hematocrit 30.7, MCV 88.7, and platelets 273,000. Ferritin was 114.   In the interim, patient experiences daily heartburn and muscle cramps. Symptoms attributed to the Ocrevus infusions. Patient notes that her body feels "heavy" when she wakes up. Patient continues to experience daily mental and physical fatigue. She notes that she spends a lot of time sleeping. She spent most of this past weekend in bed. Patient has Provigil to use PRN for cognitive fatigue.   Patient denies that she has experienced any B symptoms. Patient denies bleeding; no hematochezia, melena, or gross hematuria. Patient denies chest pain and shortness of breath. No recent fevers or infections. Patient maintains an adequate appetite, and notes that she is eating well. Weight, compared to her last visit to the clinic, has increased by 15 pounds. She is taking DoTerra vitamin blends twice a day.   Patient denies pain in the clinic today.   Past Medical History:  Diagnosis Date  . Anemia   . Hypertension   . Menorrhagia   . MS (multiple sclerosis) (HCC)   . Shingles    right side of chest and back  . Vision abnormalities   . Vitamin B 12 deficiency   . Vitamin D deficiency     Past Surgical History:  Procedure Laterality Date  . CHOLECYSTECTOMY    . GASTRIC BYPASS      Family History  Problem Relation Age of Onset  . Stroke Mother   . Heart disease Mother   . Asthma Mother   .  Diabetes Mother   . Hypertension Father   . Heart disease Father   . Diabetes Father   . Asthma Brother   . Alcoholism Brother   . Obesity Brother   . Heart disease Brother   . Diabetes Maternal Grandmother   . Lung cancer Maternal Grandfather   . Breast cancer Neg Hx     Social History:  reports that she has never smoked. She has never used smokeless tobacco. She reports that she does not drink alcohol or use drugs.  She recently  moved from an apartment into a house.  She got married in 11/2016.  The patient is alone today.  Allergies:  Allergies  Allergen Reactions  . Nsaids Other (See Comments)    Not supposed to take d/t bariatric surgery   . Ciprofloxacin Rash    Current Medications: Current Outpatient Medications  Medication Sig Dispense Refill  . acetaminophen (TYLENOL) 325 MG tablet Take 650 mg by mouth every 4 (four) hours as needed.    . baclofen (LIORESAL) 10 MG tablet TAKE 1 TABLET(10 MG) BY MOUTH THREE TIMES DAILY 90 tablet 5  . gabapentin (NEURONTIN) 100 MG capsule TAKE 1 TO 2 CAPSULES(100 TO 200 MG) BY MOUTH THREE TIMES DAILY 120 capsule 0  . hydrochlorothiazide (MICROZIDE) 12.5 MG capsule Take 1 capsule (12.5 mg total) by mouth daily. 90 capsule 3  . lisinopril (PRINIVIL,ZESTRIL) 40 MG tablet TAKE 1 TABLET DAILY 90 tablet 3  . loratadine (CLARITIN) 10 MG tablet Take 10 mg by mouth daily.    . MedroxyPROGESTERone Acetate 150 MG/ML SUSY INJECT 1 ML (150 MG) INTRAMUSCULARLY EVERY 3 MONTHS 1 mL 3  . modafinil (PROVIGIL) 200 MG tablet TAKE 1 TABLET BY MOUTH DAILY 30 tablet 5  . naproxen (NAPROSYN) 500 MG tablet Take 1 tablet (500 mg total) by mouth 2 (two) times daily with a meal. 60 tablet 0  . ocrelizumab 600 mg in sodium chloride 0.9 % 500 mL Inject 600 mg into the vein once.    Marland Kitchen omeprazole (PRILOSEC) 20 MG capsule Take 20 mg by mouth 2 (two) times daily as needed.    Marland Kitchen PROAIR HFA 108 (90 Base) MCG/ACT inhaler      No current facility-administered medications for this visit.    Facility-Administered Medications Ordered in Other Visits  Medication Dose Route Frequency Provider Last Rate Last Dose  . gadopentetate dimeglumine (MAGNEVIST) injection 20 mL  20 mL Intravenous Once PRN Sater, Pearletha Furl, MD        Review of Systems  Constitutional: Positive for malaise/fatigue. Negative for diaphoresis, fever and weight loss (weight up 15 pounds).       Mentally and physically tired.  HENT: Negative.   Negative for congestion, nosebleeds, sinus pain and sore throat.   Eyes: Negative.  Negative for double vision, photophobia, pain and discharge.  Respiratory: Negative.  Negative for cough, hemoptysis, sputum production and shortness of breath.   Cardiovascular: Negative.  Negative for chest pain, palpitations, orthopnea, leg swelling and PND.  Gastrointestinal: Positive for heartburn (on PPI therapy). Negative for abdominal pain, blood in stool, constipation, diarrhea, melena, nausea and vomiting.  Genitourinary: Negative.  Negative for dysuria, frequency, hematuria and urgency.  Musculoskeletal: Positive for joint pain (RIGHT foot issues recently due to change in footware.) and myalgias (lower extremities tender to touch). Negative for back pain and falls.  Skin: Negative for itching and rash.  Neurological: Negative.  Negative for dizziness, tremors, weakness and headaches.       Multiple sclerosis;  on Ocrevus  Endo/Heme/Allergies: Negative.  Does not bruise/bleed easily.  Psychiatric/Behavioral: Negative for depression, memory loss and suicidal ideas. The patient is not nervous/anxious and does not have insomnia.        Mental fatigue. HYPERsomnia.  All other systems reviewed and are negative.  Performance status (ECOG): 1 - Symptomatic but completely ambulatory   Physical Exam: Blood pressure 109/74, pulse 67, temperature (!) 97 F (36.1 C), temperature source Tympanic, resp. rate 18, height 5\' 4"  (1.626 m), weight 293 lb (132.9 kg). GENERAL:  Well developed, well nourished, heavyset woman sitting comfortably in the exam room in no acute distress. MENTAL STATUS:  Alert and oriented to person, place and time. HEAD:  Long brown hair pulled back.  Normocephalic, atraumatic, face symmetric, no Cushingoid features. EYES:  Glasses.  Blue eyes.  Pupils equal round and reactive to light and accomodation.  No conjunctivitis or scleral icterus. ENT:  Oropharynx clear without lesion.  Tongue  normal.  Dentures.  Mucous membranes moist.  RESPIRATORY:  Clear to auscultation without rales, wheezes or rhonchi. CARDIOVASCULAR:  Regular rate and rhythm without murmur, rub or gallop. ABDOMEN:  Soft, non-tender, with active bowel sounds, and no hepatosplenomegaly.  No masses. SKIN:  Abdominal striae.  No rashes, ulcers or lesions. EXTREMITIES: No edema, no skin discoloration or tenderness.  No palpable cords. LYMPH NODES: No palpable cervical, supraclavicular, axillary or inguinal adenopathy  NEUROLOGICAL: Unremarkable. PSYCH:  Appropriate.    No visits with results within 3 Day(s) from this visit.  Latest known visit with results is:  Appointment on 01/14/2018  Component Date Value Ref Range Status  . WBC 01/14/2018 6.8  3.6 - 11.0 K/uL Final  . RBC 01/14/2018 3.46* 3.80 - 5.20 MIL/uL Final  . Hemoglobin 01/14/2018 10.3* 12.0 - 16.0 g/dL Final  . HCT 44/92/0100 30.7* 35.0 - 47.0 % Final  . MCV 01/14/2018 88.7  80.0 - 100.0 fL Final  . MCH 01/14/2018 29.7  26.0 - 34.0 pg Final  . MCHC 01/14/2018 33.5  32.0 - 36.0 g/dL Final  . RDW 71/21/9758 14.6* 11.5 - 14.5 % Final  . Platelets 01/14/2018 273  150 - 440 K/uL Final  . Neutrophils Relative % 01/14/2018 74  % Final  . Neutro Abs 01/14/2018 5.0  1.4 - 6.5 K/uL Final  . Lymphocytes Relative 01/14/2018 17  % Final  . Lymphs Abs 01/14/2018 1.1  1.0 - 3.6 K/uL Final  . Monocytes Relative 01/14/2018 8  % Final  . Monocytes Absolute 01/14/2018 0.5  0.2 - 0.9 K/uL Final  . Eosinophils Relative 01/14/2018 0  % Final  . Eosinophils Absolute 01/14/2018 0.0  0 - 0.7 K/uL Final  . Basophils Relative 01/14/2018 1  % Final  . Basophils Absolute 01/14/2018 0.1  0 - 0.1 K/uL Final   Performed at [facility], [address], [place], [place] [address]  . Ferritin 01/14/2018 114  11 - 307 ng/mL Final   Performed at [facility], [address] [place] [place], [place] [address]    Assessment:  Jody Lee is a 47 y.o. female   status post gastric bypass surgery (2003) with resultant iron deficiency anemia and B12 deficiency.  She is intolerant of oral iron.   She has iron deficiency.  She receives Venofer when her ferritin is < 30 (last 08/21/2016).  Ferritin has been followed:  62 on 01/04/2015, 73 on 02/22/2015, 40 on 03/25/2015, 10 on 05/28/2015, 61 on 08/21/2015, 20 on 11/12/2015, 56 on 02/12/2016, 33 on 03/27/2016,  79 on 05/20/2016, 45 on 06/26/2016, 28 on 08/20/2016, 50 on 01/08/2017, 103 on 07/12/2017, 62 on 10/15/2017, and 114 on 01/14/2018.   She has B12 deficiency.  She was initially treated with B12 IM, but has subsequently been on oral B12.  B12 was sub-therapeutic on 06/27/2014.  She received weekly B12 x 6 (01/04/2015-02/22/2015) followed by monthly B12 (last 11/19/2017).  Folic acid was normal (12.5) on 01/25/2015.  She takes daily folic acid.    She has multiple sclerosis diagnosis, and was previously treated with [place] (fingolimod). There is a 4-7% risk of lymphopenia.  There is a < 1%, post marketing/case reports of lymphoma.  She began Ocrevus infusions on 08/25/2017.  She had shingles in 11/2016.  Symptomatically, she is fatigued.  Exam reveals no palpable adenopathy or hepatosplenomegaly.  Hematocrit is 30.7 and hemoglobin 10.3.  Ferritin is 114.  Plan: 1. Review labs from 01/14/2018. Hemoglobin 10.3, hematocrit 30.7, and MCV 88.7.    2. Discuss drop in hemoglobin from 13.1 to 10.3 in past 3 months.  Etiology unclear.  Check CMP, iron studies, ferritin, sed rate, retic, folate, TSH. 3. Discuss iron stores. Ferritin appears normal at 114.  Ferritin goal 100.  No IV iron needed today.  4. B12 injection today, then continue monthly as previously ordered.  5. Discuss MS diagnosis. Patient follows up on a regular basis with neurology for monitoring and Ocrevus infusions. Continue as already scheduled.  6. RTC in 1 month for labs (CBC with diff) 7. RTC in 6 months for MD assessment, labs (CBC with diff,  ferritin,sed rate- day before) + B12   Quentin Mulling, NP  01/17/2018, 2:05 PM   I saw and evaluated the patient, participating in the key portions of the service and reviewing pertinent diagnostic studies and records.  I reviewed the nurse practitioner's note and agree with the findings and the plan.  The assessment and plan were discussed with the patient.  A few questions were asked by the patient and answered.   Nelva Nay, MD 01/17/2018,2:05 PM

## 2018-01-17 ENCOUNTER — Other Ambulatory Visit: Payer: Self-pay

## 2018-01-17 ENCOUNTER — Inpatient Hospital Stay: Payer: 59

## 2018-01-17 ENCOUNTER — Encounter: Payer: Self-pay | Admitting: Hematology and Oncology

## 2018-01-17 ENCOUNTER — Inpatient Hospital Stay (HOSPITAL_BASED_OUTPATIENT_CLINIC_OR_DEPARTMENT_OTHER): Payer: 59 | Admitting: Hematology and Oncology

## 2018-01-17 VITALS — BP 109/74 | HR 67 | Temp 97.0°F | Resp 18 | Ht 64.0 in | Wt 293.0 lb

## 2018-01-17 DIAGNOSIS — G35 Multiple sclerosis: Secondary | ICD-10-CM | POA: Diagnosis not present

## 2018-01-17 DIAGNOSIS — D509 Iron deficiency anemia, unspecified: Secondary | ICD-10-CM

## 2018-01-17 DIAGNOSIS — D508 Other iron deficiency anemias: Secondary | ICD-10-CM

## 2018-01-17 DIAGNOSIS — E538 Deficiency of other specified B group vitamins: Secondary | ICD-10-CM

## 2018-01-17 LAB — COMPREHENSIVE METABOLIC PANEL
ALBUMIN: 3.9 g/dL (ref 3.5–5.0)
ALT: 14 U/L (ref 14–54)
ANION GAP: 7 (ref 5–15)
AST: 17 U/L (ref 15–41)
Alkaline Phosphatase: 82 U/L (ref 38–126)
BUN: 25 mg/dL — ABNORMAL HIGH (ref 6–20)
CALCIUM: 9.2 mg/dL (ref 8.9–10.3)
CHLORIDE: 110 mmol/L (ref 101–111)
CO2: 23 mmol/L (ref 22–32)
Creatinine, Ser: 1.43 mg/dL — ABNORMAL HIGH (ref 0.44–1.00)
GFR calc non Af Amer: 43 mL/min — ABNORMAL LOW (ref >60)
GFR, EST AFRICAN AMERICAN: 50 mL/min — AB (ref >60)
GLUCOSE: 107 mg/dL — AB (ref 65–99)
POTASSIUM: 4.8 mmol/L (ref 3.5–5.1)
SODIUM: 140 mmol/L (ref 135–145)
Total Bilirubin: 0.7 mg/dL (ref 0.3–1.2)
Total Protein: 6.8 g/dL (ref 6.5–8.1)

## 2018-01-17 LAB — FERRITIN: FERRITIN: 144 ng/mL (ref 11–307)

## 2018-01-17 LAB — IRON AND TIBC
IRON: 53 ug/dL (ref 28–170)
SATURATION RATIOS: 19 % (ref 10.4–31.8)
TIBC: 284 ug/dL (ref 250–450)
UIBC: 231 ug/dL

## 2018-01-17 LAB — TSH: TSH: 1.91 u[IU]/mL (ref 0.350–4.500)

## 2018-01-17 LAB — RETICULOCYTES
RBC.: 3.61 MIL/uL — ABNORMAL LOW (ref 3.80–5.20)
RETIC CT PCT: 1.1 % (ref 0.4–3.1)
Retic Count, Absolute: 39.7 10*3/uL (ref 19.0–183.0)

## 2018-01-17 LAB — FOLATE: Folate: 21 ng/mL (ref >5.9)

## 2018-01-17 LAB — SEDIMENTATION RATE: Sed Rate: 33 mm/hr — ABNORMAL HIGH (ref 0–20)

## 2018-01-17 MED ORDER — CYANOCOBALAMIN 1000 MCG/ML IJ SOLN
1000.0000 ug | Freq: Once | INTRAMUSCULAR | Status: AC
  Administered 2018-01-17: 1000 ug via INTRAMUSCULAR
  Filled 2018-01-17: qty 1

## 2018-01-22 ENCOUNTER — Other Ambulatory Visit: Payer: Self-pay | Admitting: Nurse Practitioner

## 2018-01-22 DIAGNOSIS — J189 Pneumonia, unspecified organism: Secondary | ICD-10-CM

## 2018-01-24 ENCOUNTER — Other Ambulatory Visit: Payer: Self-pay | Admitting: Nurse Practitioner

## 2018-01-24 DIAGNOSIS — B029 Zoster without complications: Secondary | ICD-10-CM

## 2018-02-04 ENCOUNTER — Ambulatory Visit: Payer: 59 | Admitting: Nurse Practitioner

## 2018-02-08 ENCOUNTER — Other Ambulatory Visit: Payer: Self-pay | Admitting: Nurse Practitioner

## 2018-02-08 DIAGNOSIS — Z6841 Body Mass Index (BMI) 40.0 and over, adult: Principal | ICD-10-CM

## 2018-02-08 DIAGNOSIS — M25571 Pain in right ankle and joints of right foot: Secondary | ICD-10-CM

## 2018-02-08 DIAGNOSIS — M79671 Pain in right foot: Secondary | ICD-10-CM

## 2018-02-11 ENCOUNTER — Other Ambulatory Visit: Payer: Self-pay | Admitting: Family Medicine

## 2018-02-11 DIAGNOSIS — B029 Zoster without complications: Secondary | ICD-10-CM

## 2018-02-14 ENCOUNTER — Ambulatory Visit: Payer: 59

## 2018-02-16 ENCOUNTER — Inpatient Hospital Stay: Payer: 59 | Attending: Hematology and Oncology

## 2018-02-16 ENCOUNTER — Inpatient Hospital Stay: Payer: 59

## 2018-02-16 ENCOUNTER — Other Ambulatory Visit: Payer: Self-pay | Admitting: Nurse Practitioner

## 2018-02-16 ENCOUNTER — Ambulatory Visit: Payer: 59 | Admitting: Neurology

## 2018-02-16 DIAGNOSIS — D508 Other iron deficiency anemias: Secondary | ICD-10-CM

## 2018-02-16 DIAGNOSIS — R0602 Shortness of breath: Secondary | ICD-10-CM

## 2018-02-16 DIAGNOSIS — E538 Deficiency of other specified B group vitamins: Secondary | ICD-10-CM | POA: Diagnosis present

## 2018-02-16 DIAGNOSIS — J189 Pneumonia, unspecified organism: Secondary | ICD-10-CM

## 2018-02-16 LAB — CBC WITH DIFFERENTIAL/PLATELET
BASOS ABS: 0 10*3/uL (ref 0–0.1)
Basophils Relative: 1 %
Eosinophils Absolute: 0 10*3/uL (ref 0–0.7)
Eosinophils Relative: 0 %
HEMATOCRIT: 33.7 % — AB (ref 35.0–47.0)
Hemoglobin: 11.3 g/dL — ABNORMAL LOW (ref 12.0–16.0)
LYMPHS ABS: 0.7 10*3/uL — AB (ref 1.0–3.6)
LYMPHS PCT: 12 %
MCH: 30.3 pg (ref 26.0–34.0)
MCHC: 33.5 g/dL (ref 32.0–36.0)
MCV: 90.4 fL (ref 80.0–100.0)
MONOS PCT: 10 %
Monocytes Absolute: 0.6 10*3/uL (ref 0.2–0.9)
NEUTROS ABS: 4.6 10*3/uL (ref 1.4–6.5)
Neutrophils Relative %: 77 %
Platelets: 268 10*3/uL (ref 150–440)
RBC: 3.73 MIL/uL — ABNORMAL LOW (ref 3.80–5.20)
RDW: 14.2 % (ref 11.5–14.5)
WBC: 5.9 10*3/uL (ref 3.6–11.0)

## 2018-02-16 MED ORDER — CYANOCOBALAMIN 1000 MCG/ML IJ SOLN
1000.0000 ug | Freq: Once | INTRAMUSCULAR | Status: AC
  Administered 2018-02-16: 1000 ug via INTRAMUSCULAR

## 2018-02-16 NOTE — Telephone Encounter (Signed)
If shortness of breath continues regularly, patient will benefit from maintenance inhaler and PFT.  Monitor for worsening symptoms at next visit or within 3 months.

## 2018-02-21 ENCOUNTER — Ambulatory Visit (INDEPENDENT_AMBULATORY_CARE_PROVIDER_SITE_OTHER): Payer: 59

## 2018-02-21 DIAGNOSIS — Z3202 Encounter for pregnancy test, result negative: Secondary | ICD-10-CM | POA: Diagnosis not present

## 2018-02-21 DIAGNOSIS — Z308 Encounter for other contraceptive management: Secondary | ICD-10-CM

## 2018-02-21 DIAGNOSIS — Z3042 Encounter for surveillance of injectable contraceptive: Secondary | ICD-10-CM | POA: Diagnosis not present

## 2018-02-21 LAB — POCT URINE PREGNANCY: Preg Test, Ur: NEGATIVE

## 2018-02-21 MED ORDER — MEDROXYPROGESTERONE ACETATE 150 MG/ML IM SUSP
150.0000 mg | Freq: Once | INTRAMUSCULAR | Status: AC
  Administered 2018-02-21: 150 mg via INTRAMUSCULAR

## 2018-02-21 NOTE — Progress Notes (Signed)
Pt here for depo which was given IM right glut after obtaining negative urine preg test.  NDC# [address]-2 

## 2018-03-14 ENCOUNTER — Inpatient Hospital Stay: Payer: 59 | Attending: Hematology and Oncology

## 2018-03-18 DIAGNOSIS — G35 Multiple sclerosis: Secondary | ICD-10-CM | POA: Diagnosis not present

## 2018-03-23 ENCOUNTER — Ambulatory Visit: Payer: 59 | Admitting: Neurology

## 2018-03-24 ENCOUNTER — Telehealth: Payer: Self-pay | Admitting: Neurology

## 2018-03-24 ENCOUNTER — Other Ambulatory Visit: Payer: Self-pay

## 2018-03-24 ENCOUNTER — Encounter: Payer: Self-pay | Admitting: Neurology

## 2018-03-24 ENCOUNTER — Ambulatory Visit (INDEPENDENT_AMBULATORY_CARE_PROVIDER_SITE_OTHER): Payer: 59 | Admitting: Neurology

## 2018-03-24 ENCOUNTER — Ambulatory Visit: Payer: 59 | Admitting: Neurology

## 2018-03-24 VITALS — BP 156/85 | HR 80 | Resp 18 | Ht 64.0 in | Wt 290.5 lb

## 2018-03-24 DIAGNOSIS — Z79899 Other long term (current) drug therapy: Secondary | ICD-10-CM | POA: Diagnosis not present

## 2018-03-24 DIAGNOSIS — F418 Other specified anxiety disorders: Secondary | ICD-10-CM | POA: Diagnosis not present

## 2018-03-24 DIAGNOSIS — R5383 Other fatigue: Secondary | ICD-10-CM | POA: Diagnosis not present

## 2018-03-24 DIAGNOSIS — G35 Multiple sclerosis: Secondary | ICD-10-CM | POA: Diagnosis not present

## 2018-03-24 MED ORDER — ESCITALOPRAM OXALATE 10 MG PO TABS: 10.0000 mg | ORAL_TABLET | Freq: Every day | ORAL | 11 refills | Status: DC

## 2018-03-24 NOTE — Telephone Encounter (Signed)
[employer] Auth: [id number] (exp. 03/24/18 to 05/08/18)   Patient is scheduled at [facility] for 04/05/18 arrival time is 4:30 pm. I left a voicemail with this information and let their number of [phone] if for some reason she needed to r/s.

## 2018-03-24 NOTE — Progress Notes (Signed)
[facility]  PATIENT: Jody Lee DOB: [birth year]  REFERRING DOCTOR OR PCP:  Venora Maples SOURCE: patient and labs, notes in EMR and MRI on PACS  _________________________________   HISTORICAL  CHIEF COMPLAINT:  Chief Complaint  Patient presents with  . Multiple Sclerosis    Sts. she is tolerating Ocrevus well.  Last infusion (600mg )  was 03/18/18.  She is tearful today--her mother passed away 2 wks ago following surgery to remove and replace an artificial heart valve. She would like to discuss starting an antidepressant/fim    HISTORY OF PRESENT ILLNESS:  Jody Lee is a 47 y.o. woman with MS diagnosed in 2005.     Update 03/24/2018: She had Ocrevus 1st course in February 2019 and just had her second dose 03/18/2018.   She slept through the infusion.      She tolerated it well and she has not had any exacerbation since starting.      Physically she feels baseline.     She is walking well.   If very tired the right foot drops some.    Strength is mildly reduced right worse than left.   Her  grips are mildly weaker.   She denies numbness or dysesthesia.    Vision is ok.     She has left astigmatism.   Colors are mildly desaturated on left.     Bladder is fine.   She is depressed and stressed with the recent death oif her mother (had heart valve surgery).   She would like to be on an antidepressant.   She has never been on one on the past.     She is tearful, even at work.     While her mom ws in the hospital she was physically and mentally very fatigued (she was hospitalized x 8 weeks).   Before her mom's long hospital stay, fatigue was mostly mild.    Cognition is ok with mikd atentional issues.      Update 09/24/2017: She completed the first 2 halves of the first ocrelizumab infusion last week. She did have some nausea and heartburn the day of the infusion an next day but otherwise felt fine.    Heartburn improved with Zantac.  She denies any new symptoms.    The hand tingling is resolved.      No new weakness.   Her gait is better but will occ stumble.     She can walk a mile.   Vision seems to be stable.   She did need a new prescription.  She has diarrhea since gastric bypass surgery.  She also has anemia and had B12 deficiency.  She has urinary frequency, unchanged in severity.   Modafinil has helped her fatigue and attentional issues.   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   Mood is doing well.    She sleeps well most nights.    She feels cognition is back to baseline.    Update 06/24/2017:   I personally reviewed the results and images of her 06/15/2017 MRI.  She has one enhancing lesion on the MRI of the brain and also has a new lesion that is nonenhancing in the cervical spine.  She notes more muscle stiffness and more right hand tingling the past 2 months.    In detail, we discussed several options for treatment due to her breakthrough while on Gilenya. Specifically we went over the risks and benefits of Loanne Drilling  and ocrelizumab. We will check blood work to determine if any of these are contraindicated. She is most interested in either Tysabri or ocrelizumab   Update 05/07/2017: She is on Gilenya for her relapsing multiple sclerosis. She tolerates it well. She has not had any definite exacerbation recently, though one week ago she had one day where she was dizzy the symptoms have since resolved.   When she had the vertigo, she felt her gait was tilted to the one side.     For the most part, gait does well but the right foot drags sometimes.     She denies significant numbness or weakness,    She needs stronger glasses and one day she felt her vision was cloudy for several hours.   She has some urinary frequency and notes bladder pain at times.      In general, fatigue is ok most days but she has occasional times it is worse.   She walks one mile daily as exercise.    She also had one week in August with  severe fatigue.  She felt back to baseline the next week.  Mood is doing well.     Provigil has helped her.     After the last visit, she developed shingles. She feels she has completely recovered from the shingles of the right thorax/axilla.    ______________________________ From 10/21/2016  MS:   She is on Gilenya and tolerates it well.     She has not had any definite exacerbation.   She has noted more rightleg spasms.   Spine MRI's showed several chronic plaques in her cervical spine and one in the thoracic spine.,    Gait/strength/sensation:  She sometimes stumbles due to her right foot being slightly weak.    Sometimes she veers while walking.  No falls.      She denies any major problem with weakness or numbness. She has more trouble using a can opener and occasionally is dropping items.     She gets right foot cramps, helped by baclofen..     Bladder: She notes worsening urinary frequency and urgency. She does not have incontinence.   She has fewer spasms.    Vision: She denies any MS related visual problems.   She has new glasses  Fatigue/sleep: She reports physical and cognitive fatigue. She has noted a benefit with Provigil. Adderall was not well tolerated.     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   She also takes a lot of caffeine on days that she needs to stay up longer or if she is out of her Provigil. She has some difficulty falling asleep but once asleep often stays asleep.    Often she has difficulty turning her mind off.   Her fianc has noted that she moans that her sleep sometimes. She has not had any screaming, kicking or punching.  Mood/cognition: She denies any major difficulty with depression or anxiety at this time. She notes some cognitive difficulty. This is mostly problems with focusing and distraction.  She feels cognition does better with Provigil as well .Marland Kitchen She strays mentally  active.  Other: Her anemia is doing better and she gets intermittent iron infusions and B12 monthly.   MS History:   She was diagnosed with MS in 2005 after presenting with a spinal cord syndrome. She started with numbness and pain in the feet that worked its  way up to her abdomen over a couple days.   Initially, she had an MRI of the spine that showed a plaque consistent with her symptoms. She was referred to Dr. Maple Hudson. He did a lumbar puncture and ordered an MRI of the brain. Both were consistent with multiple sclerosis and I started to see her. Initially, she was placed on Betaseron and did very well for the first few years. Then around 2011 or 2012, she had an exacerbation and testing showed that she had developed antibodies against Betaseron. Therefore, she was switched to Gilenya at that time. She has been on Gilenya for the last 4+ years and has done well. Specifically, she has not reported any difficulties with exacerbations and she tolerates the medication well.   Her last MRI was performed at [facility] November 2015.  MRI of the brain from 03/02/2013 showed  typical periventricular T2/FLAIR hyperintense foci, predominantly in the periventricular white matter.   There were no acute foci.   REVIEW OF SYSTEMS: Constitutional: No fevers, chills, sweats, or change in appetite.   She has fatigue. Eyes: No visual changes, double vision, eye pain Ear, nose and throat: No hearing loss, ear pain, nasal congestion, sore throat Cardiovascular: No chest pain, palpitations Respiratory: No shortness of breath at rest or with exertion.   No wheezes GastrointestinaI: No nausea, vomiting, diarrhea, abdominal pain, fecal incontinence Genitourinary: No dysuria, urinary retention or frequency.  No nocturia. Musculoskeletal: No neck pain, back pain Integumentary: No rash, pruritus, skin lesions Neurological: as above Psychiatric: There is depression and anxiety.  She is tearful at  times. Endocrine: No palpitations, diaphoresis, change in appetite, change in weigh or increased thirst Hematologic/Lymphatic: No anemia, purpura, petechiae. Allergic/Immunologic: No itchy/runny eyes, nasal congestion, recent allergic reactions, rashes  ALLERGIES: Allergies  Allergen Reactions  . Nsaids Other (See Comments)    Not supposed to take d/t bariatric surgery   . Ciprofloxacin Rash    HOME MEDICATIONS:  Current Outpatient Medications:  .  acetaminophen (TYLENOL) 325 MG tablet, Take 650 mg by mouth every 4 (four) hours as needed., Disp: , Rfl:  .  baclofen (LIORESAL) 10 MG tablet, TAKE 1 TABLET(10 MG) BY MOUTH THREE TIMES DAILY, Disp: 90 tablet, Rfl: 5 .  gabapentin (NEURONTIN) 100 MG capsule, Take 1 capsule (100 mg total) by mouth 3 (three) times daily., Disp: 90 capsule, Rfl: 2 .  hydrochlorothiazide (MICROZIDE) 12.5 MG capsule, Take 1 capsule (12.5 mg total) by mouth daily., Disp: 90 capsule, Rfl: 3 .  lisinopril (PRINIVIL,ZESTRIL) 40 MG tablet, TAKE 1 TABLET DAILY, Disp: 90 tablet, Rfl: 3 .  loratadine (CLARITIN) 10 MG tablet, Take 10 mg by mouth daily., Disp: , Rfl:  .  MedroxyPROGESTERone Acetate 150 MG/ML SUSY, INJECT 1 ML (150 MG) INTRAMUSCULARLY EVERY 3 MONTHS, Disp: 1 mL, Rfl: 3 .  modafinil (PROVIGIL) 200 MG tablet, TAKE 1 TABLET BY MOUTH DAILY, Disp: 30 tablet, Rfl: 5 .  ocrelizumab 600 mg in sodium chloride 0.9 % 500 mL, Inject 600 mg into the vein once., Disp: , Rfl:  .  omeprazole (PRILOSEC) 20 MG capsule, Take 20 mg by mouth 2 (two) times daily as needed., Disp: , Rfl:  .  escitalopram (LEXAPRO) 10 MG tablet, Take 1 tablet (10 mg total) by mouth daily., Disp: 30 tablet, Rfl: 11 No current facility-administered medications for this visit.   Facility-Administered Medications Ordered in Other Visits:  .  gadopentetate dimeglumine (MAGNEVIST) injection 20 mL, 20 mL, Intravenous, Once PRN, Derisha Funderburke, Pearletha Furl, MD  PAST MEDICAL HISTORY: Past Medical History:   Diagnosis Date  . Anemia   . Hypertension   . Menorrhagia   . MS (multiple sclerosis) (HCC)   . Shingles    right side of chest and back  . Vision abnormalities   . Vitamin B 12 deficiency   . Vitamin D deficiency     PAST SURGICAL HISTORY: Past Surgical History:  Procedure Laterality Date  . CHOLECYSTECTOMY    . GASTRIC BYPASS      FAMILY HISTORY: Family History  Problem Relation Age of Onset  . Stroke Mother   . Heart disease Mother   . Asthma Mother   . Diabetes Mother   . Hypertension Father   . Heart disease Father   . Diabetes Father   . Asthma Brother   . Alcoholism Brother   . Obesity Brother   . Heart disease Brother   . Diabetes Maternal Grandmother   . Lung cancer Maternal Grandfather   . Breast cancer Neg Hx     SOCIAL HISTORY:  Social History   Socioeconomic History  . Marital status: Single    Spouse name: Not on file  . Number of children: 1  . Years of education: Not on file  . Highest education level: Not on file  Occupational History  . Occupation: Medical sales representative  . Financial resource strain: Not on file  . Food insecurity:    Worry: Not on file    Inability: Not on file  . Transportation needs:    Medical: Not on file    Non-medical: Not on file  Tobacco Use  . Smoking status: Never Smoker  . Smokeless tobacco: Never Used  Substance and Sexual Activity  . Alcohol use: No  . Drug use: No  . Sexual activity: Yes    Birth control/protection: Injection  Lifestyle  . Physical activity:    Days per week: Not on file    Minutes per session: Not on file  . Stress: Not on file  Relationships  . Social connections:    Talks on phone: Not on file    Gets together: Not on file    Attends religious service: Not on file    Active member of club or organization: Not on file    Attends meetings of clubs or organizations: Not on file    Relationship status: Not on file  . Intimate partner violence:    Fear of current or ex  partner: Not on file    Emotionally abused: Not on file    Physically abused: Not on file    Forced sexual activity: Not on file  Other Topics Concern  . Not on file  Social History Narrative   Remarried in 2018. Has one daughter, one grandchild and 2 step children. Works from home     PHYSICAL EXAM  Vitals:   03/24/18 1000  BP: (!) 156/85  Pulse: 80  Resp: 18  Weight: 290 lb 8 oz (131.8 kg)  Height: 5\' 4"  (1.626 m)    Body mass index is 49.86 kg/m.   General: The patient is well-developed and well-nourished and in no acute distress   Neck: The neck has good ROM  Neurologic Exam  Mental status: The patient is alert and oriented x 3 at the time of the examination. The patient has apparent normal recent and remote memory, with an apparently normal attention span and concentration ability.   Speech is normal.  Cranial nerves: Extraocular muscles are normal.  Facial strength and sensation is normal.. Hearing is normal and symmetric. Trapezius strength is normal.   Motor:  Muscle bulk is normal.   Muscle tone is normal.  Strength is 5/5 in the arms and legs.  Sensory: Sensory testing is intact to touch and vibration sensation in all 4 extremities.  Coordination: Finger-nose-finger is performed well.Marland Kitchen Heel-to-shin is performed well.  Gait and station: Station is normal.  Her gait is normal.  Tandem gait is mildly wide.. Romberg is negative.   Reflexes: Deep tendon reflexes are symmetric and normal bilaterally.        DIAGNOSTIC DATA (LABS, IMAGING, TESTING) - I reviewed patient records, labs, notes, testing and imaging myself where available.  Lab Results  Component Value Date   WBC 5.9 02/16/2018   HGB 11.3 (L) 02/16/2018   HCT 33.7 (L) 02/16/2018   MCV 90.4 02/16/2018   PLT 268 02/16/2018      Component Value Date/Time   NA 140 01/17/2018 1453   NA 144 06/24/2017 1439   NA 139 06/27/2014 1356   K 4.8 01/17/2018 1453   K 3.9 06/27/2014 1356   CL 110  01/17/2018 1453   CL 106 06/27/2014 1356   CO2 23 01/17/2018 1453   CO2 28 06/27/2014 1356   GLUCOSE 107 (H) 01/17/2018 1453   GLUCOSE 89 06/27/2014 1356   BUN 25 (H) 01/17/2018 1453   BUN 12 06/24/2017 1439   BUN 7 06/27/2014 1356   CREATININE 1.43 (H) 01/17/2018 1453   CREATININE 0.90 10/15/2017 1034   CALCIUM 9.2 01/17/2018 1453   CALCIUM 8.3 (L) 06/27/2014 1356   PROT 6.8 01/17/2018 1453   PROT 6.5 06/24/2017 1439   PROT 6.9 06/27/2014 1356   ALBUMIN 3.9 01/17/2018 1453   ALBUMIN 4.3 06/24/2017 1439   ALBUMIN 3.8 06/27/2014 1356   AST 17 01/17/2018 1453   AST 15 06/27/2014 1356   ALT 14 01/17/2018 1453   ALT 20 06/27/2014 1356   ALKPHOS 82 01/17/2018 1453   ALKPHOS 105 06/27/2014 1356   BILITOT 0.7 01/17/2018 1453   BILITOT 0.2 06/24/2017 1439   BILITOT 0.4 06/27/2014 1356   GFRNONAA 43 (L) 01/17/2018 1453   GFRNONAA 77 10/15/2017 1034   GFRAA 50 (L) 01/17/2018 1453   GFRAA 89 10/15/2017 1034    Lab Results  Component Value Date   VITAMINB12 251 05/28/2015   Lab Results  Component Value Date   TSH 1.910 01/17/2018       ASSESSMENT AND PLAN  Other fatigue  Multiple sclerosis (HCC) - Plan: MR BRAIN W WO CONTRAST  High risk medication use  Depression with anxiety   1.    Continue Ocrevus.  She has had her second course and tolerated it fairly well.  I will have her come back to see me in the right before her dose in February and check blood work at that time for IgG/IgM and CD19/CD20.   2.   She can continue Provigil. 3,    Lexapro 10 mg daily for depression and anxiety related to recent death of her mother.  If she continues to have issues after a month, consider adding Abilify.   4.   She will return in 6 months or sooner if there are new or worsening neurologic symptoms.    Kesa Birky A. Epimenio Foot, MD, PhD 03/24/2018, 10:38 AM Certified in Neurology, Clinical Neurophysiology, Sleep Medicine, Pain Medicine and Neuroimaging  [facility] [address] [place], [place] [address] [phone]

## 2018-04-02 ENCOUNTER — Other Ambulatory Visit: Payer: Self-pay | Admitting: Neurology

## 2018-04-05 ENCOUNTER — Ambulatory Visit: Payer: 59

## 2018-04-12 ENCOUNTER — Inpatient Hospital Stay: Payer: 59 | Attending: Hematology and Oncology

## 2018-04-16 ENCOUNTER — Ambulatory Visit
Admission: RE | Admit: 2018-04-16 | Discharge: 2018-04-16 | Disposition: A | Discharge status: Home / Self Care | Payer: 59 | Source: Ambulatory Visit | Attending: Neurology | Admitting: Neurology

## 2018-04-16 DIAGNOSIS — G35 Multiple sclerosis: Secondary | ICD-10-CM | POA: Insufficient documentation

## 2018-04-16 MED ORDER — GADOBENATE DIMEGLUMINE 529 MG/ML IV SOLN
20.0000 mL | Freq: Once | INTRAVENOUS | Status: AC | PRN
  Administered 2018-04-16: 20 mL via INTRAVENOUS

## 2018-04-22 ENCOUNTER — Telehealth: Payer: Self-pay | Admitting: *Deleted

## 2018-04-22 NOTE — Telephone Encounter (Signed)
-----   Message from Asa Lente, MD sent at 04/22/2018 12:30 PM EDT ----- Please let her know that the MRI did not show any new lesions

## 2018-04-22 NOTE — Telephone Encounter (Signed)
Spoke with Jody Lee and reviewed below MRI results.  She verbalized understanding of same/fim

## 2018-04-25 LAB — POCT I-STAT CREATININE: CREATININE: 1.1 mg/dL — AB (ref 0.44–1.00)

## 2018-05-01 ENCOUNTER — Other Ambulatory Visit: Payer: Self-pay | Admitting: Neurology

## 2018-05-10 ENCOUNTER — Inpatient Hospital Stay: Payer: 59 | Attending: Hematology and Oncology

## 2018-05-16 ENCOUNTER — Ambulatory Visit: Payer: Self-pay

## 2018-06-05 ENCOUNTER — Other Ambulatory Visit: Payer: Self-pay | Admitting: Neurology

## 2018-06-05 IMAGING — DX DG FOOT COMPLETE 3+V*R*
3 series · 3 of 3 positions shown · non-contrast
Comparison: None.

CLINICAL DATA: RIGHT foot pain since 12/30/2017, without injury.
RIGHT foot swelling.

EXAM:
RIGHT FOOT COMPLETE - 3+ VIEW

[foot ap]
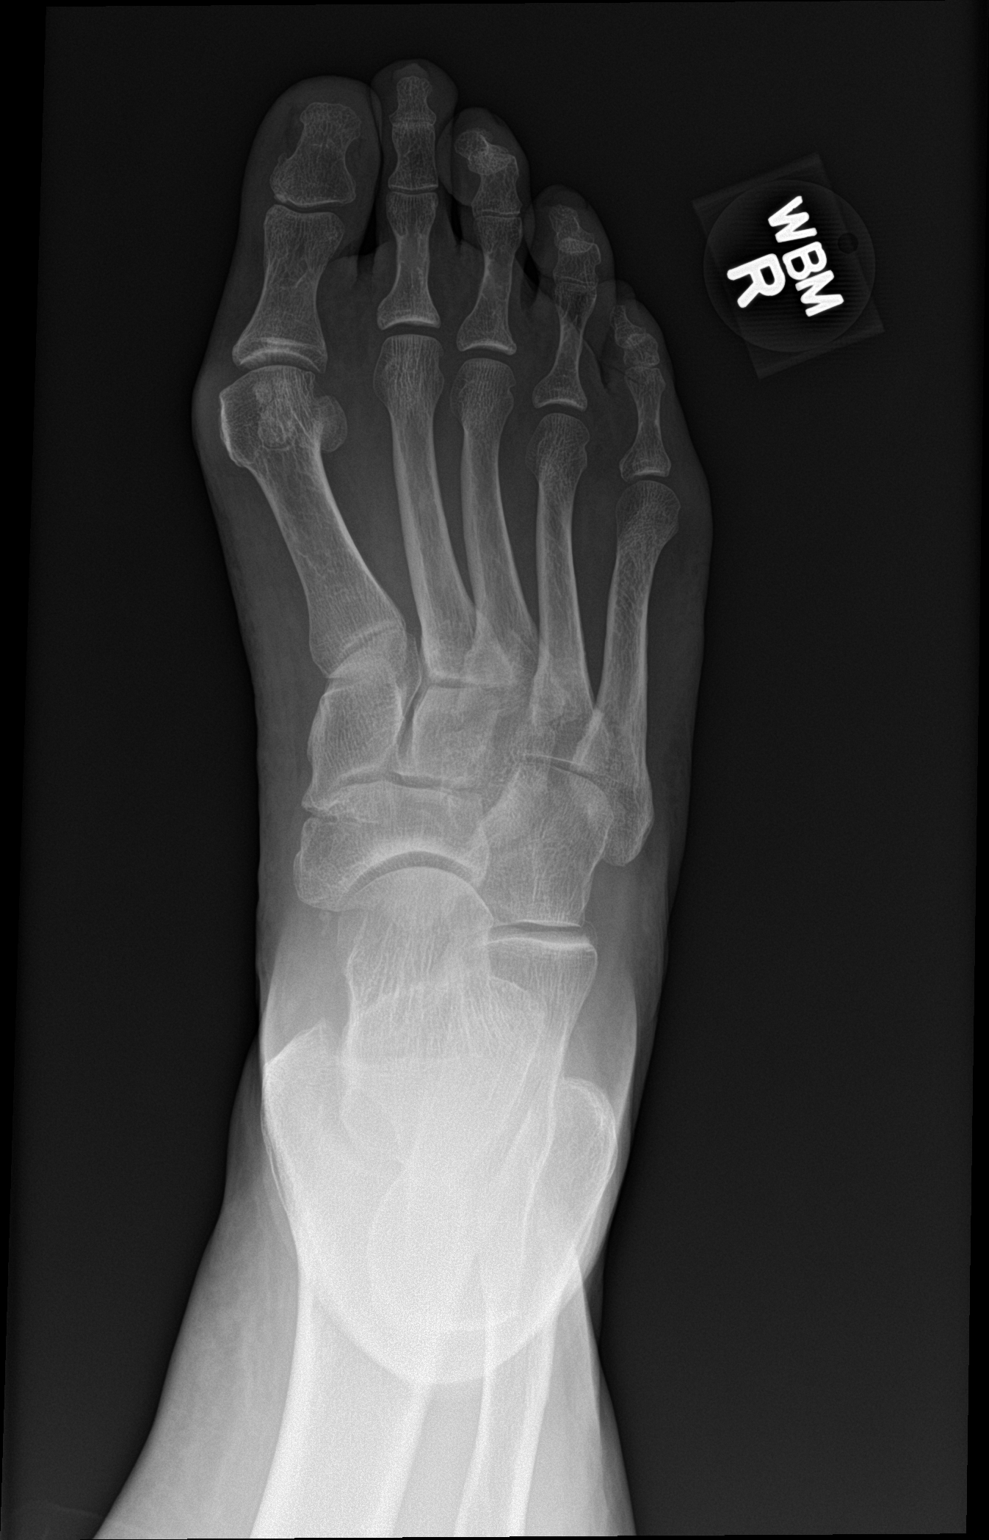

[foot obl]
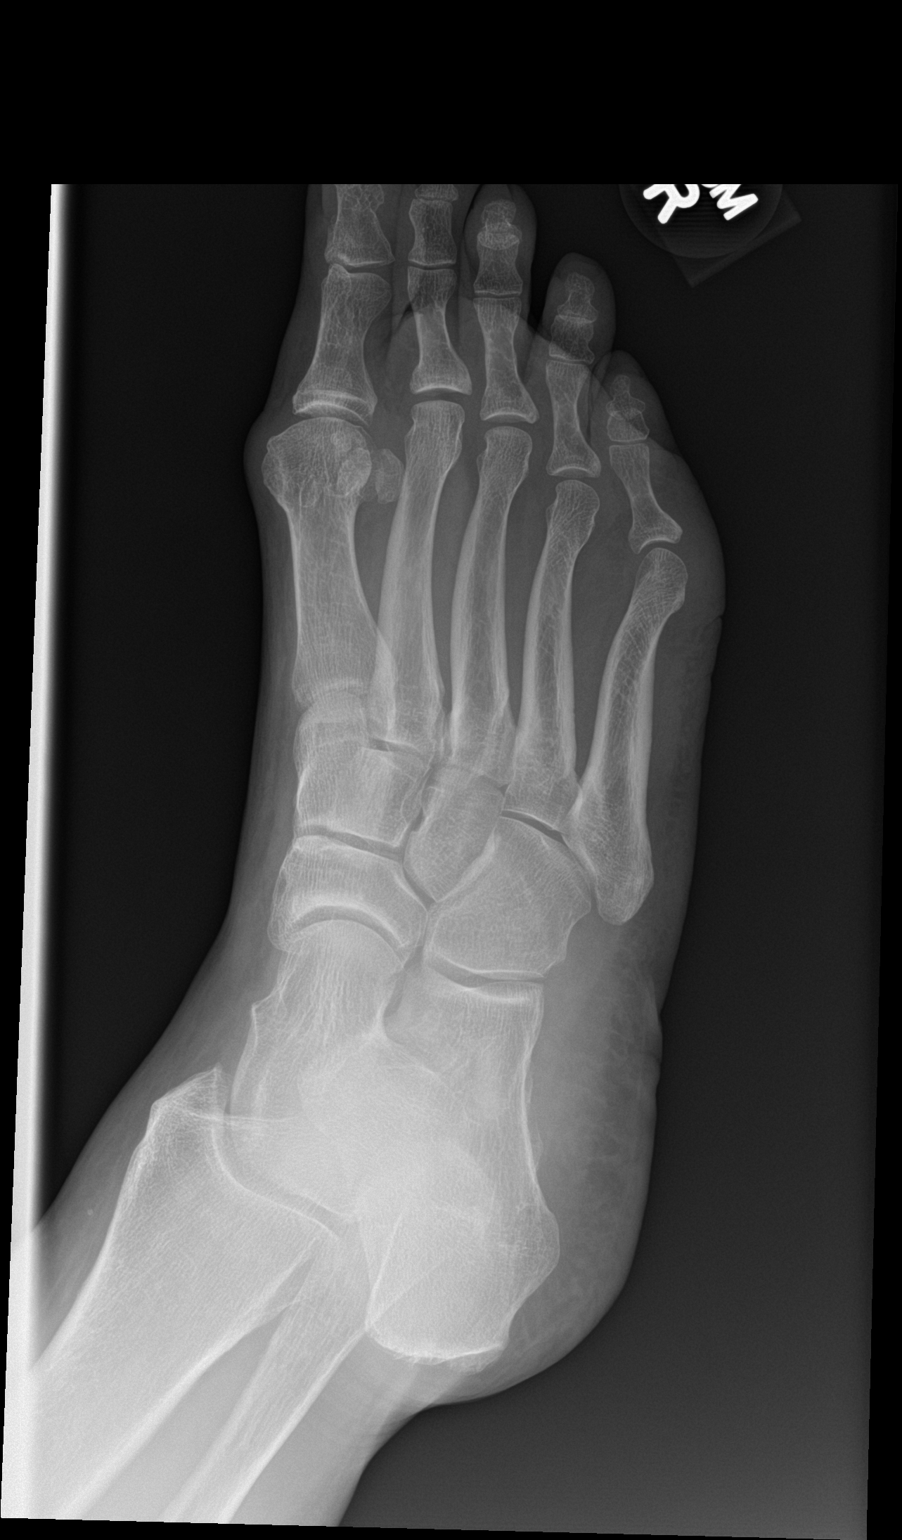

[foot lat]
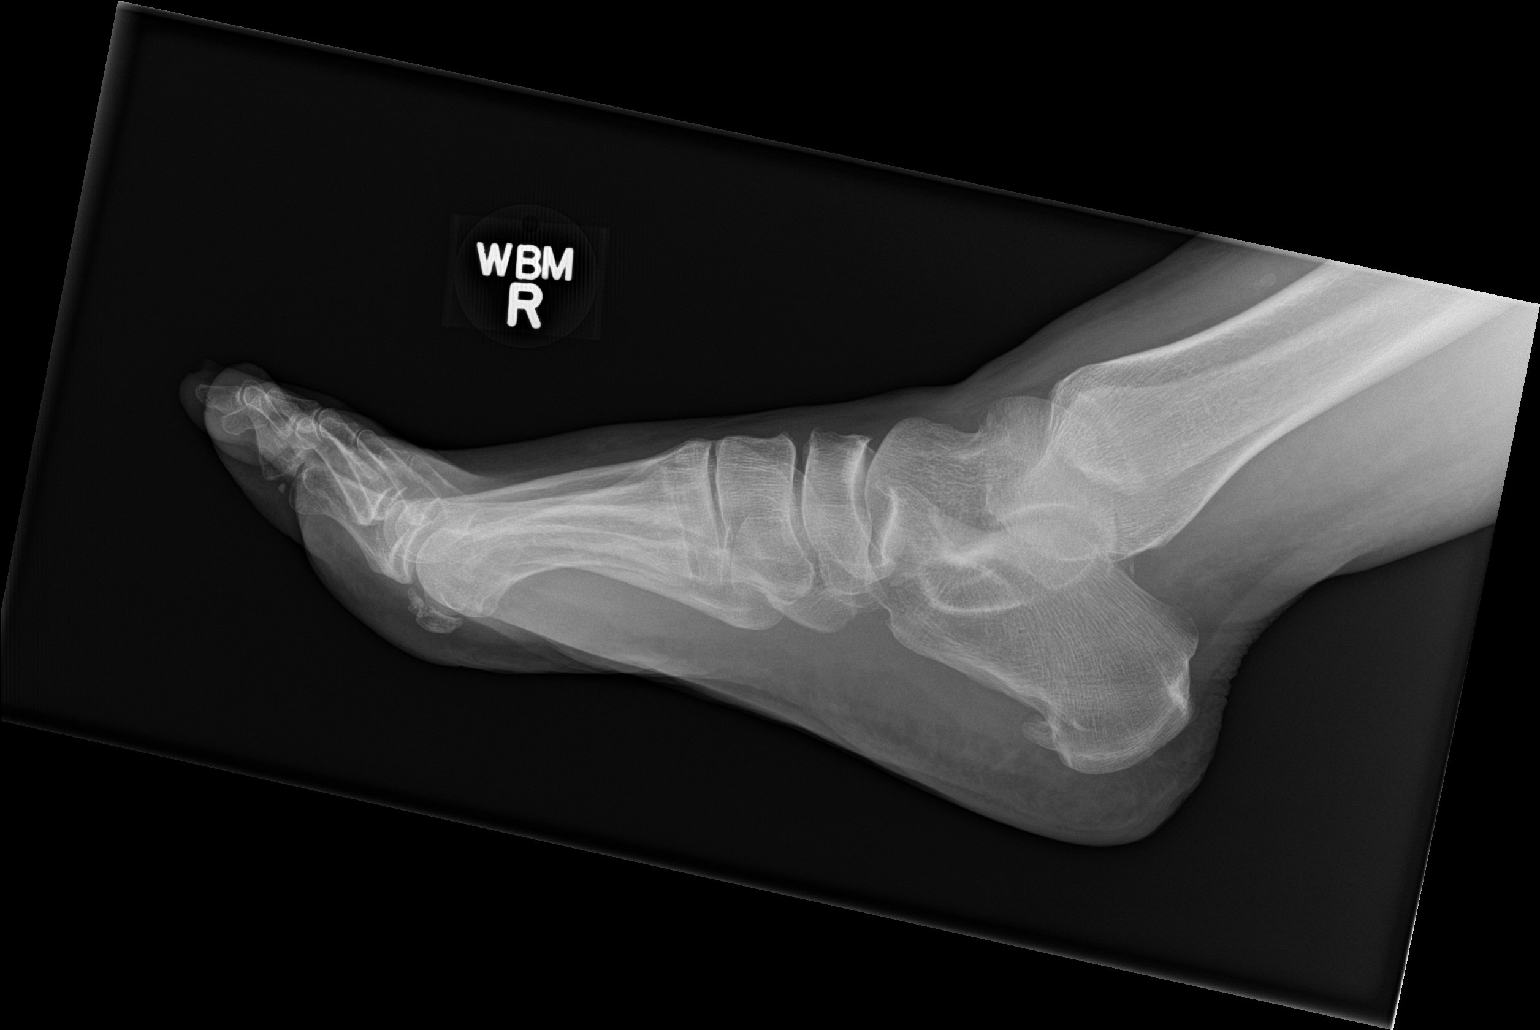

[3 of 3 positions shown; findings below may reference images not displayed]

FINDINGS: Osseous alignment is normal. Bone mineralization is normal. No acute
or suspicious osseous lesion. No fracture line or displaced fracture
fragment. No degenerative change. Adjacent soft tissues are
unremarkable.
IMPRESSION: Negative.

## 2018-07-18 ENCOUNTER — Inpatient Hospital Stay: Payer: 59 | Attending: Hematology and Oncology

## 2018-07-19 ENCOUNTER — Inpatient Hospital Stay: Payer: 59

## 2018-07-19 ENCOUNTER — Other Ambulatory Visit: Payer: Self-pay | Admitting: Hematology and Oncology

## 2018-07-19 ENCOUNTER — Inpatient Hospital Stay: Payer: 59 | Admitting: Hematology and Oncology

## 2018-07-19 DIAGNOSIS — E538 Deficiency of other specified B group vitamins: Secondary | ICD-10-CM

## 2018-07-19 NOTE — Progress Notes (Deleted)
[facility]  Clinic day:  07/19/18   Chief Complaint: Jody Lee is a 47 y.o. female status post gastric bypass surgery and subsequent iron deficiency and B12 deficiency who is seen for 6 month assessment.  HPI: The patient was last seen in the medical oncology clinic on 01/17/2018.  At that time,  she was fatigued.  Exam revealed no palpable adenopathy or hepatosplenomegaly.  Hematocrit was 30.7 and hemoglobin 10.3.   Additional labs on 01/17/2018 revealed a BUN 25 and a creatinine 1.43.  Ferritin was 144.  Iron saturation was 19% with a TIBC of 284.  Sed rate was 33.  Retic was 1.1%.  Folate was 21.0.  TSH was 1.910.  CBC on 02/16/2018 revealed a hematocrit of 33.7, hemoglobin 11.3, and MCV 90.4.    During the interim,   Past Medical History:  Diagnosis Date  . Anemia   . Hypertension   . Menorrhagia   . MS (multiple sclerosis) (HCC)   . Shingles    right side of chest and back  . Vision abnormalities   . Vitamin B 12 deficiency   . Vitamin D deficiency     Past Surgical History:  Procedure Laterality Date  . CHOLECYSTECTOMY    . GASTRIC BYPASS      Family History  Problem Relation Age of Onset  . Stroke Mother   . Heart disease Mother   . Asthma Mother   . Diabetes Mother   . Hypertension Father   . Heart disease Father   . Diabetes Father   . Asthma Brother   . Alcoholism Brother   . Obesity Brother   . Heart disease Brother   . Diabetes Maternal Grandmother   . Lung cancer Maternal Grandfather   . Breast cancer Neg Hx     Social History:  reports that she has never smoked. She has never used smokeless tobacco. She reports that she does not drink alcohol or use drugs.  She recently moved from an apartment into a house.  She got married in 11/2016.  The patient is alone today.  Allergies:  Allergies  Allergen Reactions  . Nsaids Other (See Comments)    Not supposed to take d/t bariatric surgery   . Ciprofloxacin  Rash    Current Medications: Current Outpatient Medications  Medication Sig Dispense Refill  . acetaminophen (TYLENOL) 325 MG tablet Take 650 mg by mouth every 4 (four) hours as needed.    . baclofen (LIORESAL) 10 MG tablet TAKE 1 TABLET(10 MG) BY MOUTH THREE TIMES DAILY 270 tablet 3  . escitalopram (LEXAPRO) 10 MG tablet Take 1 tablet (10 mg total) by mouth daily. 30 tablet 11  . gabapentin (NEURONTIN) 100 MG capsule Take 1 capsule (100 mg total) by mouth 3 (three) times daily. 90 capsule 2  . hydrochlorothiazide (MICROZIDE) 12.5 MG capsule Take 1 capsule (12.5 mg total) by mouth daily. 90 capsule 3  . lisinopril (PRINIVIL,ZESTRIL) 40 MG tablet TAKE 1 TABLET DAILY 90 tablet 3  . loratadine (CLARITIN) 10 MG tablet Take 10 mg by mouth daily.    . MedroxyPROGESTERone Acetate 150 MG/ML SUSY INJECT 1 ML (150 MG) INTRAMUSCULARLY EVERY 3 MONTHS 1 mL 3  . modafinil (PROVIGIL) 200 MG tablet TAKE 1 TABLET BY MOUTH DAILY 30 tablet 0  . ocrelizumab 600 mg in sodium chloride 0.9 % 500 mL Inject 600 mg into the vein once.    Marland Kitchen omeprazole (PRILOSEC) 20 MG capsule Take 20 mg by  mouth 2 (two) times daily as needed.     No current facility-administered medications for this visit.    Facility-Administered Medications Ordered in Other Visits  Medication Dose Route Frequency Provider Last Rate Last Dose  . gadopentetate dimeglumine (MAGNEVIST) injection 20 mL  20 mL Intravenous Once PRN Sater, Pearletha Furl, MD        Review of Systems  Constitutional: Positive for malaise/fatigue. Negative for diaphoresis, fever and weight loss (weight up 15 pounds).       Mentally and physically tired.  HENT: Negative.  Negative for congestion, nosebleeds, sinus pain and sore throat.   Eyes: Negative.  Negative for double vision, photophobia, pain and discharge.  Respiratory: Negative.  Negative for cough, hemoptysis, sputum production and shortness of breath.   Cardiovascular: Negative.  Negative for chest pain,  palpitations, orthopnea, leg swelling and PND.  Gastrointestinal: Positive for heartburn (on PPI therapy). Negative for abdominal pain, blood in stool, constipation, diarrhea, melena, nausea and vomiting.  Genitourinary: Negative.  Negative for dysuria, frequency, hematuria and urgency.  Musculoskeletal: Positive for joint pain (RIGHT foot issues recently due to change in footware.) and myalgias (lower extremities tender to touch). Negative for back pain and falls.  Skin: Negative for itching and rash.  Neurological: Negative.  Negative for dizziness, tremors, weakness and headaches.       Multiple sclerosis; on Ocrevus  Endo/Heme/Allergies: Negative.  Does not bruise/bleed easily.  Psychiatric/Behavioral: Negative for depression, memory loss and suicidal ideas. The patient is not nervous/anxious and does not have insomnia.        Mental fatigue. HYPERsomnia.  All other systems reviewed and are negative.  Performance status (ECOG): 1 - Symptomatic but completely ambulatory   Physical Exam: There were no vitals taken for this visit. GENERAL:  Well developed, well nourished, heavyset woman sitting comfortably in the exam room in no acute distress. MENTAL STATUS:  Alert and oriented to person, place and time. HEAD:  Long brown hair pulled back.  Normocephalic, atraumatic, face symmetric, no Cushingoid features. EYES:  Glasses.  Blue eyes.  Pupils equal round and reactive to light and accomodation.  No conjunctivitis or scleral icterus. ENT:  Oropharynx clear without lesion.  Tongue normal.  Dentures.  Mucous membranes moist.  RESPIRATORY:  Clear to auscultation without rales, wheezes or rhonchi. CARDIOVASCULAR:  Regular rate and rhythm without murmur, rub or gallop. ABDOMEN:  Soft, non-tender, with active bowel sounds, and no hepatosplenomegaly.  No masses. SKIN:  Abdominal striae.  No rashes, ulcers or lesions. EXTREMITIES: No edema, no skin discoloration or tenderness.  No palpable  cords. LYMPH NODES: No palpable cervical, supraclavicular, axillary or inguinal adenopathy  NEUROLOGICAL: Unremarkable. PSYCH:  Appropriate.    No visits with results within 3 Day(s) from this visit.  Latest known visit with results is:  Hospital Outpatient Visit on 04/16/2018  Component Date Value Ref Range Status  . Creatinine, Ser 04/16/2018 1.10* 0.44 - 1.00 mg/dL Final    Assessment:  Jody Lee is a 47 y.o. female  status post gastric bypass surgery (2003) with resultant iron deficiency anemia and B12 deficiency.  She is intolerant of oral iron.   She has iron deficiency.  She receives Venofer when her ferritin is < 30 (last 08/21/2016).  Ferritin has been followed:  62 on 01/04/2015, 73 on 02/22/2015, 40 on 03/25/2015, 10 on 05/28/2015, 61 on 08/21/2015, 20 on 11/12/2015, 56 on 02/12/2016, 33 on 03/27/2016, 79 on 05/20/2016, 45 on 06/26/2016, 28 on 08/20/2016, 50 on 01/08/2017, 103 on  07/12/2017, 62 on 10/15/2017, and 114 on 01/14/2018.   She has B12 deficiency.  She was initially treated with B12 IM, but has subsequently been on oral B12.  B12 was sub-therapeutic on 06/27/2014.  She received weekly B12 x 6 (01/04/2015-02/22/2015) followed by monthly B12 (last 02/16/2018).  Folic acid was normal (12.5) on 01/25/2015.  She takes daily folic acid.    She has multiple sclerosis diagnosis, and was previously treated with Gilenya (fingolimod). There is a 4-7% risk of lymphopenia.  There is a < 1%, post marketing/case reports of lymphoma.  She began Ocrevus infusions on 08/25/2017.  She had shingles in 11/2016.  Symptomatically, she is fatigued.  Exam reveals no palpable adenopathy or hepatosplenomegaly.  Hematocrit is 30.7 and hemoglobin 10.3.  Ferritin is 114.  Plan: 1. Labs today:  CBC with diff, ferritin, sed rate, B12, folate. 2. Iron deficiency s/p gastric bypass surgery:   3. B12 deficiency:   4. Review labs from 01/14/2018. Hemoglobin 10.3, hematocrit 30.7, and MCV 88.7.     5. Discuss drop in hemoglobin from 13.1 to 10.3 in past 3 months.  Etiology unclear.  Check CMP, iron studies, ferritin, sed rate, retic, folate, TSH. 6. Discuss iron stores. Ferritin appears normal at 114.  Ferritin goal 100.  No IV iron needed today.  7. B12 injection today, then continue monthly as previously ordered.  8. Discuss MS diagnosis. Patient follows up on a regular basis with neurology for monitoring and Ocrevus infusions. Continue as already scheduled.  9. RTC in 1 month for labs (CBC with diff) 10. RTC in 6 months for MD assessment, labs (CBC with diff, ferritin,sed rate- day before) + B12   Rosey Bath, MD  07/19/2018, 6:03 AM   I saw and evaluated the patient, participating in the key portions of the service and reviewing pertinent diagnostic studies and records.  I reviewed the nurse practitioner's note and agree with the findings and the plan.  The assessment and plan were discussed with the patient.  A few questions were asked by the patient and answered.   Nelva Nay, MD 07/19/2018,6:03 AM

## 2018-07-20 ENCOUNTER — Other Ambulatory Visit: Payer: Self-pay | Admitting: Neurology

## 2018-07-20 NOTE — Telephone Encounter (Signed)
[place] Database Verified LR: 06-06-18 Qty: 30 Pending appt: 08-31-2018

## 2018-07-21 ENCOUNTER — Telehealth: Payer: Self-pay | Admitting: *Deleted

## 2018-07-21 NOTE — Telephone Encounter (Signed)
Modafinil PA approved by [employer] thru 01/20/19. PA# [id number]

## 2018-07-21 NOTE — Telephone Encounter (Signed)
PA for Modafinil 200mg  tablets completed via CoverMyMeds. Key# [id number]. Dx.: Fatigue related to MS. Continuation of therapy. Start date Nov. 2015 and pt. reports benefit (decreased fatigue) with use./fim

## 2018-08-16 ENCOUNTER — Other Ambulatory Visit: Payer: Self-pay | Admitting: *Deleted

## 2018-08-16 MED ORDER — TRAMADOL HCL 50 MG PO TABS: 50.0000 mg | ORAL_TABLET | Freq: Three times a day (TID) | ORAL | 1 refills | Status: DC | PRN

## 2018-08-18 ENCOUNTER — Telehealth: Payer: Self-pay | Admitting: Neurology

## 2018-08-18 NOTE — Telephone Encounter (Signed)
pt has called for the intrafusion suite, call transferred °

## 2018-08-22 ENCOUNTER — Encounter: Payer: Self-pay | Admitting: Nurse Practitioner

## 2018-08-23 NOTE — Telephone Encounter (Signed)
Mychart message

## 2018-08-26 ENCOUNTER — Ambulatory Visit (INDEPENDENT_AMBULATORY_CARE_PROVIDER_SITE_OTHER): Payer: 59 | Admitting: Nurse Practitioner

## 2018-08-26 ENCOUNTER — Encounter: Payer: Self-pay | Admitting: Nurse Practitioner

## 2018-08-26 ENCOUNTER — Other Ambulatory Visit: Payer: Self-pay

## 2018-08-26 VITALS — BP 129/64 | HR 73 | Temp 98.1°F | Resp 18 | Ht 64.0 in | Wt 299.8 lb

## 2018-08-26 DIAGNOSIS — J0140 Acute pansinusitis, unspecified: Secondary | ICD-10-CM | POA: Diagnosis not present

## 2018-08-26 MED ORDER — AMOXICILLIN-POT CLAVULANATE 875-125 MG PO TABS: 1.0000 | ORAL_TABLET | Freq: Two times a day (BID) | ORAL | 0 refills | Status: AC

## 2018-08-26 NOTE — Progress Notes (Signed)
Subjective:    Patient ID: Jody Lee, female    DOB: 05/01/1971, 48 y.o.   MRN: [id number]  Jody Lee is a 48 y.o. female presenting on 08/26/2018 for Cough (cough, poductive cough, nasal drainage and post nasal drainage x 2.5 weeks  )   HPI Productive cough Patient notes allergy-like symptoms initially wit hitching, sinus congestion.  Since Christmas has had congestion, productive cough, chest tightness.   - mild fever symptoms this week with feeling hot.  No chills/sweats.  - no tooth/jaw pain - increased fatigue, sinus pressure - No ear pressure/pain/fullness.  [facility] - now feels pain in mid back/neck --> is taking tramadol prn from neurology for MS.   Feb 13th for next infusion.  Social History   Tobacco Use  . Smoking status: Never Smoker  . Smokeless tobacco: Never Used  Substance Use Topics  . Alcohol use: No  . Drug use: No    Review of Systems Per HPI unless specifically indicated above     Objective:    BP 129/64 (BP Location: Right Arm, Patient Position: Sitting, Cuff Size: Large)   Pulse 73   Temp 98.1 F (36.7 C) (Oral)   Resp 18   Ht 5\' 4"  (1.626 m)   Wt 299 lb 12.8 oz (136 kg)   SpO2 100%   BMI 51.46 kg/m   Wt Readings from Last 3 Encounters:  08/26/18 299 lb 12.8 oz (136 kg)  03/24/18 290 lb 8 oz (131.8 kg)  01/17/18 293 lb (132.9 kg)    Physical Exam Vitals signs reviewed.  Constitutional:      Appearance: She is well-developed.  HENT:     Head: Normocephalic and atraumatic.     Right Ear: Hearing, tympanic membrane, ear canal and external ear normal.     Left Ear: Hearing, tympanic membrane, ear canal and external ear normal.     Nose: No mucosal edema or rhinorrhea.     Right Sinus: Maxillary sinus tenderness and frontal sinus tenderness present.     Left Sinus: Maxillary sinus tenderness and frontal sinus tenderness present.     Mouth/Throat:     Lips: Pink.     Mouth: Mucous membranes are moist.   Pharynx: Uvula midline. No pharyngeal swelling, oropharyngeal exudate or uvula swelling.     Tonsils: No tonsillar exudate. Swelling: 0 on the right. 0 on the left.  Eyes:     General: Lids are normal.        Right eye: No discharge.        Left eye: No discharge.     Conjunctiva/sclera: Conjunctivae normal.     Pupils: Pupils are equal, round, and reactive to light.  Neck:     Musculoskeletal: Full passive range of motion without pain, normal range of motion and neck supple.  Cardiovascular:     Rate and Rhythm: Normal rate and regular rhythm.     Pulses: Normal pulses.     Heart sounds: Normal heart sounds, S1 normal and S2 normal.  Pulmonary:     Effort: Pulmonary effort is normal. No respiratory distress.     Breath sounds: Normal breath sounds.  Musculoskeletal:     Right lower leg: No edema.     Left lower leg: No edema.  Lymphadenopathy:     Cervical: Cervical adenopathy present.     Right cervical: Superficial cervical adenopathy present.     Left cervical: Superficial cervical adenopathy present.  Skin:  General: Skin is warm and dry.     Capillary Refill: Capillary refill takes less than 2 seconds.  Neurological:     General: No focal deficit present.     Mental Status: She is alert.     GCS: GCS eye subscore is 4. GCS verbal subscore is 5. GCS motor subscore is 6.  Psychiatric:        Attention and Perception: Attention normal.        Mood and Affect: Mood normal.        Behavior: Behavior normal. Behavior is cooperative.      Results for orders placed or performed during the hospital encounter of 04/16/18  I-STAT creatinine  Result Value Ref Range   Creatinine, Ser 1.10 (H) 0.44 - 1.00 mg/dL      Assessment & Plan:   Problem List Items Addressed This Visit    None    Visit Diagnoses    Subacute pansinusitis    -  Primary   Relevant Medications   amoxicillin-clavulanate (AUGMENTIN) 875-125 MG tablet    Consistent with URI and secondary sinusitis with  symptoms worsening over the past 7 days and initial symptoms of nasal congestion and sinus pressure over 2 weeks ago.  No current evidence of bronchitis or pneumonia.  Plan: 1.START taking Augmentin 875-125 mg tablets every 12 hours for 10 days.  Discussed completing antibiotic. - While on antibiotic, take a probiotic OTC or from food. - Continue anti-histamine loratadine 10mg  daily. - Can use Flonase 2 sprays each nostril daily for up to 4-6 weeks if no epistaxis. - Start Mucinex-DM OTC for  7-10 days prn congestion 2. Supportive care with nasal saline, warm herbal tea with honey, 3. Improve hydration 4. Tylenol / Motrin PRN fevers  5. Return criteria given    Meds ordered this encounter  Medications  . amoxicillin-clavulanate (AUGMENTIN) 875-125 MG tablet    Sig: Take 1 tablet by mouth 2 (two) times daily for 10 days.    Dispense:  20 tablet    Refill:  0    Order Specific Question:   Supervising Provider    Answer:   Smitty Cords [[id number]]    Follow up plan: Return 5-7 days if symptoms worsen or fail to improve, for ALSO schedule follow up for physical and blood pressure in March (after 3/8).  [name], DNP, AGPCNP-BC Adult Gerontology Primary Care Nurse Practitioner [facility] 08/26/2018, 9:31 AM

## 2018-08-26 NOTE — Patient Instructions (Addendum)
Jody Lee,   Thank you for coming in to clinic today.  1. You have a bacterial sinus infection.  START Augmentin 875-125 mg one tablet every 12 hours for 10 days.  - Continue with mucinex or mucinex/other brand cough medicine with DM in its name. - RESTART loratadine 10 mg once daily to help reduce mucous production  Please schedule a follow-up appointment with [name], AGNP. Return 5-7 days if symptoms worsen or fail to improve, for ALSO schedule follow up for physical and blood pressure in March (after 3/8).  If you have any other questions or concerns, please feel free to call the clinic or send a message through MyChart. You may also schedule an earlier appointment if necessary.  You will receive a survey after today's visit either digitally by e-mail or paper by [employer]. Your experiences and feedback matter to [place].  Please respond so we know how we are doing as we provide care for you.  [name], DNP, AGNP-BC Adult Gerontology Nurse Practitioner [facility], [facility]

## 2018-08-28 ENCOUNTER — Encounter: Payer: Self-pay | Admitting: Nurse Practitioner

## 2018-08-31 ENCOUNTER — Ambulatory Visit: Payer: 59 | Admitting: Neurology

## 2018-09-06 ENCOUNTER — Encounter: Payer: Self-pay | Admitting: Nurse Practitioner

## 2018-09-07 ENCOUNTER — Encounter: Payer: Self-pay | Admitting: Family Medicine

## 2018-09-07 ENCOUNTER — Ambulatory Visit (INDEPENDENT_AMBULATORY_CARE_PROVIDER_SITE_OTHER): Payer: 59 | Admitting: Family Medicine

## 2018-09-07 VITALS — BP 109/48 | HR 83 | Temp 98.2°F | Resp 16 | Ht 64.0 in | Wt 295.0 lb

## 2018-09-07 DIAGNOSIS — J111 Influenza due to unidentified influenza virus with other respiratory manifestations: Secondary | ICD-10-CM | POA: Diagnosis not present

## 2018-09-07 LAB — POCT INFLUENZA A/B
Influenza A, POC: NEGATIVE
Influenza B, POC: NEGATIVE

## 2018-09-07 MED ORDER — GUAIFENESIN-CODEINE 100-10 MG/5ML PO SYRP: 5.0000 mL | ORAL_SOLUTION | Freq: Three times a day (TID) | ORAL | 0 refills | Status: DC | PRN

## 2018-09-07 MED ORDER — BENZONATATE 200 MG PO CAPS: 200.0000 mg | ORAL_CAPSULE | Freq: Two times a day (BID) | ORAL | 0 refills | Status: DC | PRN

## 2018-09-07 MED ORDER — IPRATROPIUM BROMIDE 0.06 % NA SOLN: 2.0000 | Freq: Four times a day (QID) | NASAL | 0 refills | Status: DC

## 2018-09-07 MED ORDER — BALOXAVIR MARBOXIL(80 MG DOSE) 2 X 40 MG PO TBPK: 80.0000 mg | ORAL_TABLET | Freq: Once | ORAL | 0 refills | Status: AC

## 2018-09-07 NOTE — Progress Notes (Signed)
Subjective:    Patient ID: Jody Lee, female    DOB: 1971/05/19, 48 y.o.   MRN: [id number]  Jody Lee is a 48 y.o. female presenting on 09/07/2018 for Fever (was seen week and half ago for URI treated with antibiotics improved but now onset Monday fever, chills, bodyache, cough Hx of MS she has infusion coming up for MS wants to feel better before infusion OTC not helping and gets SOB at night)   HPI   SUSPECTED INFLUENZA - Last visit with PCP 08/26/18, for initial visit for same problem with sinus congestion URI, treated with Augmentin for sinusitis, see prior notes for background information. - Interval update with improvement, less productive drainage and less phlegm with coughing, felt better, and then more recently over weekend her husband said he felt feverish, joint aching, coughing, and flu like symptoms - Today patient reports now return of similar sinus symptoms onset for past 2 days, she had throat drainage in throat trying to clear, has had return of cough - She works at home, on phone for work, aggravates her cough. No other sick contacts - Tried vicks vapor rub - Admits mild feverish sensation - Admits bodyache Addonal history she has multiple sclerosis, on medication, she is between treatments currently - Denies any chills, sweats, nausea vomiting, diarrhea   Health Maintenance: Did not receive flu shot this year  Depression screen [facility] 2/9 09/07/2018 12/31/2017 11/13/2016  Decreased Interest 0 1 0  Down, Depressed, Hopeless 0 1 0  PHQ - 2 Score 0 2 0  Altered sleeping 0 1 -  Tired, decreased energy 0 1 -  Change in appetite 0 1 -  Feeling bad or failure about yourself  0 0 -  Trouble concentrating 0 1 -  Moving slowly or fidgety/restless 0 0 -  Suicidal thoughts 0 0 -  PHQ-9 Score 0 6 -  Difficult doing work/chores Not difficult at all Somewhat difficult -    Social History   Tobacco Use  . Smoking status: Never Smoker  . Smokeless tobacco: Never Used    Substance Use Topics  . Alcohol use: No  . Drug use: No    Review of Systems Per HPI unless specifically indicated above     Objective:    BP (!) 109/48   Pulse 83   Temp 98.2 F (36.8 C) (Oral)   Resp 16   Ht 5\' 4"  (1.626 m)   Wt 295 lb (133.8 kg)   SpO2 99%   BMI 50.64 kg/m   Wt Readings from Last 3 Encounters:  09/07/18 295 lb (133.8 kg)  08/26/18 299 lb 12.8 oz (136 kg)  03/24/18 290 lb 8 oz (131.8 kg)    Physical Exam Vitals signs and nursing note reviewed.  Constitutional:      General: She is not in acute distress.    Appearance: She is well-developed. She is not diaphoretic.     Comments: Mildly ill-appearing, uncomfortable with cough, cooperative, obese  HENT:     Head: Normocephalic and atraumatic.     Comments: Frontal / maxillary sinuses non-tender. Nares patent without purulence or edema. Bilateral TMs clear without erythema, effusion or bulging. Oropharynx mild posterior drainage, without erythema, exudates, edema or asymmetry. Eyes:     General:        Right eye: No discharge.        Left eye: No discharge.     Conjunctiva/sclera: Conjunctivae normal.  Neck:     Musculoskeletal: Normal range  of motion and neck supple.     Thyroid: No thyromegaly.  Cardiovascular:     Rate and Rhythm: Normal rate and regular rhythm.     Heart sounds: Normal heart sounds. No murmur.  Pulmonary:     Effort: Pulmonary effort is normal. No respiratory distress.     Breath sounds: Rhonchi present. No wheezing or rales.     Comments: Frequent deep coughing. No focal abnormality or wheezing. Musculoskeletal: Normal range of motion.  Lymphadenopathy:     Cervical: No cervical adenopathy.  Skin:    General: Skin is warm and dry.     Findings: No erythema or rash.  Neurological:     Mental Status: She is alert and oriented to person, place, and time.  Psychiatric:        Behavior: Behavior normal.     Comments: Well groomed, good eye contact, normal speech and  thoughts       Results for orders placed or performed in visit on 09/07/18  POCT Influenza A/B  Result Value Ref Range   Influenza A, POC Negative Negative   Influenza B, POC Negative Negative      Assessment & Plan:   Problem List Items Addressed This Visit    None    Visit Diagnoses    Influenza    -  Primary   Relevant Medications   Baloxavir Marboxil,80 MG Dose, (XOFLUZA) 2 x 40 MG TBPK   benzonatate (TESSALON) 200 MG capsule   guaiFENesin-codeine (ROBITUSSIN AC) 100-10 MG/5ML syrup   ipratropium (ATROVENT) 0.06 % nasal spray   Other Relevant Orders   POCT Influenza A/B (Completed)       Clinically diagnosed influenza despite negative rapid flu test today, concern for flu still due to significant known exposure. - Duration x 48 hours acutely, without complication. Tolerating PO and well hydrated - No other focal findings of infection today - Did not receive influenza vaccine this season - high risk patient with MS on chemo  Plan: 1. Start Xofluza - sample given today x 2 of 40mg  tab = 80 - Start Tessalon Perls take 1 capsule up to 3 times a day as needed for cough - Start Atrovent nasal spray decongestant 2 sprays in each nostril up to 4 times daily for 7 days - Given rx printed cough syrup as night PRN medicine only if need otherwise do not fill - Offered future prednisone if wheezing, given history of asthma some persistent cough. Return criteria given if significant worsening, consider post-influenza complications, otherwise follow-up if needed   Meds ordered this encounter  Medications  . Baloxavir Marboxil,80 MG Dose, (XOFLUZA) 2 x 40 MG TBPK    Sig: Take 80 mg by mouth once for 1 dose. For Flu    Dispense:  1 each    Refill:  0  . benzonatate (TESSALON) 200 MG capsule    Sig: Take 1 capsule (200 mg total) by mouth 2 (two) times daily as needed for cough.    Dispense:  30 capsule    Refill:  0  . guaiFENesin-codeine (ROBITUSSIN AC) 100-10 MG/5ML syrup     Sig: Take 5-10 mLs by mouth 3 (three) times daily as needed for cough.    Dispense:  118 mL    Refill:  0  . ipratropium (ATROVENT) 0.06 % nasal spray    Sig: Place 2 sprays into both nostrils 4 (four) times daily. For up to 5-7 days then stop.    Dispense:  15 mL  Refill:  0    Follow up plan: Return in about 1 week (around 09/14/2018) for cough / flu.   Saralyn Pilar, DO [facility] 09/07/2018, 9:56 AM

## 2018-09-07 NOTE — Patient Instructions (Addendum)
Thank you for coming to the office today.  Your flu test was NEGATIVE, this is not 100% though, and you can still have the flu with a negative test, otherwise it could be a different viral syndrome.  Too soon after antibiotics, I think this is a virus - but if not improve within 48 hours call us and we can consider antibiotic and or X-ray Chest. May consider Prednisone steroid as well if need for cough.  1. Start Xofluza 2 pills today then done, completed course for flu - Wash hands and cover cough very well to avoid spread of infection - For symptom control:    Tylenol 500-1000mg  per dose every 6-8 hours or 3 times a day, can alternate dosing      - Start Tessalon perls one every 8 hours or 3 times a day as needed for cough      - Start Atrovent nasal spray decongestant 2 sprays in each nostril up to 4 times daily for 7 days      - Start OTC Mucinex-DM for cough and congestion for up to 7 days - Improve hydration with plenty of clear fluids  If significant worsening with poor fluid intake, worsening fever, difficulty breathing due to coughing, worsening body aches, weakness, or other more concerning symptoms difficulty breathing you can seek treatment at Emergency Department. Also if improved flu symptoms and then worsening days to week later with concerns for bronchitis, productive cough fever chills again we may need to check for possible pneumonia that can occur after the flu   Please schedule a Follow-up Appointment to: Return in about 1 week (around 09/14/2018) for cough / flu.  If you have any other questions or concerns, please feel free to call the office or send a message through MyChart. You may also schedule an earlier appointment if necessary.  Additionally, you may be receiving a survey about your experience at our office within a few days to 1 week by e-mail or mail. We value your feedback.  Saralyn Pilar, DO [facility], [place]

## 2018-09-08 ENCOUNTER — Ambulatory Visit: Payer: 59 | Admitting: Nurse Practitioner

## 2018-09-12 ENCOUNTER — Ambulatory Visit: Payer: 59 | Admitting: Family Medicine

## 2018-09-13 ENCOUNTER — Ambulatory Visit
Admission: RE | Admit: 2018-09-13 | Discharge: 2018-09-13 | Disposition: A | Discharge status: Home / Self Care | Payer: 59 | Source: Ambulatory Visit | Attending: Nurse Practitioner | Admitting: Nurse Practitioner

## 2018-09-13 ENCOUNTER — Ambulatory Visit (INDEPENDENT_AMBULATORY_CARE_PROVIDER_SITE_OTHER): Payer: 59 | Admitting: Nurse Practitioner

## 2018-09-13 ENCOUNTER — Encounter: Payer: Self-pay | Admitting: Nurse Practitioner

## 2018-09-13 ENCOUNTER — Other Ambulatory Visit: Payer: Self-pay

## 2018-09-13 VITALS — BP 123/78 | HR 69 | Temp 98.2°F | Resp 16 | Ht 64.0 in | Wt 295.8 lb

## 2018-09-13 DIAGNOSIS — J40 Bronchitis, not specified as acute or chronic: Secondary | ICD-10-CM | POA: Diagnosis not present

## 2018-09-13 DIAGNOSIS — R05 Cough: Secondary | ICD-10-CM | POA: Diagnosis not present

## 2018-09-13 DIAGNOSIS — R058 Other specified cough: Secondary | ICD-10-CM

## 2018-09-13 MED ORDER — PREDNISONE 50 MG PO TABS: 50.0000 mg | ORAL_TABLET | Freq: Every day | ORAL | 0 refills | Status: AC

## 2018-09-13 MED ORDER — ALBUTEROL SULFATE HFA 108 (90 BASE) MCG/ACT IN AERS: 1.0000 | INHALATION_SPRAY | Freq: Four times a day (QID) | RESPIRATORY_TRACT | 0 refills | Status: DC | PRN

## 2018-09-13 NOTE — Patient Instructions (Addendum)
Jody Lee,   Thank you for coming in to clinic today.  1. Chest xray today. - Looking for bronchitis vs pneumonia.  Treat for additional antibiotics as needed.  Please schedule a follow-up appointment with [name], AGNP. Return 5-7 days if symptoms worsen or fail to improve.  If you have any other questions or concerns, please feel free to call the clinic or send a message through MyChart. You may also schedule an earlier appointment if necessary.  You will receive a survey after today's visit either digitally by e-mail or paper by [employer]. Your experiences and feedback matter to [place].  Please respond so we know how we are doing as we provide care for you.   [name], DNP, AGNP-BC Adult Gerontology Nurse Practitioner [facility], [facility]

## 2018-09-13 NOTE — Progress Notes (Signed)
Subjective:    Patient ID: Jody Lee, female    DOB: 08/22/1970, 49 y.o.   MRN: [id number]  Jody Lee is a 48 y.o. female presenting on 09/13/2018 for Cough (has been seen several times over the past 3 weeks, last visit saw Dr. Kirtland Bouchard and flu test was neg but prescribed Zovarax codeine cough medication, having chills , fever, and cough having headaches)   HPI Cough Prolonged cough x 3 weeks.   - More breathless, coughing the more Jody Lee talks.  Worsening again at night. - Chills and fevers are happening throughout the day, daily. - only mild sinus pressure, but is having regular headache (today occipital) - mild nasal stuffiness, without rhinorrhea - mild to mod post nasal drip - Cough deep and productive, chest tightness with cough  Patient took augmentin 08/26/2018 with improvement toward end of this course, but got worse after finishing.  Patient also notes granddaughter is getting sick again (21.77 years old) and hopes Jody Lee can avoid that sickness as well.  Social History   Tobacco Use  . Smoking status: Never Smoker  . Smokeless tobacco: Never Used  Substance Use Topics  . Alcohol use: No  . Drug use: No    Review of Systems Per HPI unless specifically indicated above     Objective:    BP 123/78   Pulse 69   Temp 98.2 F (36.8 C) (Oral)   Resp 16   Ht 5\' 4"  (1.626 m)   Wt 295 lb 12.8 oz (134.2 kg)   SpO2 99%   BMI 50.77 kg/m   Wt Readings from Last 3 Encounters:  09/13/18 295 lb 12.8 oz (134.2 kg)  09/07/18 295 lb (133.8 kg)  08/26/18 299 lb 12.8 oz (136 kg)    Physical Exam Vitals signs reviewed.  Constitutional:      Appearance: Jody Lee is well-developed.  HENT:     Head: Normocephalic and atraumatic.     Right Ear: Hearing, tympanic membrane, ear canal and external ear normal.     Left Ear: Hearing, tympanic membrane, ear canal and external ear normal.     Nose: No mucosal edema or rhinorrhea.     Right Sinus: No maxillary sinus tenderness or frontal  sinus tenderness.     Left Sinus: No maxillary sinus tenderness or frontal sinus tenderness.     Mouth/Throat:     Lips: Pink.     Mouth: Mucous membranes are moist.     Pharynx: Uvula midline. Posterior oropharyngeal erythema (mildly injected) present. No pharyngeal swelling, oropharyngeal exudate or uvula swelling.     Tonsils: No tonsillar exudate. Swelling: 0 on the right. 0 on the left.  Eyes:     General: Lids are normal.        Right eye: No discharge.        Left eye: No discharge.     Conjunctiva/sclera: Conjunctivae normal.     Pupils: Pupils are equal, round, and reactive to light.  Neck:     Musculoskeletal: Full passive range of motion without pain, normal range of motion and neck supple.  Cardiovascular:     Rate and Rhythm: Normal rate and regular rhythm.     Pulses: Normal pulses.     Heart sounds: Normal heart sounds, S1 normal and S2 normal.  Pulmonary:     Effort: Pulmonary effort is normal. No respiratory distress.     Breath sounds: Normal air entry. Wheezing and rhonchi present. No decreased breath sounds.  Musculoskeletal:  Right lower leg: No edema.     Left lower leg: No edema.  Lymphadenopathy:     Cervical: Cervical adenopathy present.     Right cervical: Superficial cervical adenopathy present.     Left cervical: Superficial cervical adenopathy present.  Skin:    General: Skin is warm and dry.     Capillary Refill: Capillary refill takes less than 2 seconds.  Neurological:     General: No focal deficit present.     Mental Status: Jody Lee is alert.     GCS: GCS eye subscore is 4. GCS verbal subscore is 5. GCS motor subscore is 6.  Psychiatric:        Attention and Perception: Attention normal.        Mood and Affect: Mood normal.        Behavior: Behavior normal. Behavior is cooperative.    Results for orders placed or performed in visit on 09/07/18  POCT Influenza A/B  Result Value Ref Range   Influenza A, POC Negative Negative   Influenza B,  POC Negative Negative      Assessment & Plan:   Problem List Items Addressed This Visit    None    Visit Diagnoses    Productive cough    -  Primary   Relevant Medications   albuterol (PROVENTIL HFA;VENTOLIN HFA) 108 (90 Base) MCG/ACT inhaler   Other Relevant Orders   DG Chest 2 View (Completed)   Bronchitis        Clinically consistent with bronchitis after influenza infection.   Cannot exclude pneumonia at this time without Xray - Still desires to get flu shot if possible prior to next immunosuppressant infustion  Plan: 1. Start prednisone 50 mg one tablet daily for 5 days. 2. Supportive care as advised with NSAID / Tylenol PRN fever/myalgias, improve hydration, may take OTC Cold/Flu meds 3. START albuterol 1-2 puffs every 6 hours prn wheezing or shortness of breath  4. Return criteria given if significant worsening, consider post-influenza complications, otherwise follow-up if needed    Meds ordered this encounter  Medications  . albuterol (PROVENTIL HFA;VENTOLIN HFA) 108 (90 Base) MCG/ACT inhaler    Sig: Inhale 1-2 puffs into the lungs every 6 (six) hours as needed for wheezing or shortness of breath.    Dispense:  1 Inhaler    Refill:  0    Product selection permitted for insurance/patient brand preference    Order Specific Question:   Supervising Provider    Answer:   Smitty CordsKARAMALEGOS, ALEXANDER J [[id number]]  . predniSONE (DELTASONE) 50 MG tablet    Sig: Take 1 tablet (50 mg total) by mouth daily with breakfast for 5 days.    Dispense:  5 tablet    Refill:  0    Order Specific Question:   Supervising Provider    Answer:   Smitty CordsKARAMALEGOS, ALEXANDER J [[id number]]     Follow up plan: Return 5-7 days if symptoms worsen or fail to improve.  Wilhelmina McardleLauren Shoshanna Mcquitty, DNP, AGPCNP-BC Adult Gerontology Primary Care Nurse Practitioner [facility] 09/13/2018, 10:23 AM

## 2018-09-19 ENCOUNTER — Encounter: Payer: Self-pay | Admitting: Nurse Practitioner

## 2018-09-20 ENCOUNTER — Other Ambulatory Visit: Payer: Self-pay | Admitting: Nurse Practitioner

## 2018-09-20 DIAGNOSIS — I10 Essential (primary) hypertension: Secondary | ICD-10-CM

## 2018-09-22 ENCOUNTER — Telehealth: Payer: Self-pay | Admitting: *Deleted

## 2018-09-22 DIAGNOSIS — G35 Multiple sclerosis: Secondary | ICD-10-CM | POA: Diagnosis not present

## 2018-09-22 NOTE — Telephone Encounter (Signed)
FMLA forms completed, copy given directly to pt. while she was in the infusion suite today. Copy sent to be scanned into EMR/fim

## 2018-10-04 ENCOUNTER — Other Ambulatory Visit: Payer: Self-pay | Admitting: Neurology

## 2018-10-06 ENCOUNTER — Other Ambulatory Visit: Payer: Self-pay | Admitting: Nurse Practitioner

## 2018-10-06 DIAGNOSIS — R058 Other specified cough: Secondary | ICD-10-CM

## 2018-10-06 DIAGNOSIS — R05 Cough: Secondary | ICD-10-CM

## 2018-10-08 ENCOUNTER — Other Ambulatory Visit: Payer: Self-pay | Admitting: Family Medicine

## 2018-10-08 DIAGNOSIS — J111 Influenza due to unidentified influenza virus with other respiratory manifestations: Secondary | ICD-10-CM

## 2018-10-28 ENCOUNTER — Encounter: Payer: 59 | Admitting: Nurse Practitioner

## 2018-11-01 ENCOUNTER — Other Ambulatory Visit: Payer: Self-pay | Admitting: Nurse Practitioner

## 2018-11-01 DIAGNOSIS — J111 Influenza due to unidentified influenza virus with other respiratory manifestations: Secondary | ICD-10-CM

## 2018-11-10 ENCOUNTER — Encounter: Payer: Self-pay | Admitting: Neurology

## 2018-11-10 ENCOUNTER — Ambulatory Visit (INDEPENDENT_AMBULATORY_CARE_PROVIDER_SITE_OTHER): Payer: 59 | Admitting: Neurology

## 2018-11-10 ENCOUNTER — Other Ambulatory Visit: Payer: Self-pay

## 2018-11-10 DIAGNOSIS — G4719 Other hypersomnia: Secondary | ICD-10-CM | POA: Insufficient documentation

## 2018-11-10 DIAGNOSIS — R350 Frequency of micturition: Secondary | ICD-10-CM

## 2018-11-10 DIAGNOSIS — Z79899 Other long term (current) drug therapy: Secondary | ICD-10-CM

## 2018-11-10 DIAGNOSIS — R200 Anesthesia of skin: Secondary | ICD-10-CM

## 2018-11-10 DIAGNOSIS — R269 Unspecified abnormalities of gait and mobility: Secondary | ICD-10-CM

## 2018-11-10 DIAGNOSIS — G35 Multiple sclerosis: Secondary | ICD-10-CM | POA: Diagnosis not present

## 2018-11-10 DIAGNOSIS — R0683 Snoring: Secondary | ICD-10-CM | POA: Insufficient documentation

## 2018-11-10 DIAGNOSIS — R5383 Other fatigue: Secondary | ICD-10-CM | POA: Diagnosis not present

## 2018-11-10 NOTE — Progress Notes (Signed)
[facility]  PATIENT: Jody Lee DOB: [birth year]  REFERRING DOCTOR OR PCP:  Venora Maples SOURCE: patient and labs, notes in EMR and MRI on PACS ________________________________   HISTORICAL  CHIEF COMPLAINT:  Chief Complaint  Patient presents with   Multiple Sclerosis    She is on Ocrevus.  Last infusion February 2020.    HISTORY OF PRESENT ILLNESS:  Jody Lee is a 48 y.o. woman with MS diagnosed in 2005.     Update 11/10/2018: Virtual Visit via Video Note  I connected with Jody Lee on 11/10/18 at 11:30 AM EDT by a video enabled telemedicine application and verified that I am speaking with the correct person using two identifiers.   I discussed the limitations of evaluation and management by telemedicine and the availability of in person appointments. The patient expressed understanding and agreed to proceed.  History of Present Illness: She was switched to Ocrevus over a year ago.   She had her last infusion in February, 2020.  On Ocrevus she has not had any exacerbations and feels mostly stable.  Physically, gait is slightly worse with a mild right foot drop if more tired.  She has 2 falls in the living room.  One fall had bruises but no LOC.  She gets cramping in her legs and feet that seems worse.   Cramps are worse during the day.   Baclofen helps some and she tolerates it well.   She notes more hand weakness and some cramping.   Both hands seem mildly weak and sometimes she gets wrist   She has more fatigue and notes decreased focus/attention.   Provigil has helped these symptoms.   Mood is doing well on current medications.  She has some insomnia, getting 4-5 hours sleep many nights.   She snores a little but sometimes has some moaning with struggled breathing and some gasping/snorting.  It sometimes wakes her up.  EPWORTH SLEEPINESS SCALE  On a scale of 0 - 3 what is the chance of dozing:  Sitting and Reading:  2 Watching  TV:   2 Sitting inactive in a public place: 0 Passenger in car for one hour: 3 Lying down to rest in the afternoon: 2 Sitting and talking to someone: 0 Sitting quietly after lunch:  1 In a car, stopped in traffic:  0  Total (out of 24):   Mild excesively sleepy    She had several rounds of antibiotics for probable bronchitis in December and January. Steroids were added and she improved.      She works from home but her husband has to work outside and her three kids and one grandchild are in the house    Observations/Objective: She is a well-developed well-nourished woman in no acute distress.  Head is normocephalic and atraumatic.  Visual skin was normal.  Neck had good range of motion.  She is alert and fully oriented with fluent speech and good attention, focus and memory.  Extraocular muscles were intact.  Facial strength and trapezius strength were normal.  Strength and coordination appeared normal in the hands.  The station and gait were fairly normal.  Tandem gait was mildly wide.  Assessment and Plan: Multiple sclerosis (HCC)  High risk medication use  Other fatigue  Gait disturbance  Numbness  Urinary frequency   1.   She will continue Ocrevus.  At the next follow-up visit we will check IgG/IgM and CBC.  We had a discussion about Ocrevus being an immunosuppressant.  It is possible that it can lead to a higher rate of upper respiratory infections and more complications from them.  She is trying to isolate as best she can. 2.    Baclofen for muscle spasms.  Continue other medications. 3.   Try to stay active and exercise as tolerated. 4.    We discussed a sleep study due to her snoring/gasping at night and mild excessive daytime sleepiness.  This may have to be done after the viral situation improves. 5.    She will return to see me in 6 months or sooner if there are new or worsening neurologic symptoms.  Follow Up Instructions: I discussed the assessment and treatment  plan with the patient. The patient was provided an opportunity to ask questions and all were answered. The patient agreed with the plan and demonstrated an understanding of the instructions.   The patient was advised to call back or seek an in-person evaluation if the symptoms worsen or if the condition fails to improve as anticipated.  I provided 25 minutes of non-face-to-face time during this encounter.   Asa Lente, MD  __________________________ From prior notes Update 03/24/2018: She had Ocrevus 1st course in February 2019 and just had her second dose 03/18/2018.   She slept through the infusion.      She tolerated it well and she has not had any exacerbation since starting.      Physically she feels baseline.     She is walking well.   If very tired the right foot drops some.    Strength is mildly reduced right worse than left.   Her  grips are mildly weaker.   She denies numbness or dysesthesia.    Vision is ok.     She has left astigmatism.   Colors are mildly desaturated on left.     Bladder is fine.   She is depressed and stressed with the recent death oif her mother (had heart valve surgery).   She would like to be on an antidepressant.   She has never been on one on the past.     She is tearful, even at work.     While her mom ws in the hospital she was physically and mentally very fatigued (she was hospitalized x 8 weeks).   Before her mom's long hospital stay, fatigue was mostly mild.    Cognition is ok with mikd atentional issues.      Update 09/24/2017: She completed the first 2 halves of the first ocrelizumab infusion last week. She did have some nausea and heartburn the day of the infusion an next day but otherwise felt fine.    Heartburn improved with Zantac.  She denies any new symptoms.   The hand tingling is resolved.      No new weakness.   Her gait is better but will occ stumble.     She can walk a mile.   Vision seems to be stable.   She did need a new prescription.   She has diarrhea since gastric bypass surgery.  She also has anemia and had B12 deficiency.  She has urinary frequency, unchanged in severity.   Modafinil has helped her fatigue and attentional issues.   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   Mood is doing well.    She sleeps well most nights.    She feels cognition is back to baseline.    Update 06/24/2017:  I personally reviewed the results and images of her 06/15/2017 MRI.  She has one enhancing lesion on the MRI of the brain and also has a new lesion that is nonenhancing in the cervical spine.  She notes more muscle stiffness and more right hand tingling the past 2 months.    In detail, we discussed several options for treatment due to her breakthrough while on Gilenya. Specifically we went over the risks and benefits of Tysabri, Lemtrada and ocrelizumab. We will check blood work to determine if any of these are contraindicated. She is most interested in either Tysabri or ocrelizumab   Update 05/07/2017: She is on Gilenya for her relapsing multiple sclerosis. She tolerates it well. She has not had any definite exacerbation recently, though one week ago she had one day where she was dizzy the symptoms have since resolved.   When she had the vertigo, she felt her gait was tilted to the one side.     For the most part, gait does well but the right foot drags sometimes.     She denies significant numbness or weakness,    She needs stronger glasses and one day she felt her vision was cloudy for several hours.   She has some urinary frequency and notes bladder pain at times.      In general, fatigue is ok most days but she has occasional times it is worse.   She walks one mile daily as exercise.    She also had one week in August with severe fatigue.  She felt back to baseline the next week.  Mood is doing well.     Provigil has helped her.     After the last visit, she developed shingles. She feels she has completely  recovered from the shingles of the right thorax/axilla.    ______________________________ From 10/21/2016  MS:   She is on Gilenya and tolerates it well.     She has not had any definite exacerbation.   She has noted more rightleg spasms.   Spine MRI's showed several chronic plaques in her cervical spine and one in the thoracic spine.,    Gait/strength/sensation:  She sometimes stumbles due to her right foot being slightly weak.    Sometimes she veers while walking.  No falls.      She denies any major problem with weakness or numbness. She has more trouble using a can opener and occasionally is dropping items.     She gets right foot cramps, helped by baclofen..     Bladder: She notes worsening urinary frequency and urgency. She does not have incontinence.   She has fewer spasms.    Vision: She denies any MS related visual problems.   She has new glasses  Fatigue/sleep: She reports physical and cognitive fatigue. She has noted a benefit with Provigil. Adderall was not well tolerated.     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   She also takes a lot of caffeine on days that she needs to stay up longer or if she is out of her Provigil. She has some difficulty falling asleep but once asleep often stays asleep.    Often she has difficulty turning her mind off.   Her fianc has noted that she moans that her sleep sometimes. She has not had any screaming, kicking or punching.  Mood/cognition: She denies any major difficulty with depression or anxiety  at this time. She notes some cognitive difficulty. This is mostly problems with focusing and distraction.  She feels cognition does better with Provigil as well .Marland Kitchen She strays mentally active.  Other: Her anemia is doing better and she gets intermittent iron infusions and B12 monthly.   MS History:   She was diagnosed with MS in 2005 after presenting with a spinal cord  syndrome. She started with numbness and pain in the feet that worked its way up to her abdomen over a couple days.   Initially, she had an MRI of the spine that showed a plaque consistent with her symptoms. She was referred to Dr. Maple Hudson. He did a lumbar puncture and ordered an MRI of the brain. Both were consistent with multiple sclerosis and I started to see her. Initially, she was placed on Betaseron and did very well for the first few years. Then around 2011 or 2012, she had an exacerbation and testing showed that she had developed antibodies against Betaseron. Therefore, she was switched to Gilenya at that time. She has been on Gilenya for the last 4+ years and has done well. Specifically, she has not reported any difficulties with exacerbations and she tolerates the medication well.   Her last MRI was performed at [facility] November 2015.  MRI of the brain from 03/02/2013 showed  typical periventricular T2/FLAIR hyperintense foci, predominantly in the periventricular white matter.   There were no acute foci.   REVIEW OF SYSTEMS: Constitutional: No fevers, chills, sweats, or change in appetite.   She has fatigue. Eyes: No visual changes, double vision, eye pain Ear, nose and throat: No hearing loss, ear pain, nasal congestion, sore throat Cardiovascular: No chest pain, palpitations Respiratory: No shortness of breath at rest or with exertion.   No wheezes GastrointestinaI: No nausea, vomiting, diarrhea, abdominal pain, fecal incontinence Genitourinary: No dysuria, urinary retention or frequency.  No nocturia. Musculoskeletal: No neck pain, back pain Integumentary: No rash, pruritus, skin lesions Neurological: as above Psychiatric: There is depression and anxiety.  She is tearful at times. Endocrine: No palpitations, diaphoresis, change in appetite, change in weigh or increased thirst Hematologic/Lymphatic: No anemia, purpura, petechiae. Allergic/Immunologic: No itchy/runny  eyes, nasal congestion, recent allergic reactions, rashes  ALLERGIES: Allergies  Allergen Reactions   Nsaids Other (See Comments)    Not supposed to take d/t bariatric surgery    Ciprofloxacin Rash    Felt like heat/burning rash    HOME MEDICATIONS:  Current Outpatient Medications:    acetaminophen (TYLENOL) 325 MG tablet, Take 650 mg by mouth every 4 (four) hours as needed., Disp: , Rfl:    albuterol (PROVENTIL HFA;VENTOLIN HFA) 108 (90 Base) MCG/ACT inhaler, INHALE 1 TO 2 PUFFS INTO THE LUNGS EVERY 6 HOURS AS NEEDED FOR WHEEZING OR SHORTNESS OF BREATH, Disp: 6.7 g, Rfl: 1   baclofen (LIORESAL) 10 MG tablet, TAKE 1 TABLET(10 MG) BY MOUTH THREE TIMES DAILY, Disp: 270 tablet, Rfl: 3   benzonatate (TESSALON) 200 MG capsule, Take 1 capsule (200 mg total) by mouth 2 (two) times daily as needed for cough., Disp: 30 capsule, Rfl: 0   escitalopram (LEXAPRO) 10 MG tablet, Take 1 tablet (10 mg total) by mouth daily., Disp: 30 tablet, Rfl: 11   gabapentin (NEURONTIN) 100 MG capsule, Take 1 capsule (100 mg total) by mouth 3 (three) times daily., Disp: 90 capsule, Rfl: 2   guaiFENesin-codeine (ROBITUSSIN AC) 100-10 MG/5ML syrup, Take 5-10 mLs by mouth 3 (three) times daily as needed for cough.,  Disp: 118 mL, Rfl: 0   hydrochlorothiazide (MICROZIDE) 12.5 MG capsule, TAKE 1 CAPSULE BY MOUTH  DAILY, Disp: 90 capsule, Rfl: 0   ipratropium (ATROVENT) 0.06 % nasal spray, USE 2 SPRAYS IN EACH NOSTRIL FOUR TIMES DAILY FOR UP TO 5 TO 7 DAYS THEN STOP, Disp: 15 mL, Rfl: 0   lisinopril (PRINIVIL,ZESTRIL) 40 MG tablet, TAKE 1 TABLET BY MOUTH  DAILY, Disp: 90 tablet, Rfl: 0   loratadine (CLARITIN) 10 MG tablet, Take 10 mg by mouth daily., Disp: , Rfl:    MedroxyPROGESTERone Acetate 150 MG/ML SUSY, INJECT 1 ML (150 MG) INTRAMUSCULARLY EVERY 3 MONTHS, Disp: 1 mL, Rfl: 3   modafinil (PROVIGIL) 200 MG tablet, TAKE 1 TABLET BY MOUTH DAILY, Disp: 30 tablet, Rfl: 5   Multiple Vitamin (MULTI VITAMIN DAILY  PO), Take by mouth., Disp: , Rfl:    ocrelizumab 600 mg in sodium chloride 0.9 % 500 mL, Inject 600 mg into the vein once., Disp: , Rfl:    omeprazole (PRILOSEC) 20 MG capsule, Take 20 mg by mouth 2 (two) times daily as needed., Disp: , Rfl:    traMADol (ULTRAM) 50 MG tablet, Take 1 tablet (50 mg total) by mouth 3 (three) times daily as needed., Disp: 90 tablet, Rfl: 1 No current facility-administered medications for this visit.   Facility-Administered Medications Ordered in Other Visits:    gadopentetate dimeglumine (MAGNEVIST) injection 20 mL, 20 mL, Intravenous, Once PRN, Neftali Abair, Pearletha Furl, MD  PAST MEDICAL HISTORY: Past Medical History:  Diagnosis Date   Anemia    Hypertension    Menorrhagia    MS (multiple sclerosis) (HCC)    Shingles    right side of chest and back   Vision abnormalities    Vitamin B 12 deficiency    Vitamin D deficiency     PAST SURGICAL HISTORY: Past Surgical History:  Procedure Laterality Date   CHOLECYSTECTOMY     GASTRIC BYPASS      FAMILY HISTORY: Family History  Problem Relation Age of Onset   Stroke Mother    Heart disease Mother    Asthma Mother    Diabetes Mother    Hypertension Father    Heart disease Father    Diabetes Father    Asthma Brother    Alcoholism Brother    Obesity Brother    Heart disease Brother    Diabetes Maternal Grandmother    Lung cancer Maternal Grandfather    Breast cancer Neg Hx     SOCIAL HISTORY:  Social History   Socioeconomic History   Marital status: Single    Spouse name: Not on file   Number of children: 1   Years of education: Not on file   Highest education level: Not on file  Occupational History   Occupation: Doctor, 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   Food insecurity:    Worry: Not on file    Inability: Not on file   Transportation needs:    Medical: Not on file    Non-medical: Not on file  Tobacco Use   Smoking status:  Never Smoker   Smokeless tobacco: Never Used  Substance and Sexual Activity   Alcohol use: No   Drug use: No   Sexual activity: Yes    Birth control/protection: Injection  Lifestyle   Physical activity:    Days per week: Not on file    Minutes per session: Not on file   Stress: Not on file  Relationships  Social connections:    Talks on phone: Not on file    Gets together: Not on file    Attends religious service: Not on file    Active member of club or organization: Not on file    Attends meetings of clubs or organizations: Not on file    Relationship status: Not on file   Intimate partner violence:    Fear of current or ex partner: Not on file    Emotionally abused: Not on file    Physically abused: Not on file    Forced sexual activity: Not on file  Other Topics Concern   Not on file  Social History Narrative   Remarried in 2018. Has one daughter, one grandchild and 2 step children. Works from home     PHYSICAL EXAM  There were no vitals filed for this visit.  There is no height or weight on file to calculate BMI.   General: The patient is well-developed and well-nourished and in no acute distress   Neck: The neck has good ROM  Neurologic Exam  Mental status: The patient is alert and oriented x 3 at the time of the examination. The patient has apparent normal recent and remote memory, with an apparently normal attention span and concentration ability.   Speech is normal.  Cranial nerves: Extraocular muscles are normal.  Facial strength and sensation is normal.. Hearing is normal and symmetric. Trapezius strength is normal.   Motor:  Muscle bulk is normal.   Muscle tone is normal.  Strength is 5/5 in the arms and legs.  Sensory: Sensory testing is intact to touch and vibration sensation in all 4 extremities.  Coordination: Finger-nose-finger is performed well.Marland Kitchen Heel-to-shin is performed well.  Gait and station: Station is normal.  Her gait is normal.   Tandem gait is mildly wide.. Romberg is negative.   Reflexes: Deep tendon reflexes are symmetric and normal bilaterally.        DIAGNOSTIC DATA (LABS, IMAGING, TESTING) - I reviewed patient records, labs, notes, testing and imaging myself where available.  Lab Results  Component Value Date   WBC 5.9 02/16/2018   HGB 11.3 (L) 02/16/2018   HCT 33.7 (L) 02/16/2018   MCV 90.4 02/16/2018   PLT 268 02/16/2018      Component Value Date/Time   NA 140 01/17/2018 1453   NA 144 06/24/2017 1439   NA 139 06/27/2014 1356   K 4.8 01/17/2018 1453   K 3.9 06/27/2014 1356   CL 110 01/17/2018 1453   CL 106 06/27/2014 1356   CO2 23 01/17/2018 1453   CO2 28 06/27/2014 1356   GLUCOSE 107 (H) 01/17/2018 1453   GLUCOSE 89 06/27/2014 1356   BUN 25 (H) 01/17/2018 1453   BUN 12 06/24/2017 1439   BUN 7 06/27/2014 1356   CREATININE 1.10 (H) 04/16/2018 1420   CREATININE 0.90 10/15/2017 1034   CALCIUM 9.2 01/17/2018 1453   CALCIUM 8.3 (L) 06/27/2014 1356   PROT 6.8 01/17/2018 1453   PROT 6.5 06/24/2017 1439   PROT 6.9 06/27/2014 1356   ALBUMIN 3.9 01/17/2018 1453   ALBUMIN 4.3 06/24/2017 1439   ALBUMIN 3.8 06/27/2014 1356   AST 17 01/17/2018 1453   AST 15 06/27/2014 1356   ALT 14 01/17/2018 1453   ALT 20 06/27/2014 1356   ALKPHOS 82 01/17/2018 1453   ALKPHOS 105 06/27/2014 1356   BILITOT 0.7 01/17/2018 1453   BILITOT 0.2 06/24/2017 1439   BILITOT 0.4 06/27/2014 1356   GFRNONAA  43 (L) 01/17/2018 1453   GFRNONAA 77 10/15/2017 1034   GFRAA 50 (L) 01/17/2018 1453   GFRAA 89 10/15/2017 1034    Lab Results  Component Value Date   VITAMINB12 251 05/28/2015   Lab Results  Component Value Date   TSH 1.910 01/17/2018       Ossie Yebra A. Epimenio Foot, MD, PhD 11/10/2018, 12:43 PM Certified in Neurology, Clinical Neurophysiology, Sleep Medicine, Pain Medicine and Neuroimaging  [facility] [address] [place], [place] [address] [phone]

## 2018-11-16 ENCOUNTER — Other Ambulatory Visit: Payer: Self-pay

## 2018-11-16 ENCOUNTER — Ambulatory Visit (INDEPENDENT_AMBULATORY_CARE_PROVIDER_SITE_OTHER): Payer: 59 | Admitting: Neurology

## 2018-11-16 ENCOUNTER — Telehealth: Payer: Self-pay | Admitting: Neurology

## 2018-11-16 DIAGNOSIS — G4710 Hypersomnia, unspecified: Secondary | ICD-10-CM | POA: Diagnosis not present

## 2018-11-16 DIAGNOSIS — R0683 Snoring: Secondary | ICD-10-CM

## 2018-11-16 DIAGNOSIS — G4719 Other hypersomnia: Secondary | ICD-10-CM

## 2018-11-16 NOTE — Addendum Note (Signed)
Addended by: Hillis Range on: 11/16/2018 02:03 PM   Modules accepted: Orders

## 2018-11-16 NOTE — Telephone Encounter (Signed)
We have approval from pt's insurance for a home sleep study. Please put order in for home sleep test.

## 2018-12-06 NOTE — Progress Notes (Signed)
  Patient Information     First Name: Jody Last Name: Lee ID: [id number]  Birth Date: [birth year] Age: 48 Gender: Female  Referring Doctor:                Avanell Shackleton, NP-C BMI: 50.4 (W=295 lb, H=5' 4'')  Reading Doctor: Despina Arias, MD         ESS: 10/24   Sleep Study Information    Study Date: Nov 22, 2018 S/H/A Version: [phone] / 4.1.1531 / 89  History: She is a 48 year old woman with MS who has snoring with fatigue and sleepiness and some witnessed struggled breathing and gasps at night.    Her Epworth Sleepiness Scale score is 10/24                  Summary & Diagnosis:      1.   No significant OSA with an AHI = 3.3.   The oxygen nadir was 91%  Recommendations:       1.   The snoring and mild upper airway resistance can be treated with weight loss or an oral appliance.   2.   Follow up with Dr. Epimenio Foot  Electronically Signed:                                   Sleep Summary  Oxygen Saturation Statistics   Start Study Time: End Study Time: Total Recording Time:  6:47:06 PM 4:47:06 AM 10 h,  Total Sleep Time % REM of Sleep Time:  6 h, 7 min  10.2%    Mean: 96 Minimum: 91 Maximum: 99  Mean of Desaturations Nadirs (%):   92  Oxygen Desaturation %: 4-9 10-20 >20 Total  Events Number Total  4 100.0  0 0.0  0 0.0  4 100.0  Oxygen Saturation: <90 <=88 <85 <80 <70  Duration (minutes): Sleep % 0.0 0.0 0.0 0.0 0.0 0.0 0.0 0.0 0.0 0.0     Respiratory Indices      Total Events REM NREM All Night  pRDI:  66  pAHI:  20 ODI:  4  pAHIc:  0  % CSR: 0.0 22.7 14.6 3.3 0.0 9.5 2.0 0.4 0.0 10.8 3.3 0.7 0.0       Pulse Rate Statistics during Sleep (BPM)      Mean: 63 Minimum: 49 Maximum: 101    Indices are calculated using technically valid sleep time of  6 hrs, 6 min. pRDI/pAHI are calculated using oxi desaturations ? 3% Body Position Statistics  Position Supine Prone Right Left Non-Supine   Sleep (min) 367.0 0.0 0.0 0.0 0.0  Sleep % 100.0 0.0 0.0 0.0 0.0  pRDI 10.8 N/A N/A N/A N/A  pAHI 3.3 N/A N/A N/A N/A  ODI 0.7 N/A N/A N/A N/A     Snoring Statistics Snoring Level (dB) >40 >50 >60 >70 >80 >Threshold (45)  Sleep (min) 20.6 3.8 1.2 0.0 0.0 7.8  Sleep % 5.6 1.0 0.3 0.0 0.0 2.1    Mean: 40 dB Sleep Stages Chart   * Reference values are according to AASM guidelines

## 2018-12-07 ENCOUNTER — Telehealth: Payer: Self-pay | Admitting: *Deleted

## 2018-12-07 NOTE — Telephone Encounter (Signed)
-----   Message from Asa Lente, MD sent at 12/06/2018  6:03 PM EDT ----- Regarding: home sleep Please let the patient know that the sleep study did not show OSA (she did have some snoring that occasioanlly woke her up and if that is troublesome, we could refer for an oral appliance and weight loss would also help)

## 2018-12-15 ENCOUNTER — Other Ambulatory Visit: Payer: Self-pay | Admitting: Nurse Practitioner

## 2018-12-15 DIAGNOSIS — I10 Essential (primary) hypertension: Secondary | ICD-10-CM

## 2019-01-09 ENCOUNTER — Telehealth: Payer: Self-pay | Admitting: *Deleted

## 2019-01-09 NOTE — Telephone Encounter (Signed)
Received fax notification from [employer] that PA modafinil approved through 07/11/2019. Case number: [id number].

## 2019-01-09 NOTE — Telephone Encounter (Signed)
Submitted PA modafinil 200mg  tab on CMM. Key: [id number]. Waiting on determination.

## 2019-03-07 ENCOUNTER — Other Ambulatory Visit: Payer: Self-pay | Admitting: Neurology

## 2019-03-10 ENCOUNTER — Other Ambulatory Visit: Payer: Self-pay | Admitting: Family Medicine

## 2019-03-10 DIAGNOSIS — I10 Essential (primary) hypertension: Secondary | ICD-10-CM

## 2019-03-24 ENCOUNTER — Other Ambulatory Visit: Payer: Self-pay | Admitting: Neurology

## 2019-03-28 ENCOUNTER — Telehealth: Payer: Self-pay | Admitting: *Deleted

## 2019-03-28 DIAGNOSIS — Z0289 Encounter for other administrative examinations: Secondary | ICD-10-CM

## 2019-03-28 NOTE — Telephone Encounter (Signed)
Gave completed/signed FMLA back to medical records to process for pt. 

## 2019-03-30 ENCOUNTER — Telehealth: Payer: Self-pay | Admitting: *Deleted

## 2019-03-30 NOTE — Telephone Encounter (Signed)
Lvm unable to reach pt. 

## 2019-04-05 NOTE — Telephone Encounter (Signed)
Left Vm unable to reach pt. Pt paperwork @ the front desk for p/u

## 2019-04-27 ENCOUNTER — Other Ambulatory Visit: Payer: Self-pay | Admitting: Neurology

## 2019-04-27 ENCOUNTER — Other Ambulatory Visit: Payer: Self-pay | Admitting: *Deleted

## 2019-04-27 DIAGNOSIS — G35 Multiple sclerosis: Secondary | ICD-10-CM

## 2019-05-02 ENCOUNTER — Telehealth: Payer: Self-pay | Admitting: Neurology

## 2019-05-02 NOTE — Telephone Encounter (Signed)
[place]: [id number] (exp. 05/02/19 to 06/16/19)  Patient is scheduled at [facility] for 05/15/19 arrival time is 2:30 pm. Patient is aware of time and day. I also gave her their number of [phone] in case she needs to r/s.

## 2019-05-12 ENCOUNTER — Ambulatory Visit: Payer: 59 | Admitting: Neurology

## 2019-05-15 ENCOUNTER — Other Ambulatory Visit: Payer: Self-pay

## 2019-05-15 ENCOUNTER — Ambulatory Visit
Admission: RE | Admit: 2019-05-15 | Discharge: 2019-05-15 | Disposition: A | Discharge status: Home / Self Care | Payer: 59 | Source: Ambulatory Visit | Attending: Neurology | Admitting: Neurology

## 2019-05-15 DIAGNOSIS — G35 Multiple sclerosis: Secondary | ICD-10-CM | POA: Insufficient documentation

## 2019-05-15 LAB — POCT I-STAT CREATININE: Creatinine, Ser: 1 mg/dL (ref 0.44–1.00)

## 2019-05-15 MED ORDER — GADOBUTROL 1 MMOL/ML IV SOLN
10.0000 mL | Freq: Once | INTRAVENOUS | Status: AC | PRN
  Administered 2019-05-15: 16:00:00 10 mL via INTRAVENOUS

## 2019-05-16 ENCOUNTER — Telehealth: Payer: Self-pay | Admitting: *Deleted

## 2019-05-16 ENCOUNTER — Ambulatory Visit (INDEPENDENT_AMBULATORY_CARE_PROVIDER_SITE_OTHER): Payer: 59 | Admitting: Neurology

## 2019-05-16 ENCOUNTER — Encounter: Payer: Self-pay | Admitting: Nurse Practitioner

## 2019-05-16 ENCOUNTER — Encounter: Payer: Self-pay | Admitting: Neurology

## 2019-05-16 VITALS — BP 130/85 | HR 77 | Temp 98.6°F | Ht 64.0 in | Wt 316.6 lb

## 2019-05-16 DIAGNOSIS — Z79899 Other long term (current) drug therapy: Secondary | ICD-10-CM | POA: Diagnosis not present

## 2019-05-16 DIAGNOSIS — R200 Anesthesia of skin: Secondary | ICD-10-CM

## 2019-05-16 DIAGNOSIS — R269 Unspecified abnormalities of gait and mobility: Secondary | ICD-10-CM

## 2019-05-16 DIAGNOSIS — G35 Multiple sclerosis: Secondary | ICD-10-CM | POA: Diagnosis not present

## 2019-05-16 DIAGNOSIS — R0683 Snoring: Secondary | ICD-10-CM

## 2019-05-16 DIAGNOSIS — R350 Frequency of micturition: Secondary | ICD-10-CM

## 2019-05-16 DIAGNOSIS — R5383 Other fatigue: Secondary | ICD-10-CM | POA: Diagnosis not present

## 2019-05-16 NOTE — Progress Notes (Addendum)
[facility]  PATIENT: Jody Lee DOB: 25-Oct-1970  REFERRING DOCTOR OR PCP:  Venora Maples SOURCE: patient and labs, notes in EMR and MRI on PACS ________________________________   HISTORICAL  CHIEF COMPLAINT:  Chief Complaint  Patient presents with   Follow-up    RM 12, alone. Last seen 11/10/2018   Multiple Sclerosis    On Ocrevus. Last infusion 03/2019. Next one: 09/2018.     HISTORY OF PRESENT ILLNESS:  Jody Lee is a 48 y.o. woman with relapsing remittingMS diagnosed in 2005.     Update 05/16/2019: She is on Ocrevus for RRMS.   Her last dose was 03/23/2019.   She tolerated it well with no scratchy dose (had that on 1st half dose and 1st full dose).    She feels tired x 1 day.     She is noting some cramps in her leg at night,left > right.   Baclofen, if taken before the cramp helps, but less so if spasms is there.   Muscles most involve dorsiflex the ankle.   Calf is not involved.     She notes some tingling at times.    Gait is good though she has occasional stumbles.   No weakness though grip seems reduced.   Vision is about the same.   She sees ophtho regularly.     Provigil has helped her feel more alert and awake.   We did a PSG but she had only negligible OSA with an AHI = 3.3.   She snores.   The EDS is better on modafinil.    She has gained 60 pounds in 2 years.      She had bariatric surgery in 2003 and lost 150 pounds.     EPWORTH SLEEPINESS SCALE  On a scale of 0 - 3 what is the chance of dozing:  Sitting and Reading:  2    Watching TV:   2 Sitting inactive in a public place: 0 Passenger in car for one hour: 2 Lying down to rest in the afternoon: 1 Sitting and talking to someone: 0 Sitting quietly after lunch:  0 In a car, stopped in traffic:  0  Total (out of 24):   7/24   Not sleepy.     Update 11/10/2018 (video): She was switched to Ocrevus over a year ago.   She had her last infusion in February, 2020.  On Ocrevus she has  not had any exacerbations and feels mostly stable.  Physically, gait is slightly worse with a mild right foot drop if more tired.  She has 2 falls in the living room.  One fall had bruises but no LOC.  She gets cramping in her legs and feet that seems worse.   Cramps are worse during the day.   Baclofen helps some and she tolerates it well.   She notes more hand weakness and some cramping.   Both hands seem mildly weak and sometimes she gets wrist   She has more fatigue and notes decreased focus/attention.   Provigil has helped these symptoms.   Mood is doing well on current medications.  She has some insomnia, getting 4-5 hours sleep many nights.   She snores a little but sometimes has some moaning with struggled breathing and some gasping/snorting.  It sometimes wakes her up.  EPWORTH SLEEPINESS SCALE  On a scale of 0 - 3 what is the chance of dozing:  Sitting and Reading:  2 Watching TV:   2 Sitting  inactive in a public place: 0 Passenger in car for one hour: 3 Lying down to rest in the afternoon: 2 Sitting and talking to someone: 0 Sitting quietly after lunch:  1 In a car, stopped in traffic:  0  Total (10 out of 24):   Mild excesively sleepy    She had several rounds of antibiotics for probable bronchitis in December and January. Steroids were added and she improved.      She works from home but her husband has to work outside and her three kids and one grandchild are in the house      Update 03/24/2018: She had Ocrevus 1st course in February 2019 and just had her second dose 03/18/2018.   She slept through the infusion.      She tolerated it well and she has not had any exacerbation since starting.      Physically she feels baseline.     She is walking well.   If very tired the right foot drops some.    Strength is mildly reduced right worse than left.   Her  grips are mildly weaker.   She denies numbness or dysesthesia.    Vision is ok.     She has left astigmatism.   Colors are  mildly desaturated on left.     Bladder is fine.   She is depressed and stressed with the recent death oif her mother (had heart valve surgery).   She would like to be on an antidepressant.   She has never been on one on the past.     She is tearful, even at work.     While her mom ws in the hospital she was physically and mentally very fatigued (she was hospitalized x 8 weeks).   Before her mom's long hospital stay, fatigue was mostly mild.    Cognition is ok with mikd atentional issues.      Update 09/24/2017: She completed the first 2 halves of the first ocrelizumab infusion last week. She did have some nausea and heartburn the day of the infusion an next day but otherwise felt fine.    Heartburn improved with Zantac.  She denies any new symptoms.   The hand tingling is resolved.      No new weakness.   Her gait is better but will occ stumble.     She can walk a mile.   Vision seems to be stable.   She did need a new prescription.  She has diarrhea since gastric bypass surgery.  She also has anemia and had B12 deficiency.  She has urinary frequency, unchanged in severity.   Modafinil has helped her fatigue and attentional issues.   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   Mood is doing well.    She sleeps well most nights.    She feels cognition is back to baseline.    Update 06/24/2017:   I personally reviewed the results and images of her 06/15/2017 MRI.  She has one enhancing lesion on the MRI of the brain and also has a new lesion that is nonenhancing in the cervical spine.  She notes more muscle stiffness and more right hand tingling the past 2 months.    In detail, we discussed several options for treatment due to her breakthrough while on Gilenya. Specifically we went over the risks and benefits of Tysabri, Lemtrada and ocrelizumab. We will check blood work to determine if any of  these are contraindicated. She is most interested in either Tysabri or  ocrelizumab   Update 05/07/2017: She is on Gilenya for her relapsing multiple sclerosis. She tolerates it well. She has not had any definite exacerbation recently, though one week ago she had one day where she was dizzy the symptoms have since resolved.   When she had the vertigo, she felt her gait was tilted to the one side.     For the most part, gait does well but the right foot drags sometimes.     She denies significant numbness or weakness,    She needs stronger glasses and one day she felt her vision was cloudy for several hours.   She has some urinary frequency and notes bladder pain at times.      In general, fatigue is ok most days but she has occasional times it is worse.   She walks one mile daily as exercise.    She also had one week in August with severe fatigue.  She felt back to baseline the next week.  Mood is doing well.     Provigil has helped her.     After the last visit, she developed shingles. She feels she has completely recovered from the shingles of the right thorax/axilla.    ______________________________ From 10/21/2016  MS:   She is on Gilenya and tolerates it well.     She has not had any definite exacerbation.   She has noted more rightleg spasms.   Spine MRI's showed several chronic plaques in her cervical spine and one in the thoracic spine.,    Gait/strength/sensation:  She sometimes stumbles due to her right foot being slightly weak.    Sometimes she veers while walking.  No falls.      She denies any major problem with weakness or numbness. She has more trouble using a can opener and occasionally is dropping items.     She gets right foot cramps, helped by baclofen..     Bladder: She notes worsening urinary frequency and urgency. She does not have incontinence.   She has fewer spasms.    Vision: She denies any MS related visual problems.   She has new glasses  Fatigue/sleep: She reports physical and cognitive fatigue. She has noted a benefit with Provigil.  Adderall was not well tolerated.     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   She also takes a lot of caffeine on days that she needs to stay up longer or if she is out of her Provigil. She has some difficulty falling asleep but once asleep often stays asleep.    Often she has difficulty turning her mind off.   Her fianc has noted that she moans that her sleep sometimes. She has not had any screaming, kicking or punching.  Mood/cognition: She denies any major difficulty with depression or anxiety at this time. She notes some cognitive difficulty. This is mostly problems with focusing and distraction.  She feels cognition does better with Provigil as well .Marland Kitchen She strays mentally active.  Other: Her anemia is doing better and she gets intermittent iron infusions and B12 monthly.   MS History:   She was diagnosed with MS in 2005 after presenting with a spinal cord syndrome. She started with numbness and pain in the feet that worked its way up to her abdomen over a couple days.   Initially,  she had an MRI of the spine that showed a plaque consistent with her symptoms. She was referred to Dr. Maple Hudson. He did a lumbar puncture and ordered an MRI of the brain. Both were consistent with multiple sclerosis and I started to see her. Initially, she was placed on Betaseron and did very well for the first few years. Then around 2011 or 2012, she had an exacerbation and testing showed that she had developed antibodies against Betaseron. Therefore, she was switched to Gilenya at that time. She has been on Gilenya for the last 4+ years and has done well. Specifically, she has not reported any difficulties with exacerbations and she tolerates the medication well.   Her last MRI was performed at [facility] November 2015.  MRI of the brain from 03/02/2013 showed  typical periventricular T2/FLAIR hyperintense foci, predominantly  in the periventricular white matter.   There were no acute foci.   REVIEW OF SYSTEMS: Constitutional: No fevers, chills, sweats, or change in appetite.   She has fatigue. Eyes: No visual changes, double vision, eye pain Ear, nose and throat: No hearing loss, ear pain, nasal congestion, sore throat Cardiovascular: No chest pain, palpitations Respiratory: No shortness of breath at rest or with exertion.   No wheezes GastrointestinaI: No nausea, vomiting, diarrhea, abdominal pain, fecal incontinence Genitourinary: No dysuria, urinary retention or frequency.  No nocturia. Musculoskeletal: No neck pain, back pain Integumentary: No rash, pruritus, skin lesions Neurological: as above Psychiatric: There is depression and anxiety.  She is tearful at times. Endocrine: No palpitations, diaphoresis, change in appetite, change in weigh or increased thirst Hematologic/Lymphatic: No anemia, purpura, petechiae. Allergic/Immunologic: No itchy/runny eyes, nasal congestion, recent allergic reactions, rashes  ALLERGIES: Allergies  Allergen Reactions   Nsaids Other (See Comments)    Not supposed to take d/t bariatric surgery    Ciprofloxacin Rash    Felt like heat/burning rash    HOME MEDICATIONS:  Current Outpatient Medications:    acetaminophen (TYLENOL) 325 MG tablet, Take 650 mg by mouth every 4 (four) hours as needed., Disp: , Rfl:    baclofen (LIORESAL) 10 MG tablet, TAKE 1 TABLET(10 MG) BY MOUTH THREE TIMES DAILY, Disp: 270 tablet, Rfl: 3   escitalopram (LEXAPRO) 10 MG tablet, TAKE 1 TABLET(10 MG) BY MOUTH DAILY, Disp: 30 tablet, Rfl: 11   gabapentin (NEURONTIN) 100 MG capsule, Take 1 capsule (100 mg total) by mouth 3 (three) times daily., Disp: 90 capsule, Rfl: 2   hydrochlorothiazide (MICROZIDE) 12.5 MG capsule, TAKE 1 CAPSULE BY MOUTH  DAILY, Disp: 30 capsule, Rfl: 0   lisinopril (ZESTRIL) 40 MG tablet, TAKE 1 TABLET BY MOUTH  DAILY, Disp: 30 tablet, Rfl: 0   loratadine  (CLARITIN) 10 MG tablet, Take 10 mg by mouth daily., Disp: , Rfl:    MedroxyPROGESTERone Acetate 150 MG/ML SUSY, INJECT 1 ML (150 MG) INTRAMUSCULARLY EVERY 3 MONTHS, Disp: 1 mL, Rfl: 3   modafinil (PROVIGIL) 200 MG tablet, TAKE 1 TABLET BY MOUTH DAILY, Disp: 30 tablet, Rfl: 5   Multiple Vitamin (MULTI VITAMIN DAILY PO), Take by mouth., Disp: , Rfl:    ocrelizumab 600 mg in sodium chloride 0.9 % 500 mL, Inject 600 mg into the vein once., Disp: , Rfl:    omeprazole (PRILOSEC) 20 MG capsule, Take 20 mg by mouth 2 (two) times daily as needed., Disp: , Rfl:    traMADol (ULTRAM) 50 MG tablet, TAKE 1 TABLET(50 MG) BY MOUTH THREE TIMES DAILY AS NEEDED, Disp: 90 tablet, Rfl: 0  No current facility-administered medications for this visit.   Facility-Administered Medications Ordered in Other Visits:    gadopentetate dimeglumine (MAGNEVIST) injection 20 mL, 20 mL, Intravenous, Once PRN, Dierdre Mccalip, Pearletha Furl, MD  PAST MEDICAL HISTORY: Past Medical History:  Diagnosis Date   Anemia    Hypertension    Menorrhagia    MS (multiple sclerosis) (HCC)    Shingles    right side of chest and back   Vision abnormalities    Vitamin B 12 deficiency    Vitamin D deficiency     PAST SURGICAL HISTORY: Past Surgical History:  Procedure Laterality Date   CHOLECYSTECTOMY     GASTRIC BYPASS      FAMILY HISTORY: Family History  Problem Relation Age of Onset   Stroke Mother    Heart disease Mother    Asthma Mother    Diabetes Mother    Hypertension Father    Heart disease Father    Diabetes Father    Asthma Brother    Alcoholism Brother    Obesity Brother    Heart disease Brother    Diabetes Maternal Grandmother    Lung cancer Maternal Grandfather    Breast cancer Neg Hx     SOCIAL HISTORY:  Social History   Socioeconomic History   Marital status: Single    Spouse name: Not on file   Number of children: 1   Years of education: Not on file   Highest education  level: Not on file  Occupational History   Occupation: Doctor, 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   Food insecurity    Worry: Not on file    Inability: Not on file   Transportation needs    Medical: Not on file    Non-medical: Not on file  Tobacco Use   Smoking status: Never Smoker   Smokeless tobacco: Never Used  Substance and Sexual Activity   Alcohol use: No   Drug use: No   Sexual activity: Yes    Birth control/protection: Injection  Lifestyle   Physical activity    Days per week: Not on file    Minutes per session: Not on file   Stress: Not on file  Relationships   Social connections    Talks on phone: Not on file    Gets together: Not on file    Attends religious service: Not on file    Active member of club or organization: Not on file    Attends meetings of clubs or organizations: Not on file    Relationship status: Not on file   Intimate partner violence    Fear of current or ex partner: Not on file    Emotionally abused: Not on file    Physically abused: Not on file    Forced sexual activity: Not on file  Other Topics Concern   Not on file  Social History Narrative   Remarried in 2018. Has one daughter, one grandchild and 2 step children. Works from home     PHYSICAL EXAM  Vitals:   05/16/19 1542  BP: 130/85  Pulse: 77  Temp: 98.6 F (37 C)  SpO2: 98%  Weight: (!) 316 lb 9.6 oz (143.6 kg)  Height: 5\' 4"  (1.626 m)    Body mass index is 54.34 kg/m.   General: The patient is well-developed and well-nourished and in no acute distress    Neurologic Exam  Mental status: The patient is alert and oriented x 3 at the time  of the examination. The patient has apparent normal recent and remote memory, with an apparently normal attention span and concentration ability.   Speech is normal.  Cranial nerves: Extraocular muscles are normal.  Facial strength and sensation is normal.. Hearing is normal and symmetric.  Trapezius strength is normal.   Motor:  Muscle bulk is normal.   Muscle tone is normal.  Strength is 5/5 in the arms and legs.  Sensory: Sensory testing is intact to touch and vibration sensation in all 4 extremities.  Coordination: Finger-nose-finger and heel-to-shin is performed well.    Gait and station: Station is normal.  Her gait is normal.  Tandem gait is normal.. Romberg is negative.   Reflexes: Deep tendon reflexes are symmetric and normal bilaterally.       Multiple sclerosis (HCC) - Plan: CBC with Differential/Platelet, IgG, IgA, IgM, Hepatic function panel, TSH  High risk medication use - Plan: CBC with Differential/Platelet, IgG, IgA, IgM, Hepatic function panel, TSH  Gait disturbance  Other fatigue  Urinary frequency  Numbness  Snoring  1.   Continue Ocrevus.  We will check lab work today. 2.   Continue Provigil for sleepiness and attention 3.   Continue other medications. 4.   Stay active and exercise as tolerated.  We discussed weight loss. 5.   Return in 6 months or sooner if there are new or worsening neurologic symptoms.   Neli Fofana A. Epimenio FootSater, MD, PhD 05/16/2019, 6:10 PM Certified in Neurology, Clinical Neurophysiology, Sleep Medicine, Pain Medicine and Neuroimaging  [facility] [address], [address] [place], [place] [address] [phone]

## 2019-05-16 NOTE — Telephone Encounter (Signed)
-----   Message from Britt Bottom, MD sent at 05/15/2019  8:52 PM EDT ----- Please let the patient know that the MRI was unchanged

## 2019-05-17 ENCOUNTER — Telehealth: Payer: Self-pay | Admitting: *Deleted

## 2019-05-17 LAB — HEPATIC FUNCTION PANEL
ALT: 11 IU/L (ref 0–32)
AST: 15 IU/L (ref 0–40)
Albumin: 4.3 g/dL (ref 3.8–4.8)
Alkaline Phosphatase: 139 IU/L — ABNORMAL HIGH (ref 39–117)
Bilirubin Total: 0.3 mg/dL (ref 0.0–1.2)
Bilirubin, Direct: 0.11 mg/dL (ref 0.00–0.40)
Total Protein: 6.6 g/dL (ref 6.0–8.5)

## 2019-05-17 LAB — TSH: TSH: 2.71 u[IU]/mL (ref 0.450–4.500)

## 2019-05-17 LAB — CBC WITH DIFFERENTIAL/PLATELET
Basophils Absolute: 0 10*3/uL (ref 0.0–0.2)
Basos: 1 %
EOS (ABSOLUTE): 0 10*3/uL (ref 0.0–0.4)
Eos: 0 %
Hematocrit: 38.7 % (ref 34.0–46.6)
Hemoglobin: 12.7 g/dL (ref 11.1–15.9)
Immature Grans (Abs): 0 10*3/uL (ref 0.0–0.1)
Immature Granulocytes: 0 %
Lymphocytes Absolute: 1.1 10*3/uL (ref 0.7–3.1)
Lymphs: 17 %
MCH: 27.8 pg (ref 26.6–33.0)
MCHC: 32.8 g/dL (ref 31.5–35.7)
MCV: 85 fL (ref 79–97)
Monocytes Absolute: 0.7 10*3/uL (ref 0.1–0.9)
Monocytes: 10 %
Neutrophils Absolute: 4.7 10*3/uL (ref 1.4–7.0)
Neutrophils: 72 %
Platelets: 286 10*3/uL (ref 150–450)
RBC: 4.57 x10E6/uL (ref 3.77–5.28)
RDW: 13.4 % (ref 11.7–15.4)
WBC: 6.5 10*3/uL (ref 3.4–10.8)

## 2019-05-17 LAB — IGG, IGA, IGM
IgA/Immunoglobulin A, Serum: 174 mg/dL (ref 87–352)
IgG (Immunoglobin G), Serum: 807 mg/dL (ref 586–1602)
IgM (Immunoglobulin M), Srm: 117 mg/dL (ref 26–217)

## 2019-05-17 NOTE — Telephone Encounter (Signed)
-----   Message from Britt Bottom, MD sent at 05/17/2019 12:53 PM EDT ----- Please let the patient know that the lab work is fine.

## 2019-06-07 ENCOUNTER — Ambulatory Visit (INDEPENDENT_AMBULATORY_CARE_PROVIDER_SITE_OTHER): Payer: 59 | Admitting: Family Medicine

## 2019-06-07 ENCOUNTER — Other Ambulatory Visit: Payer: Self-pay

## 2019-06-07 ENCOUNTER — Encounter: Payer: Self-pay | Admitting: Family Medicine

## 2019-06-07 DIAGNOSIS — Z20822 Contact with and (suspected) exposure to covid-19: Secondary | ICD-10-CM

## 2019-06-07 DIAGNOSIS — R52 Pain, unspecified: Secondary | ICD-10-CM

## 2019-06-07 DIAGNOSIS — R6889 Other general symptoms and signs: Secondary | ICD-10-CM

## 2019-06-07 DIAGNOSIS — Z20828 Contact with and (suspected) exposure to other viral communicable diseases: Secondary | ICD-10-CM | POA: Diagnosis not present

## 2019-06-07 MED ORDER — XOFLUZA (80 MG DOSE) 2 X 40 MG PO TBPK: 80.0000 mg | ORAL_TABLET | Freq: Once | ORAL | 0 refills | Status: AC

## 2019-06-07 NOTE — Progress Notes (Signed)
Virtual Visit via Telephone The purpose of this virtual visit is to provide medical care while limiting exposure to the novel coronavirus (COVID19) for both patient and office staff.  Consent was obtained for phone visit:  Yes.   Answered questions that patient had about telehealth interaction:  Yes.   I discussed the limitations, risks, security and privacy concerns of performing an evaluation and management service by telephone. I also discussed with the patient that there may be a patient responsible charge related to this service. The patient expressed understanding and agreed to proceed.  Patient Location: Home Provider Location: [facility])  ---------------------------------------------------------------------- Chief Complaint  Patient presents with  . Headache    weakness, nausea, dizzyness, joint pain onset 10 days   PREVIOUS PCP was [name], AGPCNP-BC here at [facility]  S: Reviewed CMA documentation. I have called patient and gathered additional HPI as follows:  FLU LIKE SYNDROME / HEADACHE BODY ACHES Known history of Multiple sclerosis, followed by neurology. Infusion MS therapy in 03/27/19, on for 2 years Received Flu Shot 05/15/19. Did well after asymptomatic. Now acute onset 05/28/19 symptoms onset, then next day and for 10 days - some sinus drainage, body ache, headache, dizziness. Works from home. She called out of work, slept most of day and night still persisetnt symptoms Denies fever, says temp 98.15F - she says this is low grade for her. Admits whole body feels flushed and has sweated. Admits sinus pressure behind R eye - tried OTC cold and flu Dx with Flu in 08/2018, treated with Xofluza and it resolved.  Denies any high risk travel to areas of current concern for COVID19. Denies any known or suspected exposure to person with or possibly with COVID19.  Admits body ache, headache, low grade temp, sinus pressure and pain Denies any cough,  shortness of breath, abdominal pain, diarrhea  Past Medical History:  Diagnosis Date  . Anemia   . Hypertension   . Menorrhagia   . MS (multiple sclerosis) (HCC)   . Shingles    right side of chest and back  . Vision abnormalities   . Vitamin B 12 deficiency   . Vitamin D deficiency    Social History   Tobacco Use  . Smoking status: Never Smoker  . Smokeless tobacco: Never Used  Substance Use Topics  . Alcohol use: No  . Drug use: No    Current Outpatient Medications:  .  acetaminophen (TYLENOL) 325 MG tablet, Take 650 mg by mouth every 4 (four) hours as needed., Disp: , Rfl:  .  baclofen (LIORESAL) 10 MG tablet, TAKE 1 TABLET(10 MG) BY MOUTH THREE TIMES DAILY, Disp: 270 tablet, Rfl: 3 .  escitalopram (LEXAPRO) 10 MG tablet, TAKE 1 TABLET(10 MG) BY MOUTH DAILY, Disp: 30 tablet, Rfl: 11 .  gabapentin (NEURONTIN) 100 MG capsule, Take 1 capsule (100 mg total) by mouth 3 (three) times daily., Disp: 90 capsule, Rfl: 2 .  hydrochlorothiazide (MICROZIDE) 12.5 MG capsule, TAKE 1 CAPSULE BY MOUTH  DAILY, Disp: 30 capsule, Rfl: 0 .  lisinopril (ZESTRIL) 40 MG tablet, TAKE 1 TABLET BY MOUTH  DAILY, Disp: 30 tablet, Rfl: 0 .  loratadine (CLARITIN) 10 MG tablet, Take 10 mg by mouth daily., Disp: , Rfl:  .  MedroxyPROGESTERone Acetate 150 MG/ML SUSY, INJECT 1 ML (150 MG) INTRAMUSCULARLY EVERY 3 MONTHS, Disp: 1 mL, Rfl: 3 .  modafinil (PROVIGIL) 200 MG tablet, TAKE 1 TABLET BY MOUTH DAILY, Disp: 30 tablet, Rfl: 5 .  Multiple Vitamin (  MULTI VITAMIN DAILY PO), Take by mouth., Disp: , Rfl:  .  ocrelizumab 600 mg in sodium chloride 0.9 % 500 mL, Inject 600 mg into the vein once., Disp: , Rfl:  .  omeprazole (PRILOSEC) 20 MG capsule, Take 20 mg by mouth 2 (two) times daily as needed., Disp: , Rfl:  .  traMADol (ULTRAM) 50 MG tablet, TAKE 1 TABLET(50 MG) BY MOUTH THREE TIMES DAILY AS NEEDED, Disp: 90 tablet, Rfl: 0 .  Baloxavir Marboxil,80 MG Dose, (XOFLUZA, 80 MG DOSE,) 2 x 40 MG TBPK, Take 80 mg  by mouth once for 1 dose. For Flu, Disp: 1 each, Rfl: 0 No current facility-administered medications for this visit.   Facility-Administered Medications Ordered in Other Visits:  .  gadopentetate dimeglumine (MAGNEVIST) injection 20 mL, 20 mL, Intravenous, Once PRN, Asa Lente, MD  Depression screen [facility] 2/9 06/07/2019 06/07/2019 09/13/2018  Decreased Interest 0 0 1  Down, Depressed, Hopeless 0 0 1  PHQ - 2 Score 0 0 2  Altered sleeping - - 1  Tired, decreased energy - - 1  Change in appetite - - 0  Feeling bad or failure about yourself  - - 0  Trouble concentrating - - 1  Moving slowly or fidgety/restless - - 0  Suicidal thoughts - - 0  PHQ-9 Score - - 5  Difficult doing work/chores - - Somewhat difficult    GAD 7 : Generalized Anxiety Score 09/13/2018  Nervous, Anxious, on Edge 0  Control/stop worrying 0  Worry too much - different things 1  Trouble relaxing 1  Restless 1  Easily annoyed or irritable 1  Afraid - awful might happen 0  Total GAD 7 Score 4  Anxiety Difficulty Not difficult at all    -------------------------------------------------------------------------- O: No physical exam performed due to remote telephone encounter.  Lab results reviewed.  Recent Results (from the past 2160 hour(s))  I-STAT creatinine     Status: None   Collection Time: 05/15/19  2:59 PM  Result Value Ref Range   Creatinine, Ser 1.00 0.44 - 1.00 mg/dL  CBC with Differential/Platelet     Status: None   Collection Time: 05/16/19  4:26 PM  Result Value Ref Range   WBC 6.5 3.4 - 10.8 x10E3/uL   RBC 4.57 3.77 - 5.28 x10E6/uL   Hemoglobin 12.7 11.1 - 15.9 g/dL   Hematocrit 63.1 49.7 - 46.6 %   MCV 85 79 - 97 fL   MCH 27.8 26.6 - 33.0 pg   MCHC 32.8 31.5 - 35.7 g/dL   RDW 02.6 37.8 - 58.8 %   Platelets 286 150 - 450 x10E3/uL   Neutrophils 72 Not Estab. %   Lymphs 17 Not Estab. %   Monocytes 10 Not Estab. %   Eos 0 Not Estab. %   Basos 1 Not Estab. %   Neutrophils Absolute 4.7  1.4 - 7.0 x10E3/uL   Lymphocytes Absolute 1.1 0.7 - 3.1 x10E3/uL   Monocytes Absolute 0.7 0.1 - 0.9 x10E3/uL   EOS (ABSOLUTE) 0.0 0.0 - 0.4 x10E3/uL   Basophils Absolute 0.0 0.0 - 0.2 x10E3/uL   Immature Granulocytes 0 Not Estab. %   Immature Grans (Abs) 0.0 0.0 - 0.1 x10E3/uL  IgG, IgA, IgM     Status: None   Collection Time: 05/16/19  4:26 PM  Result Value Ref Range   IgG (Immunoglobin G), Serum 807 586 - 1,602 mg/dL   IgA/Immunoglobulin A, Serum 174 87 - 352 mg/dL   IgM (Immunoglobulin M), Srm  117 26 - 217 mg/dL  Hepatic function panel     Status: Abnormal   Collection Time: 05/16/19  4:26 PM  Result Value Ref Range   Total Protein 6.6 6.0 - 8.5 g/dL   Albumin 4.3 3.8 - 4.8 g/dL   Bilirubin Total 0.3 0.0 - 1.2 mg/dL   Bilirubin, Direct 0.11 0.00 - 0.40 mg/dL   Alkaline Phosphatase 139 (H) 39 - 117 IU/L   AST 15 0 - 40 IU/L   ALT 11 0 - 32 IU/L  TSH     Status: None   Collection Time: 05/16/19  4:26 PM  Result Value Ref Range   TSH 2.710 0.450 - 4.500 uIU/mL    -------------------------------------------------------------------------- A&P:  Problem List Items Addressed This Visit    None    Visit Diagnoses    Flu-like symptoms    -  Primary   Relevant Medications   Baloxavir Marboxil,80 MG Dose, (XOFLUZA, 80 MG DOSE,) 2 x 40 MG TBPK   Suspected COVID-19 virus infection       Generalized body aches          Clinically diagnosed influenza or flu like syndrome vs possible COVID, on virtual evaluation today - Duration x 10 days, without complication but unresolved Tolerating PO and well hydrated Possible sinusitis but uncertain if developing or worsening or cause of symptoms based on current history, not consistent - S/p influenza vaccine this season - 2-3 weeks prior to onset symptoms  Plan: 1. Recommend COVID19 testing to be performed ASAP, location recommended [facility] test site [facility]), advised patient info on test site and procedures for testing,  first come first serve, no apt needed.  2. See quarantine recommendations below 3. Out of work until test result, if negative will need work note to return  START EMPIRIC Xofluza 80mg  dose x 2 of 40mg  pills - sent rx to pharmacy Supportive care, OTC  Return criteria given if significant worsening, consider post-influenza complications, otherwise follow-up if needed  If negative COVID and symptoms not resolved w/ Xofluza then consider treat for sinusitis  Advised when to seek care if worse  Meds ordered this encounter  Medications  . Baloxavir Marboxil,80 MG Dose, (XOFLUZA, 80 MG DOSE,) 2 x 40 MG TBPK    Sig: Take 80 mg by mouth once for 1 dose. For Flu    Dispense:  1 each    Refill:  0    Follow-up: - Return in 1 week if not improved  Patient verbalizes understanding with the above medical recommendations including the limitation of remote medical advice.  Specific follow-up and call-back criteria were given for patient to follow-up or seek medical care more urgently if needed.   - Time spent in direct consultation with patient on phone: 9 minutes   Nobie Putnam, [facility] 06/07/2019, 11:41 AM

## 2019-06-07 NOTE — Patient Instructions (Addendum)
TREAT for Flu now with Xofluza first.  Call if need treatment for sinus infection IF NEGATIVE TESTING. Call neurologist as well  You may have coronavirus / [place] Testing Information  I have placed an order in the [facility] system.  All you need to do is arrive at a testing site. No appointment needed.  Hours (Open 8 a.m. - 3:45 p.m.) LAST TEST completed at 3:30pm  [place]: [facility], [address], [place], [place]  [place] Entrance - [place], [place]: [place], [place], [place], [place] (entrance off [address])  [place]: [place]. Main [address], [place], [place] (across from [facility] Emergency Department)  Test result may take 2-7 days to result. You will be notified by MyChart or by Phone.  Phone: [phone] [facility] contact, can inquire about status of test result)  If negative test - they will call you with result. If abnormal or positive test you will be notified as well and our office will contact you to help further with treatment plan.  May take Tylenol as needed for aches pains and fever. Prefer to avoid Ibuprofen if can help it, to avoid complication from virus.  REQUIRED self quarantine to [place] - advised to avoid all exposure with others while during treatment. Should continue to quarantine for up to 7-14 days, pending resolution of symptoms, if symptoms resolve by 7 days and is afebrile >3 days - may STOP self quarantine at that time.  If symptoms do not resolve or significantly improve OR if WORSENING - fever / cough - or worsening shortness of breath - then should contact us and seek advice on next steps in treatment at home vs where/when to seek care at Urgent Care or Hospital ED for further intervention   Please schedule a Follow-up Appointment to: Return today (on 06/07/2019), or if symptoms  worsen or fail to improve, for flu like syndrome.  If you have any other questions or concerns, please feel free to call the office or send a message through [place]. You may also schedule an earlier appointment if necessary.  Additionally, you may be receiving a survey about your experience at our office within a few days to 1 week by e-mail or mail. We value your feedback.  Nobie Putnam, DO [place]

## 2019-06-08 LAB — NOVEL CORONAVIRUS, NAA: SARS-CoV-2, NAA: NOT DETECTED

## 2019-06-14 ENCOUNTER — Telehealth: Payer: Self-pay

## 2019-06-14 NOTE — Telephone Encounter (Signed)
Renewal PA for Modafinil has been sent.  [id number] - PA Case ID: [id number] Waiting on response.

## 2019-06-14 NOTE — Telephone Encounter (Signed)
Received fax notification from [employer] that PA approved through 12/12/19. Case number: [id number].

## 2019-07-11 ENCOUNTER — Encounter: Payer: Self-pay | Admitting: Emergency Medicine

## 2019-07-11 ENCOUNTER — Emergency Department: Payer: 59

## 2019-07-11 ENCOUNTER — Emergency Department
Admission: EM | Admit: 2019-07-11 | Discharge: 2019-07-11 | Disposition: A | Discharge status: Home / Self Care | Payer: 59 | Attending: Emergency Medicine | Admitting: Emergency Medicine

## 2019-07-11 ENCOUNTER — Other Ambulatory Visit: Payer: Self-pay

## 2019-07-11 DIAGNOSIS — I10 Essential (primary) hypertension: Secondary | ICD-10-CM | POA: Diagnosis not present

## 2019-07-11 DIAGNOSIS — M79605 Pain in left leg: Secondary | ICD-10-CM | POA: Diagnosis not present

## 2019-07-11 DIAGNOSIS — G35 Multiple sclerosis: Secondary | ICD-10-CM | POA: Diagnosis not present

## 2019-07-11 DIAGNOSIS — M25552 Pain in left hip: Secondary | ICD-10-CM | POA: Diagnosis not present

## 2019-07-11 DIAGNOSIS — Z79899 Other long term (current) drug therapy: Secondary | ICD-10-CM | POA: Insufficient documentation

## 2019-07-11 MED ORDER — TRAMADOL HCL 50 MG PO TABS: 50.0000 mg | ORAL_TABLET | Freq: Four times a day (QID) | ORAL | 0 refills | Status: DC | PRN

## 2019-07-11 NOTE — Discharge Instructions (Signed)
Follow-up with your primary care provider if any continued problems.  If your knee worsens you may need to see an orthopedist and Dr. Marry Guan is the orthopedist on-call today.  Ice your leg as needed for discomfort.  Also take tramadol as needed for pain.  Take this medication when you eat.

## 2019-07-11 NOTE — ED Triage Notes (Signed)
Pt reports has always had issues with her left knee. Pt reports last week it started hurting worse and states the pain radiates from her left hip down to her left foot. Denies new injuries. Pt called her MD and was told to come to the ED.

## 2019-07-11 NOTE — ED Notes (Signed)
See triage note  Presents with left knee  States knee pain started couple of days ago now pain is moving from hip into ankle  Denies any fall  States she feels like her left leg is swollen   Ambulates with slight limp

## 2019-07-11 NOTE — ED Provider Notes (Signed)
[facility] Emergency Department Provider Note   ____________________________________________   First MD Initiated Contact with Patient 07/11/19 1048     (approximate)  I have reviewed the triage vital signs and the nursing notes.   HISTORY  Chief Complaint Knee Pain, Leg Pain, and Hip Pain   HPI Jody Lee is a 48 y.o. female presents to the ED with complaint of left knee pain.  Patient states that she has had chronic problems off and on with her knee but in the last several days she has had increased pain in her knee along with some pain and swelling of her left lower extremity.  Patient states there is been no injury.  She denies any traveling or history of DVTs.  Patient is a non-smoker.  She does report that she sits a lot at work and that she has gained a lot of weight in the last 2 years.  She states that she was advised by her PCP to come to the ED to be evaluated for DVT.  Patient rates her pain as a 5 out of 10.     Past Medical History:  Diagnosis Date   Anemia    Hypertension    Menorrhagia    MS (multiple sclerosis) (HCC)    Shingles    right side of chest and back   Vision abnormalities    Vitamin B 12 deficiency    Vitamin D deficiency     Patient Active Problem List   Diagnosis Date Noted   Snoring 11/10/2018   Excessive daytime sleepiness 11/10/2018   Depression with anxiety 03/24/2018   High risk medication use 06/24/2017   Numbness 03/20/2016   Urinary frequency 03/20/2016   Morbid obesity with BMI of 40.0-44.9, adult (HCC) 02/05/2016   Shifting sleep-work schedule 09/19/2015   Multiple sclerosis (HCC) 03/26/2015   Other fatigue 03/26/2015   Gait disturbance 03/26/2015   Iron deficiency anemia 02/18/2015   B12 deficiency 01/04/2015   Benign essential hypertension 01/03/2015   LVH (left ventricular hypertrophy) due to hypertensive disease, without heart failure 07/11/2014   Valvular heart disease  07/11/2014   Breath shortness 12/27/2013   Palpitations 12/27/2013   Chest pain 12/27/2013    Past Surgical History:  Procedure Laterality Date   CHOLECYSTECTOMY     GASTRIC BYPASS      Prior to Admission medications   Medication Sig Start Date End Date Taking? Authorizing Provider  acetaminophen (TYLENOL) 325 MG tablet Take 650 mg by mouth every 4 (four) hours as needed.    [provider]  baclofen (LIORESAL) 10 MG tablet TAKE 1 TABLET(10 MG) BY MOUTH THREE TIMES DAILY 04/04/18   Sater, Pearletha Furlichard A, MD  escitalopram (LEXAPRO) 10 MG tablet TAKE 1 TABLET(10 MG) BY MOUTH DAILY 03/07/19   Sater, Pearletha Furlichard A, MD  gabapentin (NEURONTIN) 100 MG capsule Take 1 capsule (100 mg total) by mouth 3 (three) times daily. 02/11/18   Galen ManilaKennedy, Lauren Renee, NP  hydrochlorothiazide (MICROZIDE) 12.5 MG capsule TAKE 1 CAPSULE BY MOUTH  DAILY 03/13/19   Galen ManilaKennedy, Lauren Renee, NP  lisinopril (ZESTRIL) 40 MG tablet TAKE 1 TABLET BY MOUTH  DAILY 03/13/19   Galen ManilaKennedy, Lauren Renee, NP  loratadine (CLARITIN) 10 MG tablet Take 10 mg by mouth daily.    [provider]  MedroxyPROGESTERone Acetate 150 MG/ML SUSY INJECT 1 ML (150 MG) INTRAMUSCULARLY EVERY 3 MONTHS 04/19/17   Farrel ConnersGutierrez, Colleen, CNM  modafinil (PROVIGIL) 200 MG tablet TAKE 1 TABLET BY MOUTH DAILY 03/27/19  Sater, Nanine Means, MD  Multiple Vitamin (MULTI VITAMIN DAILY PO) Take by mouth.    [provider]  ocrelizumab 600 mg in sodium chloride 0.9 % 500 mL Inject 600 mg into the vein once.    [provider]  omeprazole (PRILOSEC) 20 MG capsule Take 20 mg by mouth 2 (two) times daily as needed.    [provider]  traMADol (ULTRAM) 50 MG tablet Take 1 tablet (50 mg total) by mouth every 6 (six) hours as needed. 07/11/19   Johnn Hai, PA-C    Allergies Nsaids and Ciprofloxacin  Family History  Problem Relation Age of Onset   Stroke Mother    Heart disease Mother    Asthma Mother    Diabetes Mother     Hypertension Father    Heart disease Father    Diabetes Father    Asthma Brother    Alcoholism Brother    Obesity Brother    Heart disease Brother    Diabetes Maternal Grandmother    Lung cancer Maternal Grandfather    Breast cancer Neg Hx     Social History Social History   Tobacco Use   Smoking status: Never Smoker   Smokeless tobacco: Never Used  Substance Use Topics   Alcohol use: No   Drug use: No    Review of Systems Constitutional: No fever/chills Cardiovascular: Denies chest pain. Respiratory: Denies shortness of breath. Gastrointestinal: No abdominal pain.  No nausea, no vomiting.  Genitourinary: Negative for dysuria. Musculoskeletal: Positive left leg pain. Skin: Negative for rash. Neurological: Negative for headaches, focal weakness or numbness. ____________________________________________   PHYSICAL EXAM:  VITAL SIGNS: ED Triage Vitals [07/11/19 1020]  Enc Vitals Group     BP (!) 154/83     Pulse Rate (!) 58     Resp 20     Temp 98 F (36.7 C)     Temp Source Oral     SpO2 100 %     Weight (!) 315 lb (142.9 kg)     Height 5\' 4"  (1.626 m)     Head Circumference      Peak Flow      Pain Score 5     Pain Loc      Pain Edu?      Excl. in [place]?    Constitutional: Alert and oriented. Well appearing and in no acute distress.  Morbidly obese. Eyes: Conjunctivae are normal.  Head: Atraumatic. Neck: No stridor.   Cardiovascular: Normal rate, regular rhythm. Grossly normal heart sounds.  Good peripheral circulation. Respiratory: Normal respiratory effort.  No retractions. Lungs CTAB. Gastrointestinal: Soft and nontender. No distention.  Musculoskeletal: On examination of the lower back there is no gross deformity and no point tenderness on palpation of the lumbar vertebral bodies.  No tenderness is noted on palpation of the left hip and patient is able to stand without any assistance.  Patient is able to flex and extend her knee.  There is  some minimal tenderness on palpation of the anterior portion but no effusion is present.  No soft tissue injury present.  Good muscle strength bilaterally. Neurologic:  Normal speech and language. No gross focal neurologic deficits are appreciated. No gait instability. Skin:  Skin is warm, dry and intact. No rash noted. Psychiatric: Mood and affect are normal. Speech and behavior are normal.  ____________________________________________   LABS (all labs ordered are listed, but only abnormal results are displayed)  Labs Reviewed - No data to display  RADIOLOGY  Official radiology report(s): [place] Venous Img Lower Unilateral Left  Result Date: 07/11/2019 CLINICAL DATA:  Left lower extremity pain and edema for the past 3 days. Evaluate for DVT. EXAM: LEFT LOWER EXTREMITY VENOUS DOPPLER ULTRASOUND TECHNIQUE: Gray-scale sonography with graded compression, as well as color Doppler and duplex ultrasound were performed to evaluate the lower extremity deep venous systems from the level of the common femoral vein and including the common femoral, femoral, profunda femoral, popliteal and calf veins including the posterior tibial, peroneal and gastrocnemius veins when visible. The superficial great saphenous vein was also interrogated. Spectral Doppler was utilized to evaluate flow at rest and with distal augmentation maneuvers in the common femoral, femoral and popliteal veins. COMPARISON:  None. FINDINGS: Contralateral Common Femoral Vein: Respiratory phasicity is normal and symmetric with the symptomatic side. No evidence of thrombus. Normal compressibility. Common Femoral Vein: No evidence of thrombus. Normal compressibility, respiratory phasicity and response to augmentation. Saphenofemoral Junction: No evidence of thrombus. Normal compressibility and flow on color Doppler imaging. Profunda Femoral Vein: No evidence of thrombus. Normal compressibility and flow on color Doppler imaging. Femoral Vein: No  evidence of thrombus. Normal compressibility, respiratory phasicity and response to augmentation. Popliteal Vein: No evidence of thrombus. Normal compressibility, respiratory phasicity and response to augmentation. Calf Veins: No evidence of thrombus. Normal compressibility and flow on color Doppler imaging. Superficial Great Saphenous Vein: No evidence of thrombus. Normal compressibility. Venous Reflux:  None. Other Findings:  None. IMPRESSION: No evidence of DVT within the left lower extremity. Electronically Signed   By: Simonne ComeJohn  Watts M.D.   On: 07/11/2019 11:53   Dg Knee Complete 4 Views Left  Result Date: 07/11/2019 CLINICAL DATA:  Left knee pain and swelling for 1 week EXAM: LEFT KNEE - COMPLETE 4+ VIEW COMPARISON:  None. FINDINGS: No evidence of fracture or malalignment. Trace joint effusion. Mild medial compartment joint space narrowing. Soft tissues are unremarkable. IMPRESSION: 1. No acute osseous abnormality. 2. Trace knee joint effusion, nonspecific. Electronically Signed   By: Duanne GuessNicholas  Plundo M.D.   On: 07/11/2019 11:33    ____________________________________________   PROCEDURES  Procedure(s) performed (including Critical Care):  Procedures  ____________________________________________   INITIAL IMPRESSION / ASSESSMENT AND PLAN / ED COURSE  As part of my medical decision making, I reviewed the following data within the electronic MEDICAL RECORD NUMBER Notes from prior ED visits and [place] Controlled Substance Database  48 year old female presents to the ED with complaint of left lower leg pain.  Patient states that she contacted her doctor who told her to come to the ED to be evaluated for a DVT.  Patient states that there is been no injury, she has not traveled, and no previous DVT.  She is a non-smoker.  Patient morbidly obese.  She states that she sits most of the hours that she is employed.  X-ray of the left knee did show a minimal effusion.  Venous ultrasound of the left extremity  was negative for DVT.  Patient was given a prescription for tramadol every 6 hours as needed for pain.  She is to follow-up with her PCP if any continued problems.  She was also encouraged to use ice to her knee and if any continued problems with her knee specifically she is to follow-up with an orthopedist.  ____________________________________________   FINAL CLINICAL IMPRESSION(S) / ED DIAGNOSES  Final diagnoses:  Left leg pain     ED Discharge Orders         Ordered  traMADol (ULTRAM) 50 MG tablet  Every 6 hours PRN     07/11/19 1314           Note:  This document was prepared using Dragon voice recognition software and may include unintentional dictation errors.    Tommi Rumps, PA-C 07/11/19 1444    Dionne Bucy, MD 07/11/19 [phone]

## 2019-08-15 ENCOUNTER — Other Ambulatory Visit: Payer: Self-pay | Admitting: Neurology

## 2019-08-15 DIAGNOSIS — B029 Zoster without complications: Secondary | ICD-10-CM

## 2019-08-15 MED ORDER — TRAMADOL HCL 50 MG PO TABS: ORAL_TABLET | ORAL | 0 refills | Status: DC

## 2019-08-15 MED ORDER — GABAPENTIN 100 MG PO CAPS: 100.0000 mg | ORAL_CAPSULE | Freq: Three times a day (TID) | ORAL | 2 refills | Status: DC

## 2019-09-19 NOTE — Telephone Encounter (Signed)
Gave completed/signed FMLA form back to medical records to process for pt. 

## 2019-09-26 ENCOUNTER — Other Ambulatory Visit: Payer: Self-pay | Admitting: Neurology

## 2019-10-23 ENCOUNTER — Ambulatory Visit
Admission: RE | Admit: 2019-10-23 | Discharge: 2019-10-23 | Disposition: A | Discharge status: Home / Self Care | Payer: 59 | Source: Ambulatory Visit | Attending: Family Medicine | Admitting: Family Medicine

## 2019-10-23 ENCOUNTER — Ambulatory Visit (INDEPENDENT_AMBULATORY_CARE_PROVIDER_SITE_OTHER): Payer: 59 | Admitting: Family Medicine

## 2019-10-23 ENCOUNTER — Other Ambulatory Visit: Payer: Self-pay

## 2019-10-23 ENCOUNTER — Other Ambulatory Visit: Payer: Self-pay | Admitting: Family Medicine

## 2019-10-23 ENCOUNTER — Encounter: Payer: Self-pay | Admitting: Family Medicine

## 2019-10-23 VITALS — BP 120/56 | HR 64 | Temp 97.3°F | Resp 18 | Ht 64.0 in | Wt 318.0 lb

## 2019-10-23 DIAGNOSIS — J690 Pneumonitis due to inhalation of food and vomit: Secondary | ICD-10-CM

## 2019-10-23 DIAGNOSIS — R059 Cough, unspecified: Secondary | ICD-10-CM

## 2019-10-23 DIAGNOSIS — R05 Cough: Secondary | ICD-10-CM

## 2019-10-23 DIAGNOSIS — K219 Gastro-esophageal reflux disease without esophagitis: Secondary | ICD-10-CM | POA: Diagnosis not present

## 2019-10-23 MED ORDER — PREDNISONE 20 MG PO TABS: 20.0000 mg | ORAL_TABLET | Freq: Two times a day (BID) | ORAL | 0 refills | Status: AC

## 2019-10-23 MED ORDER — PREDNISONE 20 MG PO TABS: 20.0000 mg | ORAL_TABLET | Freq: Two times a day (BID) | ORAL | 0 refills | Status: DC

## 2019-10-23 MED ORDER — AMOXICILLIN-POT CLAVULANATE 875-125 MG PO TABS: 1.0000 | ORAL_TABLET | Freq: Two times a day (BID) | ORAL | 0 refills | Status: DC

## 2019-10-23 NOTE — Assessment & Plan Note (Signed)
Based on history and physical today, likely aspiration on 10/22/2019 with early pneumonia.  Will xray for baseline imaging and begin empirically on Augmentin 875/125mg  1 tablet 2x a day for the next 10 days along with Prednisone 40mg  daily x 5 days.    Plan: 1) Begin antibiotics and steroids as directed 2) Complete chest xray ordered today and we will contact with results 3) Any worsening of symptoms, fevers, shortness of breath, etc, to proceed to ER as discussed.

## 2019-10-23 NOTE — Assessment & Plan Note (Signed)
See Cough A/P 

## 2019-10-23 NOTE — Patient Instructions (Signed)
As we discussed, based on your physical exam and your reported history, likely aspiration from 10/22/2019 with early pneumonia.  We have taken chest xrays today and will be in touch once we have the results back from the radiologist.  I have sent in a prescription for Augmentin 875/125 to take 1 tablet 2x a day for the next 10 days along with Prednisone to take over the next 5 days.  If you have any worsening of symptoms, worsening cough, shortness of breath, fever, generalized not feeling well, proceed to the EMERGENCY ROOM immediately!  We will plan to see you back as needed for this.  You will receive a survey after today's visit either digitally by e-mail or paper by [employer]. Your experiences and feedback matter to [place].  Please respond so we know how we are doing as we provide care for you.  Call us with any questions/concerns/needs.  It is my goal to be available to you for your health concerns.  Thanks for choosing me to be a partner in your healthcare needs!  Charlaine Dalton, FNP-C Family Nurse Practitioner [facility] Phone: [phone]

## 2019-10-23 NOTE — Progress Notes (Signed)
Updated Rx to [facility] in [place] and not under the mail order pharmacy in chart.  Updated and completed.

## 2019-10-23 NOTE — Progress Notes (Signed)
Chest xray is negative, will still plan to treat her based on her exam and history with the Augmentin and Prednisone that I had sent to the pharmacy this morning.

## 2019-10-23 NOTE — Progress Notes (Signed)
Subjective:    Patient ID: Jody Lee, female    DOB: Nov 26, 1970, 49 y.o.   MRN: [id number]  Jody Lee is a 49 y.o. female presenting on 10/23/2019 for Aspiration (the pt state after eating yesterday she started to feel sick and vomited. While vomiting she aspirated and now shes experiencing some coughing w/ rattling and sore throat. )   HPI  Jody Lee presents to clinic for concerns of cough x 1 day.  States had gastric bypass surgery in 2003 and since then if she eats anything buttery or greasy she has heaviness in her esophagus.  This happened last night and she made herself vomit to relieve the feeling but believes she had aspirated on her vomit.  Has had a cough and sore throat since last night, feels "rattling" in her chest.  Denies fevers, SOB, difficulty taking a deep breath, CP, abdominal pain.  Has not taken anything for her symptoms.  Depression screen [facility] 2/9 06/07/2019 06/07/2019 09/13/2018  Decreased Interest 0 0 1  Down, Depressed, Hopeless 0 0 1  PHQ - 2 Score 0 0 2  Altered sleeping - - 1  Tired, decreased energy - - 1  Change in appetite - - 0  Feeling bad or failure about yourself  - - 0  Trouble concentrating - - 1  Moving slowly or fidgety/restless - - 0  Suicidal thoughts - - 0  PHQ-9 Score - - 5  Difficult doing work/chores - - Somewhat difficult    Social History   Tobacco Use  . Smoking status: Never Smoker  . Smokeless tobacco: Never Used  Substance Use Topics  . Alcohol use: No  . Drug use: No    Review of Systems  Constitutional: Negative.   HENT: Positive for sore throat. Negative for congestion, dental problem, drooling, ear discharge, ear pain, facial swelling, hearing loss, mouth sores, nosebleeds, postnasal drip, rhinorrhea, sinus pressure, sinus pain, sneezing, tinnitus, trouble swallowing and voice change.   Eyes: Negative.   Respiratory: Positive for cough. Negative for apnea, choking, chest tightness, shortness of breath, wheezing and  stridor.   Cardiovascular: Negative.   Gastrointestinal: Positive for vomiting. Negative for abdominal distention, abdominal pain, anal bleeding, blood in stool, constipation, diarrhea, nausea and rectal pain.  Endocrine: Negative.   Genitourinary: Negative.   Musculoskeletal: Negative.   Skin: Negative.   Allergic/Immunologic: Negative.   Neurological: Negative.   Hematological: Negative.   Psychiatric/Behavioral: Negative.    Per HPI unless specifically indicated above     Objective:    BP (!) 120/56 (BP Location: Left Arm, Patient Position: Sitting, Cuff Size: Normal)   Pulse 64   Temp (!) 97.3 F (36.3 C) (Temporal)   Resp 18   Ht 5\' 4"  (1.626 m)   Wt (!) 318 lb (144.2 kg)   SpO2 100%   BMI 54.58 kg/m   Wt Readings from Last 3 Encounters:  10/23/19 (!) 318 lb (144.2 kg)  07/11/19 (!) 315 lb (142.9 kg)  05/16/19 (!) 316 lb 9.6 oz (143.6 kg)    Physical Exam Vitals reviewed.  Constitutional:      General: She is not in acute distress.    Appearance: Normal appearance. She is well-groomed. She is obese. She is not ill-appearing or toxic-appearing.  HENT:     Head: Normocephalic.  Eyes:     General: Lids are normal. Vision grossly intact.        Right eye: No discharge.  Left eye: No discharge.     Extraocular Movements: Extraocular movements intact.     Conjunctiva/sclera: Conjunctivae normal.     Pupils: Pupils are equal, round, and reactive to light.  Cardiovascular:     Rate and Rhythm: Normal rate and regular rhythm.     Pulses: Normal pulses.     Heart sounds: Normal heart sounds. No murmur. No friction rub. No gallop.   Pulmonary:     Effort: Pulmonary effort is normal. No respiratory distress.     Breath sounds: No stridor. Examination of the right-lower field reveals decreased breath sounds. Examination of the left-lower field reveals decreased breath sounds. Decreased breath sounds present. No wheezing, rhonchi or rales.     Comments: Slightly  diminished breath sounds at bases bilaterally Abdominal:     General: Bowel sounds are normal.     Palpations: Abdomen is soft.     Tenderness: There is no abdominal tenderness.  Musculoskeletal:        General: Normal range of motion.  Skin:    General: Skin is warm and dry.     Capillary Refill: Capillary refill takes less than 2 seconds.  Neurological:     General: No focal deficit present.     Mental Status: She is alert and oriented to person, place, and time.     Cranial Nerves: No cranial nerve deficit.     Sensory: No sensory deficit.     Motor: No weakness.     Coordination: Coordination normal.     Gait: Gait normal.  Psychiatric:        Attention and Perception: Attention and perception normal.        Mood and Affect: Mood and affect normal.        Speech: Speech normal.        Behavior: Behavior normal. Behavior is cooperative.        Thought Content: Thought content normal.        Cognition and Memory: Cognition and memory normal.        Judgment: Judgment normal.     Results for orders placed or performed in visit on 06/07/19  Novel Coronavirus, NAA ([facility])   Specimen: Oropharyngeal(OP) collection in vial transport medium   OROPHARYNGEA  TESTING  Result Value Ref Range   SARS-CoV-2, NAA Not Detected Not Detected      Assessment & Plan:   Problem List Items Addressed This Visit      Respiratory   Aspiration pneumonia ([place])    See Cough A/P      Relevant Medications   amoxicillin-clavulanate (AUGMENTIN) 875-125 MG tablet   predniSONE (DELTASONE) 20 MG tablet     Other   Cough - Primary    Based on history and physical today, likely aspiration on 10/22/2019 with early pneumonia.  Will xray for baseline imaging and begin empirically on Augmentin 875/125mg  1 tablet 2x a day for the next 10 days along with Prednisone 40mg  daily x 5 days.    Plan: 1) Begin antibiotics and steroids as directed 2) Complete chest xray ordered today and we will contact  with results 3) Any worsening of symptoms, fevers, shortness of breath, etc, to proceed to ER as discussed.      Relevant Orders   DG Chest 2 View    Other Visit Diagnoses    Gastroesophageal reflux disease, unspecified whether esophagitis present       Relevant Orders   Ambulatory referral to Gastroenterology      Meds ordered  this encounter  Medications  . amoxicillin-clavulanate (AUGMENTIN) 875-125 MG tablet    Sig: Take 1 tablet by mouth 2 (two) times daily.    Dispense:  20 tablet    Refill:  0  . predniSONE (DELTASONE) 20 MG tablet    Sig: Take 1 tablet (20 mg total) by mouth 2 (two) times daily with a meal for 5 days.    Dispense:  10 tablet    Refill:  0      Follow up plan: Return if symptoms worsen or fail to improve.   Charlaine Dalton, FNP Family Nurse Practitioner [facility] 10/23/2019, 9:42 AM

## 2019-10-30 ENCOUNTER — Encounter: Payer: Self-pay | Admitting: Gastroenterology

## 2019-10-30 ENCOUNTER — Telehealth: Payer: Self-pay | Admitting: Gastroenterology

## 2019-10-30 NOTE — Telephone Encounter (Signed)
Pt left vm  Returning a call regarding questions on her virtual call for tomorrow

## 2019-10-30 NOTE — Telephone Encounter (Signed)
Called and left a message for call back  

## 2019-10-31 ENCOUNTER — Ambulatory Visit (INDEPENDENT_AMBULATORY_CARE_PROVIDER_SITE_OTHER): Payer: 59 | Admitting: Gastroenterology

## 2019-10-31 ENCOUNTER — Other Ambulatory Visit: Payer: Self-pay

## 2019-10-31 ENCOUNTER — Encounter: Payer: Self-pay | Admitting: Gastroenterology

## 2019-10-31 ENCOUNTER — Telehealth: Payer: Self-pay

## 2019-10-31 DIAGNOSIS — R748 Abnormal levels of other serum enzymes: Secondary | ICD-10-CM | POA: Diagnosis not present

## 2019-10-31 DIAGNOSIS — R1310 Dysphagia, unspecified: Secondary | ICD-10-CM | POA: Diagnosis not present

## 2019-10-31 DIAGNOSIS — R112 Nausea with vomiting, unspecified: Secondary | ICD-10-CM

## 2019-10-31 MED ORDER — OMEPRAZOLE 20 MG PO CPDR: 20.0000 mg | DELAYED_RELEASE_CAPSULE | Freq: Every day | ORAL | 0 refills | Status: DC

## 2019-10-31 NOTE — Progress Notes (Signed)
Jody Lee [address]  [place]  [place], Jody Lee [address]  Main: [phone]  Fax: [phone]   Gastroenterology Consultation  Referring Provider:     Verl Bangs, FNP Primary Care Physician:  Mikey College, NP (Inactive) Reason for Consultation:     GERD        HPI:   Virtual Visit via Video Note  I connected with patient on 10/31/19 at 10:45 AM EDT by video ([email or web address]) and verified that I am speaking with the correct person using two identifiers.   I discussed the limitations, risks, security and privacy concerns of performing an evaluation and management service by video and the availability of in person appointments. I also discussed with the patient that there may be a patient responsible charge related to this service. The patient expressed understanding and agreed to proceed.  Location of the patient: Home Location of provider: Home Participating persons: Patient and provider only (Nursing staff checked in patient via phone but were not physically involved in the video interaction - see their notes)   History of Present Illness: Chief Complaint  Patient presents with  . New Patient (Initial Visit)  . Gastroesophageal Reflux    Patient states sometimes when she eats it sits in her chest. Patient has some nausea   . Bariatric surgery    Had it in 2016 and they did 2 ballon endoscopy     Jody Lee is a 50 y.o. y/o female referred for consultation & management  by Dr. Merrilyn Puma, Jerrel Ivory, NP (Inactive).  Patient reports history of Roux-en-Y gastric bypass in 2003.  States during the admission for the surgery, she started having nausea vomiting post procedure and had to have endoscopy with dilation done during the hospitalization.  3 to 4-week after the surgery, she had to see a GI as an outpatient due to ongoing symptoms and had another dilation done.  She was admitted to [facility] in [place] at the time for her surgery.   Since then she has continued to have the symptoms intermittently and as of recently the symptoms have been occurring once a week.  She states she feels solid food gets stuck in her lower chest and she has to vomit it to feel better.  No weight loss.  States she chews her food well.  No blood in stool or altered bowel habits.  No EGD since 2003.  The only other surgery she has had since then is a cholecystectomy  Past Medical History:  Diagnosis Date  . Anemia   . Hypertension   . Menorrhagia   . MS (multiple sclerosis) ([place])   . Shingles    right side of chest and back  . Vision abnormalities   . Vitamin B 12 deficiency   . Vitamin D deficiency     Past Surgical History:  Procedure Laterality Date  . CHOLECYSTECTOMY    . GASTRIC BYPASS      Prior to Admission medications   Medication Sig Start Date End Date Taking? Authorizing Provider  acetaminophen (TYLENOL) 325 MG tablet Take 650 mg by mouth every 4 (four) hours as needed.   Yes [provider]  baclofen (LIORESAL) 10 MG tablet TAKE 1 TABLET(10 MG) BY MOUTH THREE TIMES DAILY 04/04/18  Yes Sater, Nanine Means, MD  escitalopram (LEXAPRO) 10 MG tablet TAKE 1 TABLET(10 MG) BY MOUTH DAILY Patient taking differently: Take 10 mg by mouth daily as needed.  03/07/19  Yes Sater, Nanine Means,  MD  gabapentin (NEURONTIN) 100 MG capsule Take 1 capsule (100 mg total) by mouth 3 (three) times daily. 08/15/19  Yes Sater, Nanine Means, MD  loratadine (CLARITIN) 10 MG tablet Take 10 mg by mouth daily.   Yes [provider]  modafinil (PROVIGIL) 200 MG tablet TAKE 1 TABLET BY MOUTH DAILY 09/26/19  Yes Sater, Nanine Means, MD  Multiple Vitamin (MULTI VITAMIN DAILY PO) Take by mouth.   Yes [provider]  ocrelizumab 600 mg in sodium chloride 0.9 % 500 mL Inject 600 mg into the vein once.   Yes [provider]  omeprazole (PRILOSEC) 20 MG capsule Take 20 mg by mouth 2 (two) times daily as needed.   Yes [provider]    traMADol (ULTRAM) 50 MG tablet 1 pill po bid prn 08/15/19  Yes Sater, Nanine Means, MD  hydrochlorothiazide (MICROZIDE) 12.5 MG capsule TAKE 1 CAPSULE BY MOUTH  DAILY Patient not taking: Reported on 10/23/2019 03/13/19   Mikey College, NP  lisinopril (ZESTRIL) 40 MG tablet TAKE 1 TABLET BY MOUTH  DAILY Patient not taking: Reported on 10/23/2019 03/13/19   Mikey College, NP    Family History  Problem Relation Age of Onset  . Stroke Mother   . Heart disease Mother   . Asthma Mother   . Diabetes Mother   . Hypertension Father   . Heart disease Father   . Diabetes Father   . Asthma Brother   . Alcoholism Brother   . Obesity Brother   . Heart disease Brother   . Diabetes Maternal Grandmother   . Lung cancer Maternal Grandfather   . Breast cancer Neg Hx      Social History   Tobacco Use  . Smoking status: Never Smoker  . Smokeless tobacco: Never Used  Substance Use Topics  . Alcohol use: No  . Drug use: No    Allergies as of 10/31/2019 - Review Complete 10/30/2019  Allergen Reaction Noted  . Nsaids Other (See Comments) 12/25/2014  . Ciprofloxacin Rash 12/25/2014    Review of Systems:    All systems reviewed and negative except where noted in HPI.   Observations/Objective:  Labs: CBC    Component Value Date/Time   WBC 6.5 05/16/2019 1626   WBC 5.9 02/16/2018 1131   RBC 4.57 05/16/2019 1626   RBC 3.73 (L) 02/16/2018 1131   HGB 12.7 05/16/2019 1626   HCT 38.7 05/16/2019 1626   PLT 286 05/16/2019 1626   MCV 85 05/16/2019 1626   MCV 83 09/07/2014 0810   MCH 27.8 05/16/2019 1626   MCH 30.3 02/16/2018 1131   MCHC 32.8 05/16/2019 1626   MCHC 33.5 02/16/2018 1131   RDW 13.4 05/16/2019 1626   RDW 23.5 (H) 09/07/2014 0810   LYMPHSABS 1.1 05/16/2019 1626   LYMPHSABS 0.2 (L) 09/07/2014 0810   MONOABS 0.6 02/16/2018 1131   MONOABS 0.4 09/07/2014 0810   EOSABS 0.0 05/16/2019 1626   EOSABS 0.0 09/07/2014 0810   BASOSABS 0.0 05/16/2019 1626   BASOSABS 0.0  09/07/2014 0810   CMP     Component Value Date/Time   NA 140 01/17/2018 1453   NA 144 06/24/2017 1439   NA 139 06/27/2014 1356   K 4.8 01/17/2018 1453   K 3.9 06/27/2014 1356   CL 110 01/17/2018 1453   CL 106 06/27/2014 1356   CO2 23 01/17/2018 1453   CO2 28 06/27/2014 1356   GLUCOSE 107 (H) 01/17/2018 1453   GLUCOSE 89 06/27/2014 1356  BUN 25 (H) 01/17/2018 1453   BUN 12 06/24/2017 1439   BUN 7 06/27/2014 1356   CREATININE 1.00 05/15/2019 1459   CREATININE 0.90 10/15/2017 1034   CALCIUM 9.2 01/17/2018 1453   CALCIUM 8.3 (L) 06/27/2014 1356   PROT 6.6 05/16/2019 1626   PROT 6.9 06/27/2014 1356   ALBUMIN 4.3 05/16/2019 1626   ALBUMIN 3.8 06/27/2014 1356   AST 15 05/16/2019 1626   AST 15 06/27/2014 1356   ALT 11 05/16/2019 1626   ALT 20 06/27/2014 1356   ALKPHOS 139 (H) 05/16/2019 1626   ALKPHOS 105 06/27/2014 1356   BILITOT 0.3 05/16/2019 1626   BILITOT 0.4 06/27/2014 1356   GFRNONAA 43 (L) 01/17/2018 1453   GFRNONAA 77 10/15/2017 1034   GFRAA 50 (L) 01/17/2018 1453   GFRAA 89 10/15/2017 1034    Imaging Studies: DG Chest 2 View  Result Date: 10/23/2019 CLINICAL DATA:  Cough and congestion after vomiting EXAM: CHEST - 2 VIEW COMPARISON:  September 13, 2018 FINDINGS: The lungs are clear. The heart size and pulmonary vascularity are normal. No adenopathy. No pneumothorax. No bone lesions. IMPRESSION: Lungs clear.  Cardiac silhouette within normal limits. Electronically Signed   By: Lowella Grip III M.D.   On: 10/23/2019 10:41    Assessment and Plan:   Jody Lee is a 49 y.o. y/o female has been referred for nausea vomiting  Assessment and Plan: Patient reports that she had endoscopic dilation done after her Roux-en-Y gastric bypass in 2003.  She had nausea vomiting after her surgery.  From the sounds of it, she may have had anastomotic dilation post surgery and this may be the culprit with her ongoing symptoms since then.  However, she is reporting some dysphagia  as well.  EGD indicated for further evaluation of both her esophagus and anastomosis site.  However, will also try omeprazole for 2 to 3 weeks to see if it helps her symptoms. .(Risks of PPI use were discussed with patient including bone loss, C. Diff diarrhea, pneumonia, infections, CKD, electrolyte abnormalities.  If clinically possible based on symptoms, goal would be to maintain patient on the lowest dose possible, or discontinue the medication with institution of acid reflux lifestyle modifications over time. Pt. Verbalizes understanding and chooses to continue the medication.)  We will try to obtain her 2003 records in the meantime  Mildly elevated alk phos in oct 2020. Repeat along with GGT  Screening colonoscopy with risks and benefits discussed.  Patient not interested in scheduling screening colonoscopy at this time  Follow Up Instructions:   I discussed the assessment and treatment plan with the patient. The patient was provided an opportunity to ask questions and all were answered. The patient agreed with the plan and demonstrated an understanding of the instructions.   The patient was advised to call back or seek an in-person evaluation if the symptoms worsen or if the condition fails to improve as anticipated.  I provided 15 minutes of face-to-face time via video software during this encounter.  Additional time was spent in reviewing patient's chart, placing orders etc.   Virgel Manifold, MD  Speech recognition software was used to dictate the above note. Marland Kitchen

## 2019-10-31 NOTE — Telephone Encounter (Signed)
Per Dr. Maximino Greenland We need to get her old records from [facility] in [place] from 2003 in regard to her bariatric surgery - Dr. Jayme Cloud was her surgeon   Patient states that doctor is not in practice anymore and does not know the practice that she had this done with. Patient states there would be no use in signing a release form to get the records

## 2019-10-31 NOTE — Patient Instructions (Signed)
Dysphagia Eating Plan, Bite Size Food This diet plan is for people with moderate swallowing problems who have transitioned from pureed and minced foods. Bite size foods are soft and cut into small chunks so that they can be swallowed safely. On this eating plan, you may be instructed to drink liquids that are thickened. Work with your health care provider and your diet and nutrition specialist (dietitian) to make sure that you are following the diet safely and getting all the nutrients you need. What are tips for following this plan? General guidelines for foods   You may eat foods that are tender, soft, and moist.  Always test food texture before taking a bite. Poke food with a fork or spoon to make sure it is tender.  Food should be easy to cut and shew. Avoid large pieces of food that require a lot of chewing.  Take small bites. Each bite should be smaller than your thumb nail (about 15mm by 15 mm).  If you were on pureed and minced food diet plans, you may eat any of the foods included in those diets.  Avoid foods that are very dry, hard, sticky, chewy, coarse, or crunchy.  If instructed by your health care provider, thicken liquids. Follow your health care provider's instructions for what products to use, how to do this, and to what thickness. ? Your health care provider may recommend using a commercial thickener, rice cereal, or potato flakes. Ask your health care provider to recommend thickeners. ? Thickened liquids are usually a "pudding-like" consistency, or they may be as thick as honey or thick enough to eat with a spoon. Cooking  To moisten foods, you may add liquids while you are blending, mashing, or grinding your foods to the right consistency. These liquids include gravies, sauces, vegetable or fruit juice, milk, half and half, or water.  Strain extra liquid from foods before eating.  Reheat foods slowly to prevent a tough crust from forming.  Prepare foods in advance.  Meal planning  Eat a variety of foods to get all the nutrients you need.  Some foods may be tolerated better than others. Work with your dietitian to identify which foods are safest for you to eat.  Follow your meal plan as told by your dietitian. What foods are allowed? Grains Moist breads without nuts or seeds. Biscuits, muffins, pancakes, and waffles that are well-moistened with syrup, jelly, margarine, or butter. Cooked cereals. Moist bread stuffing. Moist rice. Well-moistened cold cereal with small chunks. Well-cooked pasta, noodles, rice, and bread dressing in small pieces and thick sauce. Soft dumplings or spaetzle in small pieces and butter or gravy. Vegetables Soft, well-cooked vegetables in small pieces. Soft-cooked, mashed potatoes. Thickened vegetable juice. Fruits Canned or cooked fruits that are soft or moist and do not have skin or seeds. Fresh, soft bananas. Thickened fruit juices. Meat and other protein foods Tender, moist meats or poultry in small pieces. Moist meatballs or meatloaf. Fish without bones. Eggs or egg substitutes in small pieces. Tofu. Tempeh and meat alternatives in small pieces. Well-cooked, tender beans, peas, baked beans, and other legumes. Dairy Thickened milk. Cream cheese. Yogurt. Cottage cheese. Sour cream. Small pieces of soft cheese. Fats and oils Butter. Oils. Margarine. Mayonnaise. Gravy. Spreads. Sweets and desserts Soft, smooth, moist desserts. Pudding. Custard. Moist cakes. Jam. Jelly. Honey. Preserves. Ask your health care provider whether you can have frozen desserts. Seasoning and other foods All seasonings and sweeteners. All sauces with small chunks. Prepared tuna, egg, or chicken   salad without raw fruits or vegetables. Moist casseroles with small, tender pieces of meat. Soups with tender meat. What foods are not allowed? Grains Coarse or dry cereals. Dry breads. Toast. Crackers. Tough, crusty breads, such as French bread and baguettes.  Dry pancakes, waffles, and muffins. Sticky rice. Dry bread stuffing. Granola. Popcorn. Chips. Vegetables All raw vegetables. Cooked corn. Rubbery or stiff cooked vegetables. Stringy vegetables, such as celery. Tough, crisp fried potatoes. Potato skins. Fruits Hard, crunchy, stringy, high-pulp, and juicy raw fruits such as apples, pineapple, papaya, and watermelon. Small, round fruits, such as grapes. Dried fruit and fruit leather. Meat and other protein foods Large pieces of meat. Dry, tough meats, such as bacon, sausage, and hot dogs. Chicken, turkey, or fish with skin and bones. Crunchy peanut butter. Nuts. Seeds. Nut and seed butters. Dairy Yogurt with nuts, seeds, or large chunks. Large chunks of cheese. Frozen desserts and milk consistency not allowed by your dietitian. Sweets and desserts Dry cakes. Chewy or dry cookies. Any desserts with nuts, seeds, dry fruits, coconut, pineapple, or anything dry, sticky, or hard. Chewy caramel. Licorice. Taffy-type candies. Ask your health care provider whether you can have frozen desserts. Seasoning and other foods Soups with tough or large chunks of meats, poultry, or vegetables. Corn or clam chowder. Smoothies with large chunks of fruit. Summary  Bite size foods can be helpful for people with moderate swallowing problems.  On the dysphagia eating plan, you may eat foods that are soft, moist, and cut into pieces smaller than 15mm by 15mm.  You may be instructed to thicken liquids. Follow your health care provider's instructions about how to do this and to what consistency. This information is not intended to replace advice given to you by your health care provider. Make sure you discuss any questions you have with your health care provider. Document Revised: 11/17/2018 Document Reviewed: 11/06/2016 Elsevier Patient Education  2020 [employer] Inc.  

## 2019-11-02 LAB — ALKALINE PHOSPHATASE: Alkaline Phosphatase: 141 IU/L — ABNORMAL HIGH (ref 39–117)

## 2019-11-02 LAB — GAMMA GT: GGT: 17 IU/L (ref 0–60)

## 2019-11-15 ENCOUNTER — Telehealth: Payer: Self-pay | Admitting: *Deleted

## 2019-11-15 NOTE — Telephone Encounter (Addendum)
PA modafinil submitted on [facility] 11/14/19. Key: [id number] Request Reference Number: [id number]. MODAFINIL TAB 200MG  is approved through 05/15/2020. Your patient may now fill this prescription and it will be covered.

## 2019-11-16 ENCOUNTER — Other Ambulatory Visit: Payer: Self-pay | Admitting: Neurology

## 2019-11-16 ENCOUNTER — Telehealth: Payer: Self-pay | Admitting: *Deleted

## 2019-11-16 ENCOUNTER — Encounter: Payer: Self-pay | Admitting: Neurology

## 2019-11-16 ENCOUNTER — Ambulatory Visit (INDEPENDENT_AMBULATORY_CARE_PROVIDER_SITE_OTHER): Payer: 59 | Admitting: Neurology

## 2019-11-16 ENCOUNTER — Other Ambulatory Visit: Payer: Self-pay

## 2019-11-16 VITALS — BP 152/94 | HR 65 | Temp 97.9°F | Ht 64.0 in | Wt 315.0 lb

## 2019-11-16 DIAGNOSIS — Z79899 Other long term (current) drug therapy: Secondary | ICD-10-CM | POA: Diagnosis not present

## 2019-11-16 DIAGNOSIS — R269 Unspecified abnormalities of gait and mobility: Secondary | ICD-10-CM | POA: Diagnosis not present

## 2019-11-16 DIAGNOSIS — G35 Multiple sclerosis: Secondary | ICD-10-CM

## 2019-11-16 DIAGNOSIS — B029 Zoster without complications: Secondary | ICD-10-CM

## 2019-11-16 DIAGNOSIS — G4719 Other hypersomnia: Secondary | ICD-10-CM

## 2019-11-16 DIAGNOSIS — E538 Deficiency of other specified B group vitamins: Secondary | ICD-10-CM

## 2019-11-16 DIAGNOSIS — Z9884 Bariatric surgery status: Secondary | ICD-10-CM | POA: Insufficient documentation

## 2019-11-16 DIAGNOSIS — Z6841 Body Mass Index (BMI) 40.0 and over, adult: Secondary | ICD-10-CM

## 2019-11-16 DIAGNOSIS — E559 Vitamin D deficiency, unspecified: Secondary | ICD-10-CM | POA: Insufficient documentation

## 2019-11-16 MED ORDER — PHENTERMINE HCL 37.5 MG PO CAPS: 37.5000 mg | ORAL_CAPSULE | ORAL | 5 refills | Status: DC

## 2019-11-16 NOTE — Telephone Encounter (Signed)
Request Reference Number: [id number]. PHENTERMINE CAP 37.5MG  is approved through 05/17/2020. Your patient may now fill this prescription and it will be covered. Faxed notice of approval to [facility] at [phone]. Received fax confirmation.

## 2019-11-16 NOTE — Telephone Encounter (Signed)
PA phentermine submitted on CMM. KeyTomasa Rand - PA Case ID: [id number] - Rx #: [id number]. Waiting on determination from [facility].

## 2019-11-16 NOTE — Progress Notes (Signed)
[facility]  PATIENT: Jody Lee DOB: 1971/07/10  REFERRING DOCTOR OR PCP:  Venora Maples SOURCE: patient and labs, notes in EMR and MRI on PACS ________________________________   HISTORICAL  CHIEF COMPLAINT:  Chief Complaint  Patient presents with  . Multiple Sclerosis    Room 12. Here for 6 month f/u. Last Ocrevus infusion: 09/18/2019. Next one: 04/01/2020. Received 1st Covid-19 vaccine 11/03/19; Will have endoscopy due to recent vomiting"    HISTORY OF PRESENT ILLNESS:  Jody Lee is a 49 y.o. woman with relapsing remittingMS diagnosed in 2005.     Update 11/16/2019: She is on Ocrevus and had her last infusion in February 2021.    IgG/IgM was normal 106/2020.    Her gait is stable with a mild right foot drop.  She has gained weight and feels that reduces her gait as well.    She started gained weight after her mom died (80 pounds in last 18 months).   She had bariatric surgery (Roex en Y) in the past and had stable weight for a few years.     She is noting more spasticity in her legs.   Movements in bed trigger them, especially if the day was more active.  If this happens she takes 1-2 baclofen's.    She sleeps well most nights though moans in her sleep.   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   She does nap on vacation and felt better.   Provigil helps sleepiness.  She had her first vaccination for Covid-19 in late March and is scheduled for her second dose.   EPWORTH SLEEPINESS SCALE  On a scale of 0 - 3 what is the chance of dozing:  Sitting and Reading:  1    Watching TV:   2 Sitting inactive in a public place: 0 Passenger in car for one hour: 0 Lying down to rest in the afternoon: 2 Sitting and talking to someone: 0 Sitting quietly after lunch:  0 In a car, stopped in traffic:  0  Total (out of 24):   5/24   Not sleepy.    Update 05/16/2019: She is on Ocrevus for RRMS.   Her last dose was  03/23/2019.   She tolerated it well with no scratchy dose (had that on 1st half dose and 1st full dose).    She feels tired x 1 day.     She is noting some cramps in her leg at night,left > right.   Baclofen, if taken before the cramp helps, but less so if spasms is there.   Muscles most involve dorsiflex the ankle.   Calf is not involved.     She notes some tingling at times.    Gait is good though she has occasional stumbles.   No weakness though grip seems reduced.   Vision is about the same.   She sees ophtho regularly.     Provigil has helped her feel more alert and awake.   We did a PSG but she had only negligible OSA with an AHI = 3.3.   She snores.   The EDS is better on modafinil.    She has gained 60 pounds in 2 years.      She had bariatric surgery in 2003 and lost 150 pounds.     EPWORTH SLEEPINESS SCALE  On a scale of 0 - 3 what is the chance of dozing:  Sitting and  Reading:  2    Watching TV:   2 Sitting inactive in a public place: 0 Passenger in car for one hour: 2 Lying down to rest in the afternoon: 1 Sitting and talking to someone: 0 Sitting quietly after lunch:  0 In a car, stopped in traffic:  0  Total (out of 24):   7/24   Not sleepy.     Update 11/10/2018 (video): She was switched to Ocrevus over a year ago.   She had her last infusion in February, 2020.  On Ocrevus she has not had any exacerbations and feels mostly stable.  Physically, gait is slightly worse with a mild right foot drop if more tired.  She has 2 falls in the living room.  One fall had bruises but no LOC.  She gets cramping in her legs and feet that seems worse.   Cramps are worse during the day.   Baclofen helps some and she tolerates it well.   She notes more hand weakness and some cramping.   Both hands seem mildly weak and sometimes she gets wrist   She has more fatigue and notes decreased focus/attention.   Provigil has helped these symptoms.   Mood is doing well on current medications.  She has some  insomnia, getting 4-5 hours sleep many nights.   She snores a little but sometimes has some moaning with struggled breathing and some gasping/snorting.  It sometimes wakes her up.  EPWORTH SLEEPINESS SCALE  On a scale of 0 - 3 what is the chance of dozing:  Sitting and Reading:  2 Watching TV:   2 Sitting inactive in a public place: 0 Passenger in car for one hour: 3 Lying down to rest in the afternoon: 2 Sitting and talking to someone: 0 Sitting quietly after lunch:  1 In a car, stopped in traffic:  0  Total (10 out of 24):   Mild excesively sleepy    She had several rounds of antibiotics for probable bronchitis in December and January. Steroids were added and she improved.      She works from home but her husband has to work outside and her three kids and one grandchild are in the house      Update 03/24/2018: She had Ocrevus 1st course in February 2019 and just had her second dose 03/18/2018.   She slept through the infusion.      She tolerated it well and she has not had any exacerbation since starting.      Physically she feels baseline.     She is walking well.   If very tired the right foot drops some.    Strength is mildly reduced right worse than left.   Her  grips are mildly weaker.   She denies numbness or dysesthesia.    Vision is ok.     She has left astigmatism.   Colors are mildly desaturated on left.     Bladder is fine.   She is depressed and stressed with the recent death oif her mother (had heart valve surgery).   She would like to be on an antidepressant.   She has never been on one on the past.     She is tearful, even at work.     While her mom ws in the hospital she was physically and mentally very fatigued (she was hospitalized x 8 weeks).   Before her mom's long hospital stay, fatigue was mostly mild.    Cognition is ok  with mikd atentional issues.      Update 09/24/2017: She completed the first 2 halves of the first ocrelizumab infusion last week. She did have  some nausea and heartburn the day of the infusion an next day but otherwise felt fine.    Heartburn improved with Zantac.  She denies any new symptoms.   The hand tingling is resolved.      No new weakness.   Her gait is better but will occ stumble.     She can walk a mile.   Vision seems to be stable.   She did need a new prescription.  She has diarrhea since gastric bypass surgery.  She also has anemia and had B12 deficiency.  She has urinary frequency, unchanged in severity.   Modafinil has helped her fatigue and attentional issues.   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   Mood is doing well.    She sleeps well most nights.    She feels cognition is back to baseline.    Update 06/24/2017:   I personally reviewed the results and images of her 06/15/2017 MRI.  She has one enhancing lesion on the MRI of the brain and also has a new lesion that is nonenhancing in the cervical spine.  She notes more muscle stiffness and more right hand tingling the past 2 months.    In detail, we discussed several options for treatment due to her breakthrough while on Gilenya. Specifically we went over the risks and benefits of Tysabri, Lemtrada and ocrelizumab. We will check blood work to determine if any of these are contraindicated. She is most interested in either Tysabri or ocrelizumab   Update 05/07/2017: She is on Gilenya for her relapsing multiple sclerosis. She tolerates it well. She has not had any definite exacerbation recently, though one week ago she had one day where she was dizzy the symptoms have since resolved.   When she had the vertigo, she felt her gait was tilted to the one side.     For the most part, gait does well but the right foot drags sometimes.     She denies significant numbness or weakness,    She needs stronger glasses and one day she felt her vision was cloudy for several hours.   She has some urinary frequency and notes bladder pain at times.      In  general, fatigue is ok most days but she has occasional times it is worse.   She walks one mile daily as exercise.    She also had one week in August with severe fatigue.  She felt back to baseline the next week.  Mood is doing well.     Provigil has helped her.     After the last visit, she developed shingles. She feels she has completely recovered from the shingles of the right thorax/axilla.    ______________________________ From 10/21/2016  MS:   She is on Gilenya and tolerates it well.     She has not had any definite exacerbation.   She has noted more rightleg spasms.   Spine MRI's showed several chronic plaques in her cervical spine and one in the thoracic spine.,    Gait/strength/sensation:  She sometimes stumbles due to her right foot being slightly weak.    Sometimes she veers while walking.  No falls.      She denies any major problem with weakness or numbness. She has more trouble using a  can opener and occasionally is dropping items.     She gets right foot cramps, helped by baclofen..     Bladder: She notes worsening urinary frequency and urgency. She does not have incontinence.   She has fewer spasms.    Vision: She denies any MS related visual problems.   She has new glasses  Fatigue/sleep: She reports physical and cognitive fatigue. She has noted a benefit with Provigil. Adderall was not well tolerated.     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   She also takes a lot of caffeine on days that she needs to stay up longer or if she is out of her Provigil. She has some difficulty falling asleep but once asleep often stays asleep.    Often she has difficulty turning her mind off.   Her fianc has noted that she moans that her sleep sometimes. She has not had any screaming, kicking or punching.  Mood/cognition: She denies any major difficulty with depression or anxiety at this time. She notes some  cognitive difficulty. This is mostly problems with focusing and distraction.  She feels cognition does better with Provigil as well .Marland Kitchen She strays mentally active.  Other: Her anemia is doing better and she gets intermittent iron infusions and B12 monthly.   MS History:   She was diagnosed with MS in 2005 after presenting with a spinal cord syndrome. She started with numbness and pain in the feet that worked its way up to her abdomen over a couple days.   Initially, she had an MRI of the spine that showed a plaque consistent with her symptoms. She was referred to Dr. Maple Hudson. He did a lumbar puncture and ordered an MRI of the brain. Both were consistent with multiple sclerosis and I started to see her. Initially, she was placed on Betaseron and did very well for the first few years. Then around 2011 or 2012, she had an exacerbation and testing showed that she had developed antibodies against Betaseron. Therefore, she was switched to Gilenya at that time. She has been on Gilenya for the last 4+ years and has done well. Specifically, she has not reported any difficulties with exacerbations and she tolerates the medication well.   Her last MRI was performed at [facility] November 2015.  MRI of the brain from 03/02/2013 showed  typical periventricular T2/FLAIR hyperintense foci, predominantly in the periventricular white matter.   There were no acute foci.   REVIEW OF SYSTEMS: Constitutional: No fevers, chills, sweats, or change in appetite.   She has fatigue. Eyes: No visual changes, double vision, eye pain Ear, nose and throat: No hearing loss, ear pain, nasal congestion, sore throat Cardiovascular: No chest pain, palpitations Respiratory: No shortness of breath at rest or with exertion.   No wheezes GastrointestinaI: No nausea, vomiting, diarrhea, abdominal pain, fecal incontinence Genitourinary: No dysuria, urinary retention or frequency.  No nocturia. Musculoskeletal: No neck pain,  back pain Integumentary: No rash, pruritus, skin lesions Neurological: as above Psychiatric: There is depression and anxiety.  She is tearful at times. Endocrine: No palpitations, diaphoresis, change in appetite, change in weigh or increased thirst Hematologic/Lymphatic: No anemia, purpura, petechiae. Allergic/Immunologic: No itchy/runny eyes, nasal congestion, recent allergic reactions, rashes  ALLERGIES: Allergies  Allergen Reactions  . Nsaids Other (See Comments)    Not supposed to take d/t bariatric surgery   . Ciprofloxacin Rash  Felt like heat/burning rash    HOME MEDICATIONS:  Current Outpatient Medications:  .  acetaminophen (TYLENOL) 325 MG tablet, Take 650 mg by mouth every 4 (four) hours as needed., Disp: , Rfl:  .  baclofen (LIORESAL) 10 MG tablet, TAKE 1 TABLET(10 MG) BY MOUTH THREE TIMES DAILY, Disp: 270 tablet, Rfl: 3 .  escitalopram (LEXAPRO) 10 MG tablet, TAKE 1 TABLET(10 MG) BY MOUTH DAILY (Patient taking differently: Take 10 mg by mouth daily as needed. ), Disp: 30 tablet, Rfl: 11 .  gabapentin (NEURONTIN) 100 MG capsule, TAKE 1 CAPSULE(100 MG) BY MOUTH THREE TIMES DAILY, Disp: 90 capsule, Rfl: 2 .  hydrochlorothiazide (MICROZIDE) 12.5 MG capsule, TAKE 1 CAPSULE BY MOUTH  DAILY, Disp: 30 capsule, Rfl: 0 .  lisinopril (ZESTRIL) 40 MG tablet, TAKE 1 TABLET BY MOUTH  DAILY, Disp: 30 tablet, Rfl: 0 .  loratadine (CLARITIN) 10 MG tablet, Take 10 mg by mouth daily., Disp: , Rfl:  .  modafinil (PROVIGIL) 200 MG tablet, TAKE 1 TABLET BY MOUTH DAILY, Disp: 30 tablet, Rfl: 5 .  Multiple Vitamin (MULTI VITAMIN DAILY PO), Take by mouth., Disp: , Rfl:  .  ocrelizumab 600 mg in sodium chloride 0.9 % 500 mL, Inject 600 mg into the vein once., Disp: , Rfl:  .  omeprazole (PRILOSEC) 20 MG capsule, Take 1 capsule (20 mg total) by mouth daily., Disp: 30 capsule, Rfl: 0 .  traMADol (ULTRAM) 50 MG tablet, 1 pill po bid prn, Disp: 60 tablet, Rfl: 0 .  phentermine 37.5 MG capsule, Take  1 capsule (37.5 mg total) by mouth every morning., Disp: 30 capsule, Rfl: 5 No current facility-administered medications for this visit.  Facility-Administered Medications Ordered in Other Visits:  .  gadopentetate dimeglumine (MAGNEVIST) injection 20 mL, 20 mL, Intravenous, Once PRN, Haili Donofrio, Pearletha Furl, MD  PAST MEDICAL HISTORY: Past Medical History:  Diagnosis Date  . Anemia   . Hypertension   . Menorrhagia   . MS (multiple sclerosis) (HCC)   . Shingles    right side of chest and back  . Vision abnormalities   . Vitamin B 12 deficiency   . Vitamin D deficiency     PAST SURGICAL HISTORY: Past Surgical History:  Procedure Laterality Date  . CHOLECYSTECTOMY    . GASTRIC BYPASS      FAMILY HISTORY: Family History  Problem Relation Age of Onset  . Stroke Mother   . Heart disease Mother   . Asthma Mother   . Diabetes Mother   . Hypertension Father   . Heart disease Father   . Diabetes Father   . Asthma Brother   . Alcoholism Brother   . Obesity Brother   . Heart disease Brother   . Diabetes Maternal Grandmother   . Lung cancer Maternal Grandfather   . Breast cancer Neg Hx     SOCIAL HISTORY:  Social History   Socioeconomic History  . Marital status: Single    Spouse name: Not on file  . Number of children: 1  . Years of education: Not on file  . Highest education level: Not on file  Occupational History  . Occupation: Magazine features editor  Tobacco Use  . Smoking status: Never Smoker  . Smokeless tobacco: Never Used  Substance and Sexual Activity  . Alcohol use: No  . Drug use: No  . Sexual activity: Yes    Birth control/protection: Injection  Other Topics Concern  . Not on file  Social History Narrative   Remarried in 2018. Has  one daughter, one grandchild and 2 step children. Works from home   Social Determinants of Corporate investment banker Strain:   . Difficulty of Paying Living Expenses:   Food Insecurity:   . Worried About Programme researcher, broadcasting/film/video in the  Last Year:   . Barista in the Last Year:   Transportation Needs:   . Freight forwarder (Medical):   Marland Kitchen Lack of Transportation (Non-Medical):   Physical Activity:   . Days of Exercise per Week:   . Minutes of Exercise per Session:   Stress:   . Feeling of Stress :   Social Connections:   . Frequency of Communication with Friends and Family:   . Frequency of Social Gatherings with Friends and Family:   . Attends Religious Services:   . Active Member of Clubs or Organizations:   . Attends Banker Meetings:   Marland Kitchen Marital Status:   Intimate Partner Violence:   . Fear of Current or Ex-Partner:   . Emotionally Abused:   Marland Kitchen Physically Abused:   . Sexually Abused:      PHYSICAL EXAM  Vitals:   11/16/19 0954  BP: (!) 152/94  Pulse: 65  Temp: 97.9 F (36.6 C)  Weight: (!) 315 lb (142.9 kg)  Height:  (1.626 m)    Body mass index is 54.07 kg/m.   General: The patient is well-developed and well-nourished and in no acute distress    Neurologic Exam  Mental status: The patient is alert and oriented x 3 at the time of the examination. The patient has apparent normal recent and remote memory, with an apparently normal attention span and concentration ability.   Speech is normal.  Cranial nerves: Extraocular muscles are normal.  Facial strength and sensation is normal.. Hearing is normal and symmetric. Trapezius strength is normal.   Motor:  Muscle bulk is normal.   Muscle tone is normal.  Strength is 5/5 in the arms and legs.  Sensory: Intact sensation to touch and vibration in the arms and legs.  Coordination: Finger-nose-finger and heel-to-shin is performed well.    Gait and station: Station is normal.  Her gait is normal.  Tandem gait is wide.. Romberg is negative.   Reflexes: Deep tendon reflexes are symmetric and normal bilaterally.       Multiple sclerosis (HCC)  Gait disturbance  High risk medication use  Excessive daytime  sleepiness  BMI 50.0-59.9, adult (HCC)  Vitamin D deficiency - Plan: VITAMIN D 25 Hydroxy (Vit-D Deficiency, Fractures)  History of Roux-en-Y gastric bypass - Plan: Vitamin B12  B12 deficiency - Plan: Vitamin B12   1.   Continue Ocrevus every 6 months. 2.   Trial of phentermine to help weight loss and also help sleepiness.   Can hold Provigil but if combination works better, she can continue. 3.   Continue other medications.   Tale baclofe 4.   Stay active and exercise as tolerated.  We discussed weight loss. 5.   Has history of vitamin B12 and D deficiencies but no recent supplements.   She is s/p gasttric bypass 6.   Return in 6 months or sooner if there are new or worsening neurologic symptoms.  45-minute office visit with the majority of the time spent face-to-face for history and physical, discussion/counseling and decision-making.  Additional time with record review and documentation.   Matthias Bogus A. Epimenio Foot, MD, PhD 11/16/2019, 10:31 AM Certified in Neurology, Clinical Neurophysiology, Sleep Medicine, Pain Medicine and Neuroimaging  [facility] [address] [place], [place] [address] [phone]

## 2019-11-17 ENCOUNTER — Other Ambulatory Visit: Payer: Self-pay | Admitting: Neurology

## 2019-11-17 LAB — VITAMIN B12: Vitamin B-12: 252 pg/mL (ref 232–1245)

## 2019-11-17 LAB — VITAMIN D 25 HYDROXY (VIT D DEFICIENCY, FRACTURES): Vit D, 25-Hydroxy: 12.9 ng/mL — ABNORMAL LOW (ref 30.0–100.0)

## 2019-11-17 MED ORDER — VITAMIN D (ERGOCALCIFEROL) 1.25 MG (50000 UNIT) PO CAPS: 50000.0000 [IU] | ORAL_CAPSULE | ORAL | 3 refills | Status: DC

## 2019-12-14 ENCOUNTER — Other Ambulatory Visit
Admission: RE | Admit: 2019-12-14 | Discharge: 2019-12-14 | Disposition: A | Discharge status: Home / Self Care | Payer: 59 | Source: Ambulatory Visit | Attending: Gastroenterology | Admitting: Gastroenterology

## 2019-12-14 DIAGNOSIS — Z01812 Encounter for preprocedural laboratory examination: Secondary | ICD-10-CM | POA: Insufficient documentation

## 2019-12-14 DIAGNOSIS — Z20822 Contact with and (suspected) exposure to covid-19: Secondary | ICD-10-CM | POA: Insufficient documentation

## 2019-12-14 LAB — SARS CORONAVIRUS 2 (TAT 6-24 HRS): SARS Coronavirus 2: NEGATIVE

## 2019-12-18 ENCOUNTER — Ambulatory Visit: Payer: 59 | Admitting: Anesthesiology

## 2019-12-18 ENCOUNTER — Encounter
Admission: RE | Disposition: A | Discharge status: Home / Self Care | Payer: Self-pay | Source: Home / Self Care | Attending: Gastroenterology

## 2019-12-18 ENCOUNTER — Ambulatory Visit
Admission: RE | Admit: 2019-12-18 | Discharge: 2019-12-18 | Disposition: A | Discharge status: Home / Self Care | Payer: 59 | Attending: Gastroenterology | Admitting: Gastroenterology

## 2019-12-18 ENCOUNTER — Other Ambulatory Visit: Payer: Self-pay

## 2019-12-18 DIAGNOSIS — K2100 Gastro-esophageal reflux disease with esophagitis, without bleeding: Secondary | ICD-10-CM | POA: Insufficient documentation

## 2019-12-18 DIAGNOSIS — F419 Anxiety disorder, unspecified: Secondary | ICD-10-CM | POA: Diagnosis not present

## 2019-12-18 DIAGNOSIS — Z980 Intestinal bypass and anastomosis status: Secondary | ICD-10-CM | POA: Diagnosis not present

## 2019-12-18 DIAGNOSIS — R112 Nausea with vomiting, unspecified: Secondary | ICD-10-CM | POA: Diagnosis not present

## 2019-12-18 DIAGNOSIS — Z79899 Other long term (current) drug therapy: Secondary | ICD-10-CM | POA: Insufficient documentation

## 2019-12-18 DIAGNOSIS — F329 Major depressive disorder, single episode, unspecified: Secondary | ICD-10-CM | POA: Insufficient documentation

## 2019-12-18 DIAGNOSIS — Z881 Allergy status to other antibiotic agents status: Secondary | ICD-10-CM | POA: Diagnosis not present

## 2019-12-18 DIAGNOSIS — R1310 Dysphagia, unspecified: Secondary | ICD-10-CM | POA: Diagnosis not present

## 2019-12-18 DIAGNOSIS — I10 Essential (primary) hypertension: Secondary | ICD-10-CM | POA: Insufficient documentation

## 2019-12-18 DIAGNOSIS — Z79891 Long term (current) use of opiate analgesic: Secondary | ICD-10-CM | POA: Diagnosis not present

## 2019-12-18 DIAGNOSIS — Z886 Allergy status to analgesic agent status: Secondary | ICD-10-CM | POA: Diagnosis not present

## 2019-12-18 DIAGNOSIS — G35 Multiple sclerosis: Secondary | ICD-10-CM | POA: Insufficient documentation

## 2019-12-18 HISTORY — PX: ESOPHAGOGASTRODUODENOSCOPY (EGD) WITH PROPOFOL: SHX5813

## 2019-12-18 LAB — POCT PREGNANCY, URINE: Preg Test, Ur: NEGATIVE

## 2019-12-18 SURGERY — ESOPHAGOGASTRODUODENOSCOPY (EGD) WITH PROPOFOL
Anesthesia: General

## 2019-12-18 MED ORDER — LIDOCAINE HCL (CARDIAC) PF 100 MG/5ML IV SOSY
PREFILLED_SYRINGE | INTRAVENOUS | Status: DC | PRN
  Administered 2019-12-18: 50 mg via INTRAVENOUS

## 2019-12-18 MED ORDER — LIDOCAINE HCL (PF) 2 % IJ SOLN
INTRAMUSCULAR | Status: AC
  Filled 2019-12-18: qty 10

## 2019-12-18 MED ORDER — PROPOFOL 500 MG/50ML IV EMUL
INTRAVENOUS | Status: AC
  Filled 2019-12-18: qty 50

## 2019-12-18 MED ORDER — PROPOFOL 500 MG/50ML IV EMUL
INTRAVENOUS | Status: DC | PRN
  Administered 2019-12-18: 125 ug/kg/min via INTRAVENOUS

## 2019-12-18 MED ORDER — PROPOFOL 10 MG/ML IV BOLUS
INTRAVENOUS | Status: DC | PRN
  Administered 2019-12-18: 100 mg via INTRAVENOUS

## 2019-12-18 MED ORDER — SODIUM CHLORIDE 0.9 % IV SOLN
INTRAVENOUS | Status: DC
  Administered 2019-12-18: 1000 mL via INTRAVENOUS

## 2019-12-18 NOTE — H&P (Signed)
Jody Bouillon, MD [address], [place], [place], [address] [address], [address], [place], [place], [address] Phone: [phone]  Fax: [phone]  Primary Care Physician:  Patient, No Pcp Per   Pre-Procedure History & Physical: HPI:  Jody Lee is a 49 y.o. female is here for an EGD.   Past Medical History:  Diagnosis Date  . Anemia   . Hypertension   . Menorrhagia   . MS (multiple sclerosis) (HCC)   . Shingles    right side of chest and back  . Vision abnormalities   . Vitamin B 12 deficiency   . Vitamin D deficiency     Past Surgical History:  Procedure Laterality Date  . CHOLECYSTECTOMY    . GASTRIC BYPASS      Prior to Admission medications   Medication Sig Start Date End Date Taking? Authorizing Provider  acetaminophen (TYLENOL) 325 MG tablet Take 650 mg by mouth every 4 (four) hours as needed.   Yes [provider]  baclofen (LIORESAL) 10 MG tablet TAKE 1 TABLET(10 MG) BY MOUTH THREE TIMES DAILY 04/04/18  Yes Sater, Pearletha Furl, MD  escitalopram (LEXAPRO) 10 MG tablet TAKE 1 TABLET(10 MG) BY MOUTH DAILY Patient taking differently: Take 10 mg by mouth daily as needed.  03/07/19  Yes Sater, Pearletha Furl, MD  gabapentin (NEURONTIN) 100 MG capsule TAKE 1 CAPSULE(100 MG) BY MOUTH THREE TIMES DAILY 11/16/19  Yes Sater, Pearletha Furl, MD  hydrochlorothiazide (MICROZIDE) 12.5 MG capsule TAKE 1 CAPSULE BY MOUTH  DAILY 03/13/19  Yes Galen Manila, NP  lisinopril (ZESTRIL) 40 MG tablet TAKE 1 TABLET BY MOUTH  DAILY 03/13/19  Yes Galen Manila, NP  loratadine (CLARITIN) 10 MG tablet Take 10 mg by mouth daily.   Yes [provider]  modafinil (PROVIGIL) 200 MG tablet TAKE 1 TABLET BY MOUTH DAILY 09/26/19  Yes Sater, Pearletha Furl, MD  Multiple Vitamin (MULTI VITAMIN DAILY PO) Take by mouth.   Yes [provider]  ocrelizumab 600 mg in sodium chloride 0.9 % 500 mL Inject 600 mg into the vein once.   Yes [provider]    omeprazole (PRILOSEC) 20 MG capsule Take 1 capsule (20 mg total) by mouth daily. 10/31/19  Yes Jody Lee B, MD  phentermine 37.5 MG capsule Take 1 capsule (37.5 mg total) by mouth every morning. 11/16/19  Yes Sater, Pearletha Furl, MD  traMADol Janean Sark) 50 MG tablet 1 pill po bid prn 08/15/19  Yes Sater, Pearletha Furl, MD  Vitamin D, Ergocalciferol, (DRISDOL) 1.25 MG (50000 UNIT) CAPS capsule Take 1 capsule (50,000 Units total) by mouth every 7 (seven) days. 11/17/19  Yes Sater, Pearletha Furl, MD    Allergies as of 10/31/2019 - Review Complete 10/31/2019  Allergen Reaction Noted  . Nsaids Other (See Comments) 12/25/2014  . Ciprofloxacin Rash 12/25/2014    Family History  Problem Relation Age of Onset  . Stroke Mother   . Heart disease Mother   . Asthma Mother   . Diabetes Mother   . Hypertension Father   . Heart disease Father   . Diabetes Father   . Asthma Brother   . Alcoholism Brother   . Obesity Brother   . Heart disease Brother   . Diabetes Maternal Grandmother   . Lung cancer Maternal Grandfather   . Breast cancer Neg Hx     Social History   Socioeconomic History  . Marital status: Single    Spouse name: Not on file  .  Number of children: 1  . Years of education: Not on file  . Highest education level: Not on file  Occupational History  . Occupation: Scientist, research (physical sciences)  Tobacco Use  . Smoking status: Never Smoker  . Smokeless tobacco: Never Used  Substance and Sexual Activity  . Alcohol use: No  . Drug use: No  . Sexual activity: Yes    Birth control/protection: Injection  Other Topics Concern  . Not on file  Social History Narrative   Remarried in 2018. Has one daughter, one grandchild and 2 step children. Works from home   Social Determinants of Radio broadcast assistant Strain:   . Difficulty of Paying Living Expenses:   Food Insecurity:   . Worried About Charity fundraiser in the Last Year:   . Arboriculturist in the Last Year:   Transportation Needs:   . Consulting civil engineer (Medical):   Marland Kitchen Lack of Transportation (Non-Medical):   Physical Activity:   . Days of Exercise per Week:   . Minutes of Exercise per Session:   Stress:   . Feeling of Stress :   Social Connections:   . Frequency of Communication with Friends and Family:   . Frequency of Social Gatherings with Friends and Family:   . Attends Religious Services:   . Active Member of Clubs or Organizations:   . Attends Archivist Meetings:   Marland Kitchen Marital Status:   Intimate Partner Violence:   . Fear of Current or Ex-Partner:   . Emotionally Abused:   Marland Kitchen Physically Abused:   . Sexually Abused:     Review of Systems: See HPI, otherwise negative ROS  Physical Exam: BP 132/87   Pulse 75   Temp (!) 96.8 F (36 C) (Temporal)   Resp 20   Ht 5\' 4"  (1.626 m)   Wt (!) 143.3 kg   SpO2 99%   BMI 54.24 kg/m  General:   Alert,  pleasant and cooperative in NAD Head:  Normocephalic and atraumatic. Neck:  Supple; no masses or thyromegaly. Lungs:  Clear throughout to auscultation, normal respiratory effort.    Heart:  +S1, +S2, Regular rate and rhythm, No edema. Abdomen:  Soft, nontender and nondistended. Normal bowel sounds, without guarding, and without rebound.   Neurologic:  Alert and  oriented x4;  grossly normal neurologically.  Impression/Plan: Jody Lee is here for an EGD for dysphagia, N/V.  Risks, benefits, limitations, and alternatives regarding the procedure have been reviewed with the patient.  Questions have been answered.  All parties agreeable.   Jody Manifold, MD  12/18/2019, 8:23 AM

## 2019-12-18 NOTE — Transfer of Care (Signed)
Immediate Anesthesia Transfer of Care Note  Patient: Jody Lee  Procedure(s) Performed: ESOPHAGOGASTRODUODENOSCOPY (EGD) WITH PROPOFOL (N/A )  Patient Location: Endoscopy Unit  Anesthesia Type:General  Level of Consciousness: awake and alert   Airway & Oxygen Therapy: Patient Spontanous Breathing  Post-op Assessment: Report given to RN and Post -op Vital signs reviewed and stable  Post vital signs: Reviewed and stable  Last Vitals:  Vitals Value Taken Time  BP 95/58 12/18/19 0847  Temp 36.2 C 12/18/19 0845  Pulse 81 12/18/19 0847  Resp 21 12/18/19 0847  SpO2 97 % 12/18/19 0847    Last Pain:  Vitals:   12/18/19 0845  TempSrc: Temporal  PainSc: 0-No pain         Complications: No apparent anesthesia complications

## 2019-12-18 NOTE — Anesthesia Preprocedure Evaluation (Signed)
Anesthesia Evaluation  Patient identified by MRN, date of birth, ID band Patient awake    Reviewed: Allergy & Precautions, NPO status , Patient's Chart, lab work & pertinent test results  History of Anesthesia Complications Negative for: history of anesthetic complications  Airway Mallampati: II  TM Distance: >3 FB Neck ROM: Full    Dental  (+) Edentulous Upper, Edentulous Lower   Pulmonary neg pulmonary ROS, neg sleep apnea, neg COPD,    breath sounds clear to auscultation- rhonchi (-) wheezing      Cardiovascular hypertension, Pt. on medications (-) CAD, (-) Past MI, (-) Cardiac Stents and (-) CABG  Rhythm:Regular Rate:Normal - Systolic murmurs and - Diastolic murmurs    Neuro/Psych neg Seizures PSYCHIATRIC DISORDERS Anxiety Depression MS     GI/Hepatic negative GI ROS, Neg liver ROS,   Endo/Other  negative endocrine ROSneg diabetes  Renal/GU negative Renal ROS     Musculoskeletal negative musculoskeletal ROS (+)   Abdominal (+) + obese,   Peds  Hematology  (+) anemia ,   Anesthesia Other Findings Past Medical History: No date: Anemia No date: Hypertension No date: Menorrhagia No date: MS (multiple sclerosis) (HCC) No date: Shingles     Comment:  right side of chest and back No date: Vision abnormalities No date: Vitamin B 12 deficiency No date: Vitamin D deficiency   Reproductive/Obstetrics                             Anesthesia Physical Anesthesia Plan  ASA: II  Anesthesia Plan: General   Post-op Pain Management:    Induction: Intravenous  PONV Risk Score and Plan: 2 and Propofol infusion  Airway Management Planned: Natural Airway  Additional Equipment:   Intra-op Plan:   Post-operative Plan:   Informed Consent: I have reviewed the patients History and Physical, chart, labs and discussed the procedure including the risks, benefits and alternatives for the  proposed anesthesia with the patient or authorized representative who has indicated his/her understanding and acceptance.     Dental advisory given  Plan Discussed with: CRNA and Anesthesiologist  Anesthesia Plan Comments:         Anesthesia Quick Evaluation

## 2019-12-18 NOTE — Anesthesia Postprocedure Evaluation (Signed)
Anesthesia Post Note  Patient: Jody Lee  Procedure(s) Performed: ESOPHAGOGASTRODUODENOSCOPY (EGD) WITH PROPOFOL (N/A )  Patient location during evaluation: Endoscopy Anesthesia Type: General Level of consciousness: awake and alert and oriented Pain management: pain level controlled Vital Signs Assessment: post-procedure vital signs reviewed and stable Respiratory status: spontaneous breathing, nonlabored ventilation and respiratory function stable Cardiovascular status: blood pressure returned to baseline and stable Postop Assessment: no signs of nausea or vomiting Anesthetic complications: no     Last Vitals:  Vitals:   12/18/19 0855 12/18/19 0905  BP: 111/84 126/86  Pulse: 73 75  Resp: 18 18  Temp:    SpO2: 99% 100%    Last Pain:  Vitals:   12/18/19 0905  TempSrc:   PainSc: 0-No pain                 Karyn Brull

## 2019-12-18 NOTE — Op Note (Signed)
[facility] Gastroenterology Patient Name: Jody Lee Procedure Date: 12/18/2019 7:45 AM MRN: [id number] Account #: [id number] Date of Birth: [birth year] Admit Type: Outpatient Age: 49 Room: [facility] ENDO ROOM 2 Gender: Female Note Status: Finalized Procedure:             Upper GI endoscopy Indications:           Dysphagia, Nausea with vomiting Providers:             Jammie Clink B. Bonna Gains MD, MD Referring MD:          Olin Hauser (Referring MD) Medicines:             Monitored Anesthesia Care Complications:         No immediate complications. Procedure:             Pre-Anesthesia Assessment:                        - The risks and benefits of the procedure and the                         sedation options and risks were discussed with the                         patient. All questions were answered and informed                         consent was obtained.                        - Patient identification and proposed procedure were                         verified prior to the procedure.                        - ASA Grade Assessment: II - A patient with mild                         systemic disease.                        After obtaining informed consent, the endoscope was                         passed under direct vision. Throughout the procedure,                         the patient's blood pressure, pulse, and oxygen                         saturations were monitored continuously. The Endoscope                         was introduced through the mouth, and advanced to the                         jejunum. The upper GI endoscopy was accomplished with                         ease. The  patient tolerated the procedure well. Findings:      The examined esophagus was normal. Biopsies were obtained from the       proximal and distal esophagus with cold forceps for histology of       suspected eosinophilic esophagitis.      Evidence of a Roux-en-Y gastrojejunostomy  was found. The gastrojejunal       anastomosis was characterized by healthy appearing mucosa. This was       traversed. The pouch-to-jejunum limb was characterized by healthy       appearing mucosa.      There is no endoscopic evidence of stricture, ulceration or deformity in       the jejunum. Impression:            - Normal esophagus. Biopsied.                        - Roux-en-Y gastrojejunostomy with gastrojejunal                         anastomosis characterized by healthy appearing mucosa. Recommendation:        - Await pathology results.                        - Return to my office in 4 weeks.                        - Continue present medications.                        - Return to primary care physician in 4 weeks.                        - The findings and recommendations were discussed with                         the patient. Procedure Code(s):     --- Professional ---                        [phone], Esophagogastroduodenoscopy, flexible,                         transoral; with biopsy, single or multiple Diagnosis Code(s):     --- Professional ---                        Z98.0, Intestinal bypass and anastomosis status                        R13.10, Dysphagia, unspecified                        R11.2, Nausea with vomiting, unspecified CPT copyright 2019 American Medical Association. All rights reserved. The codes documented in this report are preliminary and upon coder review may  be revised to meet current compliance requirements.  Melodie Bouillon, MD Michel Bickers B. Maximino Greenland MD, MD 12/18/2019 8:50:50 AM This report has been signed electronically. Number of Addenda: 0 Note Initiated On: 12/18/2019 7:45 AM Estimated Blood Loss:  Estimated blood loss: none.      [facility]

## 2019-12-19 ENCOUNTER — Encounter: Payer: Self-pay | Admitting: *Deleted

## 2019-12-19 LAB — SURGICAL PATHOLOGY

## 2020-01-01 ENCOUNTER — Ambulatory Visit (INDEPENDENT_AMBULATORY_CARE_PROVIDER_SITE_OTHER): Payer: 59 | Admitting: Gastroenterology

## 2020-01-01 ENCOUNTER — Encounter: Payer: Self-pay | Admitting: Gastroenterology

## 2020-01-01 ENCOUNTER — Other Ambulatory Visit: Payer: Self-pay

## 2020-01-01 VITALS — BP 163/91 | HR 76 | Temp 98.7°F | Ht 64.0 in | Wt 319.8 lb

## 2020-01-01 DIAGNOSIS — R1310 Dysphagia, unspecified: Secondary | ICD-10-CM

## 2020-01-01 NOTE — Patient Instructions (Signed)
Please stop taking your Omeprazole in 30 days.

## 2020-01-02 NOTE — Progress Notes (Signed)
Jody Antigua, MD [address]  [place]  [place], [place] [address]  Main: [phone]  Fax: [phone]   Primary Care Physician: Patient, No Pcp Per   Chief Complaint  Patient presents with  . Dysphagia    Patient stated that she was doing well until 2 weeks ago she felt her food get lodged on her chest and eventually it got better.    HPI: Jody Lee is a 49 y.o. female with history of Roux-en-Y gastric bypass here for follow-up.  Patient previously reported dysphagia and underwent EGD which was normal and reassuring with healthy Roux-en-Y gastrojejunostomy seen, and no obstruction in the esophagus to attribute to patient's symptoms.  Pathology negative for EOE.  Patient reports occasional heartburn.  No nausea or vomiting.  She states her symptoms have much improved since knowing that her endoscopic findings were reassuring.  Current Outpatient Medications  Medication Sig Dispense Refill  . acetaminophen (TYLENOL) 325 MG tablet Take 650 mg by mouth every 4 (four) hours as needed.    . baclofen (LIORESAL) 10 MG tablet TAKE 1 TABLET(10 MG) BY MOUTH THREE TIMES DAILY 270 tablet 3  . gabapentin (NEURONTIN) 100 MG capsule TAKE 1 CAPSULE(100 MG) BY MOUTH THREE TIMES DAILY 90 capsule 2  . loratadine (CLARITIN) 10 MG tablet Take 10 mg by mouth daily.    . modafinil (PROVIGIL) 200 MG tablet TAKE 1 TABLET BY MOUTH DAILY 30 tablet 5  . Multiple Vitamin (MULTI VITAMIN DAILY PO) Take by mouth.    Jody Lee ocrelizumab 600 mg in sodium chloride 0.9 % 500 mL Inject 600 mg into the vein once.    . phentermine 37.5 MG capsule Take 1 capsule (37.5 mg total) by mouth every morning. 30 capsule 5  . traMADol (ULTRAM) 50 MG tablet 1 pill po bid prn 60 tablet 0  . Vitamin D, Ergocalciferol, (DRISDOL) 1.25 MG (50000 UNIT) CAPS capsule Take 1 capsule (50,000 Units total) by mouth every 7 (seven) days. 13 capsule 3   No current facility-administered medications for this visit.    Facility-Administered Medications Ordered in Other Visits  Medication Dose Route Frequency Provider Last Rate Last Admin  . gadopentetate dimeglumine (MAGNEVIST) injection 20 mL  20 mL Intravenous Once PRN Britt Bottom, MD        Allergies as of 01/01/2020 - Review Complete 01/01/2020  Allergen Reaction Noted  . Nsaids Other (See Comments) 12/25/2014  . Ciprofloxacin Rash 12/25/2014    ROS:  General: Negative for anorexia, weight loss, fever, chills, fatigue, weakness. ENT: Negative for hoarseness, difficulty swallowing , nasal congestion. CV: Negative for chest pain, angina, palpitations, dyspnea on exertion, peripheral edema.  Respiratory: Negative for dyspnea at rest, dyspnea on exertion, cough, sputum, wheezing.  GI: See history of present illness. GU:  Negative for dysuria, hematuria, urinary incontinence, urinary frequency, nocturnal urination.  Endo: Negative for unusual weight change.    Physical Examination:   BP (!) 163/91   Pulse 76   Temp 98.7 F (37.1 C) (Oral)   Ht 5\' 4"  (1.626 m)   Wt (!) 319 lb 12.8 oz (145.1 kg)   BMI 54.89 kg/m   General: Well-nourished, well-developed in no acute distress.  Eyes: No icterus. Conjunctivae pink. Mouth: Oropharyngeal mucosa moist and pink , no lesions erythema or exudate. Neck: Supple, Trachea midline Abdomen: Bowel sounds are normal, nontender, nondistended, no hepatosplenomegaly or masses, no abdominal bruits or hernia , no rebound or guarding.   Extremities: No lower extremity edema. No  clubbing or deformities. Neuro: Alert and oriented x 3.  Grossly intact. Skin: Warm and dry, no jaundice.   Psych: Alert and cooperative, normal mood and affect.   Labs: CMP     Component Value Date/Time   NA 140 01/17/2018 1453   NA 144 06/24/2017 1439   NA 139 06/27/2014 1356   K 4.8 01/17/2018 1453   K 3.9 06/27/2014 1356   CL 110 01/17/2018 1453   CL 106 06/27/2014 1356   CO2 23 01/17/2018 1453   CO2 28 06/27/2014  1356   GLUCOSE 107 (H) 01/17/2018 1453   GLUCOSE 89 06/27/2014 1356   BUN 25 (H) 01/17/2018 1453   BUN 12 06/24/2017 1439   BUN 7 06/27/2014 1356   CREATININE 1.00 05/15/2019 1459   CREATININE 0.90 10/15/2017 1034   CALCIUM 9.2 01/17/2018 1453   CALCIUM 8.3 (L) 06/27/2014 1356   PROT 6.6 05/16/2019 1626   PROT 6.9 06/27/2014 1356   ALBUMIN 4.3 05/16/2019 1626   ALBUMIN 3.8 06/27/2014 1356   AST 15 05/16/2019 1626   AST 15 06/27/2014 1356   ALT 11 05/16/2019 1626   ALT 20 06/27/2014 1356   ALKPHOS 141 (H) 11/01/2019 1359   ALKPHOS 105 06/27/2014 1356   BILITOT 0.3 05/16/2019 1626   BILITOT 0.4 06/27/2014 1356   GFRNONAA 43 (L) 01/17/2018 1453   GFRNONAA 77 10/15/2017 1034   GFRAA 50 (L) 01/17/2018 1453   GFRAA 89 10/15/2017 1034   Lab Results  Component Value Date   WBC 6.5 05/16/2019   HGB 12.7 05/16/2019   HCT 38.7 05/16/2019   MCV 85 05/16/2019   PLT 286 05/16/2019    Imaging Studies: No results found.  Assessment and Plan:   Jody Lee is a 49 y.o. y/o female with history of Roux-en-Y gastric bypass here for follow-up of dysphagia which has now resolved  Symptoms have resolved EGD is reassuring and negative If symptoms recur patient advised to let us know  Patient states she thinks her symptoms may have been related to anxiety as symptoms have resolved since knowing that the EGD is normal.  She will call us back if symptoms recur.    Dr Melodie Bouillon

## 2020-01-09 ENCOUNTER — Other Ambulatory Visit: Payer: Self-pay

## 2020-01-09 ENCOUNTER — Other Ambulatory Visit: Payer: Self-pay | Admitting: *Deleted

## 2020-01-09 MED ORDER — TRAMADOL HCL 50 MG PO TABS: ORAL_TABLET | ORAL | 0 refills | Status: DC

## 2020-02-13 ENCOUNTER — Other Ambulatory Visit: Payer: Self-pay | Admitting: Neurology

## 2020-02-13 DIAGNOSIS — B029 Zoster without complications: Secondary | ICD-10-CM

## 2020-04-16 ENCOUNTER — Telehealth: Payer: Self-pay | Admitting: *Deleted

## 2020-04-16 NOTE — Telephone Encounter (Signed)
Received fax from [employer] that PA modafinil on file set to expire soon. Completed PA on CMM. Key: [id number]. Waiting on determination from [facility].

## 2020-04-16 NOTE — Telephone Encounter (Signed)
equest Reference Number: [id number]. MODAFINIL TAB 200MG  is approved through 10/14/2020. Your patient may now fill this prescription and it will be covered.

## 2020-05-16 ENCOUNTER — Other Ambulatory Visit: Payer: Self-pay | Admitting: Neurology

## 2020-05-20 ENCOUNTER — Encounter: Payer: Self-pay | Admitting: Neurology

## 2020-05-20 ENCOUNTER — Ambulatory Visit (INDEPENDENT_AMBULATORY_CARE_PROVIDER_SITE_OTHER): Payer: 59 | Admitting: Neurology

## 2020-05-20 ENCOUNTER — Other Ambulatory Visit: Payer: Self-pay

## 2020-05-20 VITALS — BP 152/88 | HR 67 | Ht 64.0 in | Wt 314.5 lb

## 2020-05-20 DIAGNOSIS — Z5181 Encounter for therapeutic drug level monitoring: Secondary | ICD-10-CM | POA: Diagnosis not present

## 2020-05-20 DIAGNOSIS — Z79899 Other long term (current) drug therapy: Secondary | ICD-10-CM

## 2020-05-20 DIAGNOSIS — G35 Multiple sclerosis: Secondary | ICD-10-CM

## 2020-05-20 MED ORDER — TRAMADOL HCL 50 MG PO TABS: ORAL_TABLET | ORAL | 1 refills | Status: DC

## 2020-05-20 NOTE — Progress Notes (Signed)
[facility]  PATIENT: Jody Lee DOB: 1971-04-04  REFERRING DOCTOR OR PCP:  Venora Maples SOURCE: patient and labs, notes in EMR and MRI on PACS ________________________________   HISTORICAL  CHIEF COMPLAINT:  Chief Complaint  Patient presents with  . Follow-up    RM 13, alone. Last seen 11/16/2019.   . Multiple Sclerosis    On Ocrevus. Last: 04/09/20. Next: 10/21/20. Receives q 6 months at [facility] with intrafusion.   . Weight loss/fatigue    Take modafinil. she stopped phentermine, made her heart race.  . Pain    Takes tramadol    HISTORY OF PRESENT ILLNESS:  Jody Lee is a 49 y.o. woman with relapsing remitting MS diagnosed in 2005.     Update 05/20/2020: She is on Ocrevus and had her last infusion in February 2021.    IgG/IgM was normal 05/16/2019.    Her gait is stable with a mild right foot drop.    She has some dysesthetic pain, helped by gabapentin.   She is noting more spasticity in her legs and sometimes in her right hand..   Movements in bed trigger them, especially if the day was more active.  If this happens she takes 1-2 baclofen's.    She has bowel urgency but no incontinence.   No UTI.  Vision is stable.   She sees ophthalmology next week.     She had gained weight (80 pounds in last 24 months).   Weight is stable at 314 or 315 today and April 2021.  Phentermine was not well tolerated (palpitations).   She had bariatric surgery (Roex en Y) in the past and had stable weight for a few years.     She is more tired.   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   She is much more.   She sleeps well most nights but is sleeping less due to the hours.   She is tired during the day and takes naps .   Provigil helps sleepiness.  Phentermine did not help sleepiness.   PSG in 11/2018 did not show OSA (AHI = 3.3)     She had her first vaccination for Covid-19 in late March and April.     EPWORTH SLEEPINESS SCALE  On a scale of 0 - 3  what is the chance of dozing:  Sitting and Reading:  1    Watching TV:   2 Sitting inactive in a public place: 0 Passenger in car for one hour: 0 Lying down to rest in the afternoon: 2 Sitting and talking to someone: 0 Sitting quietly after lunch:  0 In a car, stopped in traffic:  0  Total (out of 24):   5/24   Not sleepy.     MS History:   She was diagnosed with MS in 2005 after presenting with a spinal cord syndrome. She started with numbness and pain in the feet that worked its way up to her abdomen over a couple days.   Initially, she had an MRI of the spine that showed a plaque consistent with her symptoms. She was referred to Dr. Maple Hudson. He did a lumbar puncture and ordered an MRI of the brain. Both were consistent with multiple sclerosis and I started to see her. Initially, she was placed on Betaseron and did very well for the first few years. Then around 2011 or 2012, she had an exacerbation and testing showed that she had developed antibodies against Betaseron.  Therefore, she was switched to Gilenya at that time. She has been on Gilenya for the last 4+ years and has done well. Specifically, she has not reported any difficulties with exacerbations and she tolerates the medication well.   Her last MRI was performed at [facility] November 2015.  MRI of the brain from 03/02/2013 showed  typical periventricular T2/FLAIR hyperintense foci, predominantly in the periventricular white matter.   There were no acute foci.  MRI in 06/15/2017 showed a new enahncing lesion so she switched to Ocrevus  Late 2018.   MRI of the brain 05/15/2019 showed no new lesions.  MRI of the brain 04/17/2018 showed no new lesions.  Enhancement previously seen on the 06/15/2017 MRI had resolved.  MRI of the brain 06/15/2017 showed 1 enhancing lesion in the right periatrial white matter not present on the 2017 MRI.  The MRI was otherwise unchanged.  MRI of the cervical spine 06/17/2017 showed about 5 or 6 T2  hyperintense foci within the spinal cord located at C2, C2-C3, C4, C5, C6-C7 and T2-T3.  None of the foci were enhanced.  MRI was felt to be technically better than the 2017 MRI and some interval change could not be ruled out.  Other: We did a PSG 11/2018 but she had only negligible OSA with an AHI = 3.3.   An optional oral appliance was discussed for her snoring.    REVIEW OF SYSTEMS: Constitutional: No fevers, chills, sweats, or change in appetite.   She has fatigue. Eyes: No visual changes, double vision, eye pain Ear, nose and throat: No hearing loss, ear pain, nasal congestion, sore throat Cardiovascular: No chest pain, palpitations Respiratory: No shortness of breath at rest or with exertion.   No wheezes GastrointestinaI: No nausea, vomiting, diarrhea, abdominal pain, fecal incontinence Genitourinary: No dysuria, urinary retention or frequency.  No nocturia. Musculoskeletal: No neck pain, back pain Integumentary: No rash, pruritus, skin lesions Neurological: as above Psychiatric: There is depression and anxiety.  She is tearful at times. Endocrine: No palpitations, diaphoresis, change in appetite, change in weigh or increased thirst Hematologic/Lymphatic: No anemia, purpura, petechiae. Allergic/Immunologic: No itchy/runny eyes, nasal congestion, recent allergic reactions, rashes  ALLERGIES: Allergies  Allergen Reactions  . Nsaids Other (See Comments)    Not supposed to take d/t bariatric surgery   . Phentermine     Made her heart race  . Ciprofloxacin Rash    Felt like heat/burning rash    HOME MEDICATIONS:  Current Outpatient Medications:  .  acetaminophen (TYLENOL) 325 MG tablet, Take 650 mg by mouth every 4 (four) hours as needed., Disp: , Rfl:  .  baclofen (LIORESAL) 10 MG tablet, TAKE 1 TABLET(10 MG) BY MOUTH THREE TIMES DAILY, Disp: 270 tablet, Rfl: 3 .  gabapentin (NEURONTIN) 100 MG capsule, TAKE 1 CAPSULE(100 MG) BY MOUTH THREE TIMES DAILY, Disp: 90 capsule,  Rfl: 2 .  loratadine (CLARITIN) 10 MG tablet, Take 10 mg by mouth daily., Disp: , Rfl:  .  modafinil (PROVIGIL) 200 MG tablet, TAKE 1 TABLET BY MOUTH DAILY, Disp: 30 tablet, Rfl: 5 .  Multiple Vitamin (MULTI VITAMIN DAILY PO), Take by mouth., Disp: , Rfl:  .  ocrelizumab 600 mg in sodium chloride 0.9 % 500 mL, Inject 600 mg into the vein once., Disp: , Rfl:  .  traMADol (ULTRAM) 50 MG tablet, 1 pill po bid prn, Disp: 60 tablet, Rfl: 0 .  Vitamin D, Ergocalciferol, (DRISDOL) 1.25 MG (50000 UNIT) CAPS capsule, Take 1 capsule (  50,000 Units total) by mouth every 7 (seven) days., Disp: 13 capsule, Rfl: 3 No current facility-administered medications for this visit.  Facility-Administered Medications Ordered in Other Visits:  .  gadopentetate dimeglumine (MAGNEVIST) injection 20 mL, 20 mL, Intravenous, Once PRN, Sydelle Sherfield, Pearletha Furl, MD  PAST MEDICAL HISTORY: Past Medical History:  Diagnosis Date  . Anemia   . Hypertension   . Menorrhagia   . MS (multiple sclerosis) (HCC)   . Shingles    right side of chest and back  . Vision abnormalities   . Vitamin B 12 deficiency   . Vitamin D deficiency     PAST SURGICAL HISTORY: Past Surgical History:  Procedure Laterality Date  . CHOLECYSTECTOMY    . ESOPHAGOGASTRODUODENOSCOPY (EGD) WITH PROPOFOL N/A 12/18/2019   Procedure: ESOPHAGOGASTRODUODENOSCOPY (EGD) WITH PROPOFOL;  Surgeon: Pasty Spillers, MD;  Location: [facility] ENDOSCOPY;  Service: Endoscopy;  Laterality: N/A;  . GASTRIC BYPASS      FAMILY HISTORY: Family History  Problem Relation Age of Onset  . Stroke Mother   . Heart disease Mother   . Asthma Mother   . Diabetes Mother   . Hypertension Father   . Heart disease Father   . Diabetes Father   . Asthma Brother   . Alcoholism Brother   . Obesity Brother   . Heart disease Brother   . Diabetes Maternal Grandmother   . Lung cancer Maternal Grandfather   . Breast cancer Neg Hx     SOCIAL HISTORY:  Social History    Socioeconomic History  . Marital status: Single    Spouse name: Not on file  . Number of children: 1  . Years of education: Not on file  . Highest education level: Not on file  Occupational History  . Occupation: Magazine features editor  Tobacco Use  . Smoking status: Never Smoker  . Smokeless tobacco: Never Used  Vaping Use  . Vaping Use: Never used  Substance and Sexual Activity  . Alcohol use: No  . Drug use: No  . Sexual activity: Yes    Birth control/protection: Injection  Other Topics Concern  . Not on file  Social History Narrative   Remarried in 2018. Has one daughter, one grandchild and 2 step children. Works from home   Social Determinants of Corporate investment banker Strain:   . Difficulty of Paying Living Expenses: Not on file  Food Insecurity:   . Worried About Programme researcher, broadcasting/film/video in the Last Year: Not on file  . Ran Out of Food in the Last Year: Not on file  Transportation Needs:   . Lack of Transportation (Medical): Not on file  . Lack of Transportation (Non-Medical): Not on file  Physical Activity:   . Days of Exercise per Week: Not on file  . Minutes of Exercise per Session: Not on file  Stress:   . Feeling of Stress : Not on file  Social Connections:   . Frequency of Communication with Friends and Family: Not on file  . Frequency of Social Gatherings with Friends and Family: Not on file  . Attends Religious Services: Not on file  . Active Member of Clubs or Organizations: Not on file  . Attends Banker Meetings: Not on file  . Marital Status: Not on file  Intimate Partner Violence:   . Fear of Current or Ex-Partner: Not on file  . Emotionally Abused: Not on file  . Physically Abused: Not on file  . Sexually Abused: Not on file  PHYSICAL EXAM  Vitals:   05/20/20 1121  BP: (!) 152/88  Pulse: 67  Weight: (!) 314 lb 8 oz (142.7 kg)  Height: 5\' 4"  (1.626 m)    Body mass index is 53.98 kg/m.   General: The patient is  well-developed and well-nourished and in no acute distress    Neurologic Exam  Mental status: The patient is alert and oriented x 3 at the time of the examination. The patient has apparent normal recent and remote memory, with an apparently normal attention span and concentration ability.   Speech is normal.  Cranial nerves: Extraocular muscles are normal.  Facial strength and sensation is normal.. Hearing is normal and symmetric. Trapezius strength is normal.   Motor:  Muscle bulk is normal.   Muscle tone is normal.  Strength is 5/5 in the arms and legs.  Sensory: Intact sensation to touch and vibration in the arms and legs.  Coordination: Finger-nose-finger and heel-to-shin is performed well.    Gait and station: Station is normal.  Her gait is normal.  Tandem gait is wide.. Romberg is negative.   Reflexes: Deep tendon reflexes are symmetric and normal bilaterally.       No diagnosis found.   1.   Continue Ocrevus every 6 months.  Check lab work. 2.   Continue Provigil for sleepiness/fatigue.  She was unable to tolerate phentermine.  Renew tramadol for pain 3.   Continue other medications.  Baclofen as needed 4.   Stay active and exercise as tolerated.  We discussed weight loss. 5.   Has history of vitamin B12 and D deficiencies but no recent supplements.   She is s/p gasttric bypass 6.   Return in 6 months or sooner if there are new or worsening neurologic symptoms.    Michal Strzelecki A. , MD, PhD 05/20/2020, 11:31 AM Certified in Neurology, Clinical Neurophysiology, Sleep Medicine, Pain Medicine and Neuroimaging  [facility] [address] [place], [place] [phone]

## 2020-05-21 LAB — CBC WITH DIFFERENTIAL/PLATELET
Basophils Absolute: 0 10*3/uL (ref 0.0–0.2)
Basos: 1 %
EOS (ABSOLUTE): 0 10*3/uL (ref 0.0–0.4)
Eos: 0 %
Hematocrit: 37.6 % (ref 34.0–46.6)
Hemoglobin: 11.8 g/dL (ref 11.1–15.9)
Immature Grans (Abs): 0 10*3/uL (ref 0.0–0.1)
Immature Granulocytes: 0 %
Lymphocytes Absolute: 1.1 10*3/uL (ref 0.7–3.1)
Lymphs: 21 %
MCH: 26.7 pg (ref 26.6–33.0)
MCHC: 31.4 g/dL — ABNORMAL LOW (ref 31.5–35.7)
MCV: 85 fL (ref 79–97)
Monocytes Absolute: 0.6 10*3/uL (ref 0.1–0.9)
Monocytes: 10 %
Neutrophils Absolute: 3.6 10*3/uL (ref 1.4–7.0)
Neutrophils: 68 %
Platelets: 272 10*3/uL (ref 150–450)
RBC: 4.42 x10E6/uL (ref 3.77–5.28)
RDW: 14.5 % (ref 11.7–15.4)
WBC: 5.3 10*3/uL (ref 3.4–10.8)

## 2020-05-21 LAB — IGG, IGA, IGM
IgA/Immunoglobulin A, Serum: 164 mg/dL (ref 87–352)
IgG (Immunoglobin G), Serum: 703 mg/dL (ref 586–1602)
IgM (Immunoglobulin M), Srm: 93 mg/dL (ref 26–217)

## 2020-05-21 LAB — SAR COV2 SEROLOGY (COVID19)AB(IGG),IA: DiaSorin SARS-CoV-2 Ab, IgG: NEGATIVE

## 2020-06-03 ENCOUNTER — Encounter: Payer: Self-pay | Admitting: Family Medicine

## 2020-06-24 ENCOUNTER — Telehealth: Payer: Self-pay | Admitting: *Deleted

## 2020-06-24 NOTE — Telephone Encounter (Signed)
Faxed updated [employer] prescriber service form back at [phone]. Received confirmation. Gave copy to intrafusion and sent copy to MR to be scanned.

## 2020-09-10 ENCOUNTER — Telehealth (INDEPENDENT_AMBULATORY_CARE_PROVIDER_SITE_OTHER): Payer: 59 | Admitting: Family Medicine

## 2020-09-10 ENCOUNTER — Encounter: Payer: Self-pay | Admitting: Family Medicine

## 2020-09-10 ENCOUNTER — Other Ambulatory Visit: Payer: Self-pay

## 2020-09-10 ENCOUNTER — Ambulatory Visit: Payer: Self-pay | Admitting: *Deleted

## 2020-09-10 DIAGNOSIS — J069 Acute upper respiratory infection, unspecified: Secondary | ICD-10-CM

## 2020-09-10 MED ORDER — METHYLPREDNISOLONE 4 MG PO TBPK
ORAL_TABLET | ORAL | 0 refills | Status: DC
Start: 2020-09-10 — End: 2020-11-13

## 2020-09-10 NOTE — Telephone Encounter (Signed)
  Patient is calling to report she has been having mild symptoms- congestion, sinus drainage, weakness, headache since last Wednesday. Patient reports she has had family members in her area that have been diagnosed with COVID- but she has tried not to have direct contact with them. Call to office for virtual appointment to discuss her symptoms and best treatment. Reason for Disposition . HIGH RISK for severe COVID complications (e.g., age > 64 years, obesity with BMI > 25, pregnant, chronic lung disease or other chronic medical condition)  (Exception: Already seen by PCP and no new or worsening symptoms.)  Answer Assessment - Initial Assessment Questions 1. COVID-19 DIAGNOSIS: "Who made your COVID-19 diagnosis? Was it confirmed by a positive lab test?"      Symptoms only 2. COVID-19 EXPOSURE: "Was there any known exposure to COVID-19 before the symptoms began?" Household exposure or close contact with positive COVID-19 patient outside the home (child care, school, work, play or sports).  CDC Definition of close contact: within 6 feet (2 meters) for a total of 15 minutes or more over a 24-hour period.      None known 3. ONSET: "When did the COVID-19 symptoms start?"      Last Wednesday 4. WORST SYMPTOM: "What is your  worst symptom?"      Sinus symptoms- drainage 5. COUGH: "Does your have a cough?" If so, ask, "How bad is the cough?"       Yes- in am- during the day not so much 6. RESPIRATORY DISTRESS: "Describe your  breathing. What does it sound like?" (e.g., wheezing, stridor, grunting, weak cry, unable to speak, retractions, rapid rate, cyanosis)     normal 7. BETTER-SAME-WORSE: "Are you getting better, staying the same or getting worse compared to yesterday?"  If getting worse, ask, "In what way?"     same 8. FEVER: "Do you have a fever?" If so, ask: "What is it, how was it measured, and how long has it been present?"      no 9. OTHER SYMPTOMS: "Do you have any other symptoms?" (e.g., chills  or shaking, sore throat, muscle pains, headache, loss of smell)      Sore throat, headache,weakness 11. HIGHER RISK for COMPLICATIONS with FLU or COVID-19 : "Does your have any chronic medical problems?" (e.g., heart or lung disease, diabetes, asthma, cancer, weak immune system, etc. See that List in Background Information.  Reason: may need antiviral if has positive test for influenza.  Protocols used: CORONAVIRUS (COVID-19) DIAGNOSED OR SUSPECTED-A-AH, CORONAVIRUS (COVID-19) DIAGNOSED OR SUSPECTED-P-AH

## 2020-09-10 NOTE — Patient Instructions (Signed)
It was great to see you!  Our plans for today:  - See below for self-isolation guidelines. You may end your quarantine once you are 10 days from symptom onset and fever free for 24 hours without use of tylenol or ibuprofen.  - If your COVID test is positive, we will be referring you for COVID treatment with either monoclonal antibody or oral antivirals. Someone will be contacting you about scheduling this if you qualify. We are experiencing a Sport and exercise psychologist of this and the oral antivirals as well as a backlog of patients needing treatment so this may cause a delay. - Certainly, if you are having difficulties breathing or unable to keep down fluids, go to the Emergency Department.   Take care and seek immediate care sooner if you develop any concerns.   Dr. Linwood Dibbles     Person Under Monitoring Name: Jody Lee  Location: [address] [place] [address]   Infection Prevention Recommendations for Individuals Confirmed to have, or Being Evaluated for, 2019 Novel Coronavirus (COVID-19) Infection Who Receive Care at Home  Individuals who are confirmed to have, or are being evaluated for, COVID-19 should follow the prevention steps below until a healthcare provider or local or state health department says they can return to normal activities.  Stay home except to get medical care You should restrict activities outside your home, except for getting medical care. Do not go to work, school, or public areas, and do not use public transportation or taxis.  Call ahead before visiting your doctor Before your medical appointment, call the healthcare provider and tell them that you have, or are being evaluated for, COVID-19 infection. This will help the healthcare provider's office take steps to keep other people from getting infected. Ask your healthcare provider to call the local or state health department.  Monitor your symptoms Seek prompt medical attention if your illness is  worsening (e.g., difficulty breathing). Before going to your medical appointment, call the healthcare provider and tell them that you have, or are being evaluated for, COVID-19 infection. Ask your healthcare provider to call the local or state health department.  Wear a facemask You should wear a facemask that covers your nose and mouth when you are in the same room with other people and when you visit a healthcare provider. People who live with or visit you should also wear a facemask while they are in the same room with you.  Separate yourself from other people in your home As much as possible, you should stay in a different room from other people in your home. Also, you should use a separate bathroom, if available.  Avoid sharing household items You should not share dishes, drinking glasses, cups, eating utensils, towels, bedding, or other items with other people in your home. After using these items, you should wash them thoroughly with soap and water.  Cover your coughs and sneezes Cover your mouth and nose with a tissue when you cough or sneeze, or you can cough or sneeze into your sleeve. Throw used tissues in a lined trash can, and immediately wash your hands with soap and water for at least 20 seconds or use an alcohol-based hand rub.  Wash your [employer] your hands often and thoroughly with soap and water for at least 20 seconds. You can use an alcohol-based hand sanitizer if soap and water are not available and if your hands are not visibly dirty. Avoid touching your eyes, nose, and mouth with unwashed hands.  Prevention Steps for Caregivers and Household Members of Individuals Confirmed to have, or Being Evaluated for, COVID-19 Infection Being Cared for in the Home  If you live with, or provide care at home for, a person confirmed to have, or being evaluated for, COVID-19 infection please follow these guidelines to prevent infection:  Follow healthcare provider's  instructions Make sure that you understand and can help the patient follow any healthcare provider instructions for all care.  Provide for the patient's basic needs You should help the patient with basic needs in the home and provide support for getting groceries, prescriptions, and other personal needs.  Monitor the patient's symptoms If they are getting sicker, call his or her medical provider and tell them that the patient has, or is being evaluated for, COVID-19 infection. This will help the healthcare provider's office take steps to keep other people from getting infected. Ask the healthcare provider to call the local or state health department.  Limit the number of people who have contact with the patient  If possible, have only one caregiver for the patient.  Other household members should stay in another home or place of residence. If this is not possible, they should stay  in another room, or be separated from the patient as much as possible. Use a separate bathroom, if available.  Restrict visitors who do not have an essential need to be in the home.  Keep older adults, very young children, and other sick people away from the patient Keep older adults, very young children, and those who have compromised immune systems or chronic health conditions away from the patient. This includes people with chronic heart, lung, or kidney conditions, diabetes, and cancer.  Ensure good ventilation Make sure that shared spaces in the home have good air flow, such as from an air conditioner or an opened window, weather permitting.  Wash your hands often  Wash your hands often and thoroughly with soap and water for at least 20 seconds. You can use an alcohol based hand sanitizer if soap and water are not available and if your hands are not visibly dirty.  Avoid touching your eyes, nose, and mouth with unwashed hands.  Use disposable paper towels to dry your hands. If not available, use  dedicated cloth towels and replace them when they become wet.  Wear a facemask and gloves  Wear a disposable facemask at all times in the room and gloves when you touch or have contact with the patient's blood, body fluids, and/or secretions or excretions, such as sweat, saliva, sputum, nasal mucus, vomit, urine, or feces.  Ensure the mask fits over your nose and mouth tightly, and do not touch it during use.  Throw out disposable facemasks and gloves after using them. Do not reuse.  Wash your hands immediately after removing your facemask and gloves.  If your personal clothing becomes contaminated, carefully remove clothing and launder. Wash your hands after handling contaminated clothing.  Place all used disposable facemasks, gloves, and other waste in a lined container before disposing them with other household waste.  Remove gloves and wash your hands immediately after handling these items.  Do not share dishes, glasses, or other household items with the patient  Avoid sharing household items. You should not share dishes, drinking glasses, cups, eating utensils, towels, bedding, or other items with a patient who is confirmed to have, or being evaluated for, COVID-19 infection.  After the person uses these items, you should wash them thoroughly with soap and  water.  Wash laundry thoroughly  Immediately remove and wash clothes or bedding that have blood, body fluids, and/or secretions or excretions, such as sweat, saliva, sputum, nasal mucus, vomit, urine, or feces, on them.  Wear gloves when handling laundry from the patient.  Read and follow directions on labels of laundry or clothing items and detergent. In general, wash and dry with the warmest temperatures recommended on the label.  Clean all areas the individual has used often  Clean all touchable surfaces, such as counters, tabletops, doorknobs, bathroom fixtures, toilets, phones, keyboards, tablets, and bedside tables, every  day. Also, clean any surfaces that may have blood, body fluids, and/or secretions or excretions on them.  Wear gloves when cleaning surfaces the patient has come in contact with.  Use a diluted bleach solution (e.g., dilute bleach with 1 part bleach and 10 parts water) or a household disinfectant with a label that says EPA-registered for coronaviruses. To make a bleach solution at home, add 1 tablespoon of bleach to 1 quart (4 cups) of water. For a larger supply, add  cup of bleach to 1 gallon (16 cups) of water.  Read labels of cleaning products and follow recommendations provided on product labels. Labels contain instructions for safe and effective use of the cleaning product including precautions you should take when applying the product, such as wearing gloves or eye protection and making sure you have good ventilation during use of the product.  Remove gloves and wash hands immediately after cleaning.  Monitor yourself for signs and symptoms of illness Caregivers and household members are considered close contacts, should monitor their health, and will be asked to limit movement outside of the home to the extent possible. Follow the monitoring steps for close contacts listed on the symptom monitoring form.   ? If you have additional questions, contact your local health department or call the epidemiologist on call at [phone] (available 24/7). ? This guidance is subject to change. For the most up-to-date guidance from [facility], please refer to their website: YouBlogs.pl

## 2020-09-10 NOTE — Telephone Encounter (Signed)
Called patient and she said that she has a visit with Crissman today at 3pm.

## 2020-09-10 NOTE — Progress Notes (Signed)
Virtual Visit via Video Note  I connected with Jody Lee on 09/10/20 at  3:00 PM EST by a video enabled telemedicine application and verified that I am speaking with the correct person using two identifiers.  Location: Patient: home Provider: [facility]   I discussed the limitations of evaluation and management by telemedicine and the availability of in person appointments. The patient expressed understanding and agreed to proceed.  History of Present Illness:  UPPER RESPIRATORY TRACT INFECTION - symptom onset 1/27 - has received 3 doses of Pfizer - headache, fatigue since Saturday - husband also with symptoms  Worst symptom: Fever: no Cough: yes Shortness of breath: mainly with talking Wheezing: no Chest pain: no Chest tightness: no Chest congestion: yes Nasal congestion: yes Runny nose: yes Sneezing: yes Sore throat: yes Headache: yes Ear pain: no  Ear pressure: no  Eyes red/itching:no Eye drainage/crusting: no  Vomiting: no Fatigue: yes Sick contacts: yes Recurrent sinusitis: no Relief with OTC cold/cough medications: minimal  Treatments attempted: cold/sinus     Observations/Objective:  Patient had trouble connecting to video visit, entirety of visit conducted over the phone.  Speaks in full sentences, no respiratory distress.   Assessment and Plan:  Viral URI With mild sx and sick contacts. Will test for COVID. Would be a candidate for COVID tx referral if positive. Given lack of symptom relief with OTC cold/sinus, will trial steroid course. Reviewed self-quarantine guidelines and emergency precautions.     I discussed the assessment and treatment plan with the patient. The patient was provided an opportunity to ask questions and all were answered. The patient agreed with the plan and demonstrated an understanding of the instructions.   The patient was advised to call back or seek an in-person evaluation if the symptoms worsen or if the condition fails to  improve as anticipated.  I provided 13 minutes of non-face-to-face time during this encounter.   Caro Laroche, DO

## 2020-09-11 ENCOUNTER — Other Ambulatory Visit: Payer: 59

## 2020-09-13 ENCOUNTER — Encounter: Payer: Self-pay | Admitting: Family Medicine

## 2020-09-13 LAB — SARS-COV-2, NAA 2 DAY TAT

## 2020-09-13 LAB — NOVEL CORONAVIRUS, NAA: SARS-CoV-2, NAA: NOT DETECTED

## 2020-09-16 ENCOUNTER — Telehealth: Payer: Self-pay | Admitting: *Deleted

## 2020-09-16 NOTE — Telephone Encounter (Signed)
Request Reference Number: [id number]. MODAFINIL TAB 200MG  is approved through 03/16/2021. Your patient may now fill this prescription and it will be covered.

## 2020-09-16 NOTE — Telephone Encounter (Signed)
Received notice from [facility] that PA modafinil will expire soon. I initiated PA on CMM. [email or web address] Case ID: [id number]. Waiting on determination.

## 2020-11-06 ENCOUNTER — Other Ambulatory Visit: Payer: Self-pay | Admitting: Neurology

## 2020-11-13 ENCOUNTER — Telehealth (INDEPENDENT_AMBULATORY_CARE_PROVIDER_SITE_OTHER): Payer: 59 | Admitting: Family Medicine

## 2020-11-13 ENCOUNTER — Encounter: Payer: Self-pay | Admitting: Family Medicine

## 2020-11-13 VITALS — Ht 64.0 in | Wt 310.0 lb

## 2020-11-13 DIAGNOSIS — J0110 Acute frontal sinusitis, unspecified: Secondary | ICD-10-CM | POA: Diagnosis not present

## 2020-11-13 MED ORDER — AMOXICILLIN-POT CLAVULANATE 875-125 MG PO TABS: 1.0000 | ORAL_TABLET | Freq: Two times a day (BID) | ORAL | 0 refills | Status: DC

## 2020-11-13 MED ORDER — FLUTICASONE PROPIONATE 50 MCG/ACT NA SUSP: 2.0000 | Freq: Every day | NASAL | 3 refills | Status: DC

## 2020-11-13 NOTE — Progress Notes (Signed)
Subjective:    Patient ID: Jody Lee, female    DOB: Sep 21, 1970, 50 y.o.   MRN: [id number]  Jody Lee is a 50 y.o. female presenting on 11/13/2020 for Cough and Nasal Congestion  Virtual / Telehealth Encounter - Video Visit via MyChart The purpose of this virtual visit is to provide medical care while limiting exposure to the novel coronavirus (COVID19) for both patient and office staff.  Consent was obtained for remote visit:  Yes.   Answered questions that patient had about telehealth interaction:  Yes.   I discussed the limitations, risks, security and privacy concerns of performing an evaluation and management service by video/telephone. I also discussed with the patient that there may be a patient responsible charge related to this service. The patient expressed understanding and agreed to proceed.  Patient Location: Home Provider Location: [facility] (Office)  Participants in virtual visit: - Patient: Jody Lee - CMA: Burnell Blanks, CMA - Provider: Dr Althea Charon   HPI   Sinusitis 1 week ago onset symptoms sinus, allergy symptoms, worsening productive cough with post nasal drainage. Symptoms worse at night with throat and can impact breathing. Admits feeling winded and tired. She is talking all day long at work and it impacts her. Tried home remedy cold & flu, Emergen-C Airborne. Tried OTC Robitussin. Not using nasal spray or inhaler. Tried a brief round of prednisone from previous visit 09/2020 Home COVID test on Saturday negative Upcoming Jury Duty next week. Sick contact at home with husband drainage / coughing. Also did home covid test negative. Daughter has similar symptoms, about 1 month ago with similar symptoms, hers resolved w/ antibiotic Denies any fevers  COVID Vaccine updated  Pfizer - Completed 10/13/19, and 11/14/19, and October 2021.   Depression screen [facility] 2/9 06/07/2019 06/07/2019 09/13/2018  Decreased Interest 0 0 1  Down,  Depressed, Hopeless 0 0 1  PHQ - 2 Score 0 0 2  Altered sleeping - - 1  Tired, decreased energy - - 1  Change in appetite - - 0  Feeling bad or failure about yourself  - - 0  Trouble concentrating - - 1  Moving slowly or fidgety/restless - - 0  Suicidal thoughts - - 0  PHQ-9 Score - - 5  Difficult doing work/chores - - Somewhat difficult    Social History   Tobacco Use  . Smoking status: Never Smoker  . Smokeless tobacco: Never Used  Vaping Use  . Vaping Use: Never used  Substance Use Topics  . Alcohol use: No  . Drug use: No    Review of Systems Per HPI unless specifically indicated above     Objective:    Ht 5\' 4"  (1.626 m)   Wt (!) 310 lb (140.6 kg)   BMI 53.21 kg/m   Wt Readings from Last 3 Encounters:  11/13/20 (!) 310 lb (140.6 kg)  05/20/20 (!) 314 lb 8 oz (142.7 kg)  01/01/20 (!) 319 lb 12.8 oz (145.1 kg)    Physical Exam   Note examination was completely remotely via video observation objective data only  Gen - well-appearing, no acute distress or apparent pain, comfortable HEENT - eyes appear clear without discharge or redness Heart/Lungs - cannot examine virtually - coughing at times. Speaks full sentences, no labored breathing. Skin - face visible today- no rash Neuro - awake, alert, oriented Psych - not anxious appearing   Results for orders placed or performed in visit on 09/10/20  Novel  Coronavirus, NAA ([facility])   Specimen: Nasopharyngeal(NP) swabs in vial transport medium  Result Value Ref Range   SARS-CoV-2, NAA Not Detected Not Detected  SARS-COV-2, NAA 2 DAY TAT  Result Value Ref Range   SARS-CoV-2, NAA 2 DAY TAT Performed       Assessment & Plan:   Problem List Items Addressed This Visit   None   Visit Diagnoses    Acute non-recurrent frontal sinusitis    -  Primary   Relevant Medications   amoxicillin-clavulanate (AUGMENTIN) 875-125 MG tablet   fluticasone (FLONASE) 50 MCG/ACT nasal spray      Consistent with acute  frontal rhinosinusitis, likely initially viral URI vs allergic rhinitis component with worsening concern for bacterial infection duration >1 week and sick contact  Plan: 1 Start Augmentin 875-125mg  PO BID x 10 days 2. Start nasal steroid Flonase 2 sprays in each nostril daily for 4-6 weeks, may repeat course seasonally or as needed 3. Continue OTC therapy Return criteria reviewed   Meds ordered this encounter  Medications  . amoxicillin-clavulanate (AUGMENTIN) 875-125 MG tablet    Sig: Take 1 tablet by mouth 2 (two) times daily.    Dispense:  20 tablet    Refill:  0  . fluticasone (FLONASE) 50 MCG/ACT nasal spray    Sig: Place 2 sprays into both nostrils daily. Use for 4-6 weeks then stop and use seasonally or as needed.    Dispense:  16 g    Refill:  3      Follow up plan: Return in about 1 week (around 11/20/2020), or if symptoms worsen or fail to improve, for sinusitis.   Patient verbalizes understanding with the above medical recommendations including the limitation of remote medical advice.  Specific follow-up and call-back criteria were given for patient to follow-up or seek medical care more urgently if needed.  Total duration of direct patient care provided via video conference: 9 minutes    Saralyn Pilar, DO [facility] 11/13/2020, 10:59 AM

## 2020-11-13 NOTE — Patient Instructions (Addendum)
1. It sounds like you have a Sinusitis (Bacterial Infection) - this most likely started as an Upper Respiratory Virus that has settled into an infection. Allergies can also cause this. - Start Augmentin 1 pill twice daily (breakfast and dinner, with food and plenty of water) for 10 days, complete entire course, do not stop early even if feeling better - Start nasal steroid Flonase 2 sprays in each nostril daily for 4-6 weeks, may repeat course seasonally or as needed Recommend to keep using Nasal Saline spray multiple times a day to help flush out congestion and clear sinuses - Improve hydration by drinking plenty of clear fluids (water, gatorade) to reduce secretions and thin congestion - Congestion draining down throat can cause irritation. May try warm herbal tea with honey, cough drops - Can take Tylenol or Ibuprofen as needed for fevers - May continue over the counter cold medicine as you are, I would not use any decongestant or mucinex longer than 7 days.  If you develop persistent fever >101F for at least 3 consecutive days, headaches with sinus pain or pressure or persistent earache, please schedule a follow-up evaluation within next few days to week.   Please schedule a Follow-up Appointment to: Return in about 1 week (around 11/20/2020), or if symptoms worsen or fail to improve, for sinusitis.  If you have any other questions or concerns, please feel free to call the office or send a message through MyChart. You may also schedule an earlier appointment if necessary.  Additionally, you may be receiving a survey about your experience at our office within a few days to 1 week by e-mail or mail. We value your feedback.  Saralyn Pilar, DO [facility], [place]

## 2020-11-18 ENCOUNTER — Ambulatory Visit: Payer: 59 | Admitting: Family Medicine

## 2020-11-26 ENCOUNTER — Encounter: Payer: Self-pay | Admitting: Family Medicine

## 2020-11-26 ENCOUNTER — Ambulatory Visit (INDEPENDENT_AMBULATORY_CARE_PROVIDER_SITE_OTHER): Payer: 59 | Admitting: Family Medicine

## 2020-11-26 VITALS — BP 127/82 | HR 76 | Ht 64.0 in | Wt 318.0 lb

## 2020-11-26 DIAGNOSIS — G35 Multiple sclerosis: Secondary | ICD-10-CM

## 2020-11-26 DIAGNOSIS — R5383 Other fatigue: Secondary | ICD-10-CM | POA: Diagnosis not present

## 2020-11-26 DIAGNOSIS — E559 Vitamin D deficiency, unspecified: Secondary | ICD-10-CM

## 2020-11-26 DIAGNOSIS — R269 Unspecified abnormalities of gait and mobility: Secondary | ICD-10-CM

## 2020-11-26 DIAGNOSIS — Z79899 Other long term (current) drug therapy: Secondary | ICD-10-CM

## 2020-11-26 MED ORDER — MODAFINIL 200 MG PO TABS: 200.0000 mg | ORAL_TABLET | Freq: Every day | ORAL | 5 refills | Status: DC

## 2020-11-26 NOTE — Progress Notes (Signed)
I have read the note, and I agree with the clinical assessment and plan.  Garfield Coiner A. Hibba Schram, MD, PhD, FAAN Certified in Neurology, Clinical Neurophysiology, Sleep Medicine, Pain Medicine and Neuroimaging  [facility] [address], [address] [place], [place] [address] [phone]  

## 2020-11-26 NOTE — Progress Notes (Signed)
Chief Complaint  Patient presents with  . Follow-up    RM 1 alone  Pt is well, no new symptoms thing are about the same      HISTORY OF PRESENT ILLNESS: 11/26/20 ALL:  Jody Lee is a 50 y.o. female here today for follow up for RRMS. She continues Ocrevus infusions. Last infusion in March. She is tolerating well. Last MRI 05/2019 was stable.   She feels that she is doing fairly well. No new or exacerbating symptoms. She does have some R>L foot drop and spastic gait. No falls or assistive devices. She takes baclofen as needed, usually 20mg  in morning and 20mg  in evenings.  She also takes gabapentin 100mg  TID as needed for dysesthesias. Usually takes with baclofen.   Mood is good. She is sleeping well. She continues to work. Modafinil 200mg  daily works well. Helps with cognition and fatigue.   Tramadol 50mg  BID usually used about a month before her infusion when pain worsens. Has some MS hug, back and shoulder pain. She has a refill remaining from previous visit with Dr Epimenio FootSater.   Vitamin d 50,000iu once weekly. She is followed regularly by Dr Cheron SchaumannKarmalegos, PCP,  in [place].   She has a total of three Covid vaccines Proofreader(Pfizer) . Booster was given in October 2021. Last IgG negative for antibodies in 05/2020.    HISTORY (copied from Dr Bonnita HollowSater's previous note)  Jody Lee is a 50 y.o. woman with relapsing remitting MS diagnosed in 2005.     Update 05/20/2020: She is on Ocrevus and had her last infusion in February 2021.    IgG/IgM was normal 05/16/2019.    Her gait is stable with a mild right foot drop.    She has some dysesthetic pain, helped by gabapentin.   She is noting more spasticity in her legs and sometimes in her right hand..   Movements in bed trigger them, especially if the day was more active.  If this happens she takes 1-2 baclofen's.    She has bowel urgency but no incontinence.   No UTI.  Vision is stable.   She sees ophthalmology next week.     She had gained weight (80  pounds in last 24 months).   Weight is stable at 314 or 315 today and April 2021.  Phentermine was not well tolerated (palpitations).   She had bariatric surgery (Roex en Y) in the past and had stable weight for a few years.     She is more tired.   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   She is much more.   She sleeps well most nights but is sleeping less due to the hours.   She is tired during the day and takes naps .   Provigil helps sleepiness.  Phentermine did not help sleepiness.   PSG in 11/2018 did not show OSA (AHI = 3.3)     She had her first vaccination for Covid-19 in late March and April.     EPWORTH SLEEPINESS SCALE  On a scale of 0 - 3 what is the chance of dozing:  Sitting and Reading:                          1  Watching TV:                                      2 Sitting inactive in a public place:       0 Passenger in car for one hour:          0 Lying down to rest in the afternoon:  2 Sitting and talking to someone:         0 Sitting quietly after lunch:                  0 In a car, stopped in traffic:                 0  Total (out of 24):   5/24   Not sleepy.     MS History:   She was diagnosed with MS in 2005 after presenting with a spinal cord syndrome. She started with numbness and pain in the feet that worked its way up to her abdomen over a couple days.   Initially, she had an MRI of the spine that showed a plaque consistent with her symptoms. She was referred to Dr. Maple Hudson. He did a lumbar puncture and ordered an MRI of the brain. Both were consistent with multiple sclerosis and I started to see her. Initially, she was placed on Betaseron and did very well for the first few years. Then around 2011 or 2012, she had an exacerbation and testing showed that she had developed antibodies against Betaseron. Therefore, she was switched to Gilenya at that time. She has been on Gilenya for the last 4+  years and has done well. Specifically, she has not reported any difficulties with exacerbations and she tolerates the medication well.   Her last MRI was performed at [facility] November 2015.  MRI of the brain from 03/02/2013 showed  typical periventricular T2/FLAIR hyperintense foci, predominantly in the periventricular white matter.   There were no acute foci.  MRI in 06/15/2017 showed a new enahncing lesion so she switched to Ocrevus  Late 2018.   MRI of the brain 05/15/2019 showed no new lesions.  MRI of the brain 04/17/2018 showed no new lesions.  Enhancement previously seen on the 06/15/2017 MRI had resolved.  MRI of the brain 06/15/2017 showed 1 enhancing lesion in the right periatrial white matter not present on the 2017 MRI.  The MRI was otherwise unchanged.  MRI of the cervical spine 06/17/2017 showed about 5 or 6 T2 hyperintense foci within the spinal cord located at C2, C2-C3, C4, C5, C6-C7 and T2-T3.  None of the foci were enhanced.  MRI was felt to be technically better than the 2017 MRI and some interval change could not be ruled out.  Other: We did a PSG 11/2018 but she had only negligible OSA with an AHI = 3.3.   An optional oral appliance was discussed for her snoring.      REVIEW OF SYSTEMS: Out of a complete 14 system review of symptoms, the patient complains only of the following symptoms, foot drop, gait abnormality, numbness, tingling, fatigue, difficulty concentrating, and all other reviewed systems are negative.    ALLERGIES: Allergies  Allergen Reactions  . Nsaids Other (See Comments)    Not supposed to take d/t bariatric surgery   . Phentermine     Made her heart race  . Ciprofloxacin Rash    Felt  like heat/burning rash     HOME MEDICATIONS: Outpatient Medications Prior to Visit  Medication Sig Dispense Refill  . acetaminophen (TYLENOL) 325 MG tablet Take 650 mg by mouth every 4 (four) hours as needed.    Marland Kitchen amoxicillin-clavulanate  (AUGMENTIN) 875-125 MG tablet Take 1 tablet by mouth 2 (two) times daily. 20 tablet 0  . baclofen (LIORESAL) 10 MG tablet TAKE 1 TABLET(10 MG) BY MOUTH THREE TIMES DAILY 270 tablet 3  . fluticasone (FLONASE) 50 MCG/ACT nasal spray Place 2 sprays into both nostrils daily. Use for 4-6 weeks then stop and use seasonally or as needed. 16 g 3  . gabapentin (NEURONTIN) 100 MG capsule TAKE 1 CAPSULE(100 MG) BY MOUTH THREE TIMES DAILY 90 capsule 2  . ocrelizumab 600 mg in sodium chloride 0.9 % 500 mL Inject 600 mg into the vein once.    . traMADol (ULTRAM) 50 MG tablet 1 pill po bid prn 60 tablet 1  . Vitamin D, Ergocalciferol, (DRISDOL) 1.25 MG (50000 UNIT) CAPS capsule TAKE 1 CAPSULE BY MOUTH EVERY 7 DAYS 13 capsule 3  . modafinil (PROVIGIL) 200 MG tablet TAKE 1 TABLET BY MOUTH DAILY 30 tablet 5   Facility-Administered Medications Prior to Visit  Medication Dose Route Frequency Provider Last Rate Last Admin  . gadopentetate dimeglumine (MAGNEVIST) injection 20 mL  20 mL Intravenous Once PRN Sater, Pearletha Furl, MD         PAST MEDICAL HISTORY: Past Medical History:  Diagnosis Date  . Anemia   . Hypertension   . Menorrhagia   . MS (multiple sclerosis) (HCC)   . Shingles    right side of chest and back  . Vision abnormalities   . Vitamin B 12 deficiency   . Vitamin D deficiency      PAST SURGICAL HISTORY: Past Surgical History:  Procedure Laterality Date  . CHOLECYSTECTOMY    . ESOPHAGOGASTRODUODENOSCOPY (EGD) WITH PROPOFOL N/A 12/18/2019   Procedure: ESOPHAGOGASTRODUODENOSCOPY (EGD) WITH PROPOFOL;  Surgeon: Pasty Spillers, MD;  Location: [facility] ENDOSCOPY;  Service: Endoscopy;  Laterality: N/A;  . GASTRIC BYPASS       FAMILY HISTORY: Family History  Problem Relation Age of Onset  . Stroke Mother   . Heart disease Mother   . Asthma Mother   . Diabetes Mother   . Hypertension Father   . Heart disease Father   . Diabetes Father   . Asthma Brother   . Alcoholism Brother   .  Obesity Brother   . Heart disease Brother   . Diabetes Maternal Grandmother   . Lung cancer Maternal Grandfather   . Breast cancer Neg Hx      SOCIAL HISTORY: Social History   Socioeconomic History  . Marital status: Married    Spouse name: Not on file  . Number of children: 1  . Years of education: Not on file  . Highest education level: Not on file  Occupational History  . Occupation: Magazine features editor  Tobacco Use  . Smoking status: Never Smoker  . Smokeless tobacco: Never Used  Vaping Use  . Vaping Use: Never used  Substance and Sexual Activity  . Alcohol use: No  . Drug use: No  . Sexual activity: Yes    Birth control/protection: Injection  Other Topics Concern  . Not on file  Social History Narrative   Remarried in 2018. Has one daughter, one grandchild and 2 step children. Works from home   Social Determinants of Corporate investment banker Strain: Not on file  Food Insecurity: Not on file  Transportation Needs: Not on file  Physical Activity: Not on file  Stress: Not on file  Social Connections: Not on file  Intimate Partner Violence: Not on file      PHYSICAL EXAM  Vitals:   11/26/20 1058  BP: 127/82  Pulse: 76  Weight: (!) 318 lb (144.2 kg)  Height: 5\' 4"  (1.626 m)   Body mass index is 54.58 kg/m.   Generalized: Well developed, in no acute distress  Cardiology: normal rate and rhythm, no murmur auscultated  Respiratory: clear to auscultation bilaterally    Neurological examination  Mentation: Alert oriented to time, place, history taking. Follows all commands speech and language fluent Cranial nerve II-XII: Pupils were equal round reactive to light. Extraocular movements were full, visual field were full on confrontational test. Facial sensation and strength were normal. Head turning and shoulder shrug  were normal and symmetric. Motor: The motor testing reveals 5 over 5 strength of all 4 extremities. Good symmetric motor tone is noted  throughout.  Sensory: Sensory testing is intact to soft touch on all 4 extremities. No evidence of extinction is noted.  Coordination: Cerebellar testing reveals good finger-nose-finger and heel-to-shin bilaterally.  Gait and station: Gait is normal. No foot drop noted  Reflexes: Deep tendon reflexes are symmetric and normal bilaterally.     DIAGNOSTIC DATA (LABS, IMAGING, TESTING) - I reviewed patient records, labs, notes, testing and imaging myself where available.  Lab Results  Component Value Date   WBC 5.3 05/20/2020   HGB 11.8 05/20/2020   HCT 37.6 05/20/2020   MCV 85 05/20/2020   PLT 272 05/20/2020      Component Value Date/Time   NA 140 01/17/2018 1453   NA 144 06/24/2017 1439   NA 139 06/27/2014 1356   K 4.8 01/17/2018 1453   K 3.9 06/27/2014 1356   CL 110 01/17/2018 1453   CL 106 06/27/2014 1356   CO2 23 01/17/2018 1453   CO2 28 06/27/2014 1356   GLUCOSE 107 (H) 01/17/2018 1453   GLUCOSE 89 06/27/2014 1356   BUN 25 (H) 01/17/2018 1453   BUN 12 06/24/2017 1439   BUN 7 06/27/2014 1356   CREATININE 1.00 05/15/2019 1459   CREATININE 0.90 10/15/2017 1034   CALCIUM 9.2 01/17/2018 1453   CALCIUM 8.3 (L) 06/27/2014 1356   PROT 6.6 05/16/2019 1626   PROT 6.9 06/27/2014 1356   ALBUMIN 4.3 05/16/2019 1626   ALBUMIN 3.8 06/27/2014 1356   AST 15 05/16/2019 1626   AST 15 06/27/2014 1356   ALT 11 05/16/2019 1626   ALT 20 06/27/2014 1356   ALKPHOS 141 (H) 11/01/2019 1359   ALKPHOS 105 06/27/2014 1356   BILITOT 0.3 05/16/2019 1626   BILITOT 0.4 06/27/2014 1356   GFRNONAA 43 (L) 01/17/2018 1453   GFRNONAA 77 10/15/2017 1034   GFRAA 50 (L) 01/17/2018 1453   GFRAA 89 10/15/2017 1034   No results found for: CHOL, HDL, LDLCALC, LDLDIRECT, TRIG, CHOLHDL Lab Results  Component Value Date   HGBA1C 5.3 10/15/2017   Lab Results  Component Value Date   VITAMINB12 252 11/16/2019   Lab Results  Component Value Date   TSH 2.710 05/16/2019    No flowsheet data  found.   No flowsheet data found.   ASSESSMENT AND PLAN  50 y.o. year old female  has a past medical history of Anemia, Hypertension, Menorrhagia, MS (multiple sclerosis) (HCC), Shingles, Vision abnormalities, Vitamin B 12 deficiency, and Vitamin D deficiency. here  with   Relapsing remitting multiple sclerosis (HCC) - Plan: CBC with Differential/Platelets, IgG, IgA, IgM, Vitamin D, 25-hydroxy, SAR CoV2 Serology (COVID 19)AB(IGG)IA  High risk medication use - Plan: CBC with Differential/Platelets, IgG, IgA, IgM, SAR CoV2 Serology (COVID 19)AB(IGG)IA  Other fatigue  Vitamin D deficiency - Plan: Vitamin D, 25-hydroxy  Gait disturbance   Keshanti is doing well, today. She will continue Ocrevus infusions, last in March, 2022 and next scheduled for 04/2021. We will update labs. Will consider updating MRI at next visit with Dr Epimenio Foot. She will continue baclofen, gabapentin, modafinil, tramadol and vitamin D as prescribed. Healthy lifestyle habits encouraged. She will follow up with Dr Epimenio Foot in 6 months.    Orders Placed This Encounter  Procedures  . CBC with Differential/Platelets  . IgG, IgA, IgM  . Vitamin D, 25-hydroxy  . SAR CoV2 Serology (COVID 19)AB(IGG)IA    Order Specific Question:   Is this test for diagnosis or screening    Answer:   Screening    Order Specific Question:   Symptomatic for COVID-19 as defined by CDC    Answer:   No    Order Specific Question:   Hospitalized for COVID-19    Answer:   No    Order Specific Question:   Admitted to ICU for COVID-19    Answer:   No    Order Specific Question:   Previously tested for COVID-19    Answer:   Yes    Order Specific Question:   Resident in a congregate (group) care setting    Answer:   No    Order Specific Question:   Employed in healthcare setting    Answer:   No    Order Specific Question:   Pregnant    Answer:   No     Meds ordered this encounter  Medications  . modafinil (PROVIGIL) 200 MG tablet    Sig: Take 1  tablet (200 mg total) by mouth daily.    Dispense:  30 tablet    Refill:  5    Order Specific Question:   Supervising Provider    Answer:   Anson Fret [id number]      I spent 30 minutes of face-to-face and non-face-to-face time with patient.  This included previsit chart review, lab review, study review, order entry, electronic health record documentation, patient education.    Shawnie Dapper, MSN, FNP-C 11/26/2020, 12:41 PM  [facility] [address], [address] [place], [place] [address] [phone]

## 2020-11-26 NOTE — Patient Instructions (Signed)
Below is our plan:  We will continue current treatment plan. We will continue to monitor symptoms closely.   Please make sure you are staying well hydrated. I recommend 50-60 ounces daily. Well balanced diet and regular exercise encouraged. Consistent sleep schedule with 6-8 hours recommended.   Please continue follow up with care team as directed.   Follow up with Dr Epimenio Foot in 6 months   You may receive a survey regarding today's visit. I encourage you to leave honest feed back as I do use this information to improve patient care. Thank you for seeing me today!      Multiple Sclerosis Multiple sclerosis (MS) is a disease of the brain, spinal cord, and optic nerves (central nervous system). It causes the body's disease-fighting (immune) system to destroy the protective covering (myelin sheath) around nerves in the brain. When this happens, signals (nerve impulses) going to and from the brain and spinal cord do not get sent properly or may not get sent at all. There are several types of MS:  Relapsing-remitting MS. This is the most common type. This causes sudden attacks of symptoms. After an attack, you may recover completely until the next attack, or some symptoms may remain permanently.  Secondary progressive MS. This usually develops after the onset of relapsing-remitting MS. Similar to relapsing-remitting MS, this type also causes sudden attacks of symptoms. Attacks may be less frequent, but symptoms slowly get worse (progress) over time.  Primary progressive MS. This causes symptoms that steadily progress over time. This type of MS does not cause sudden attacks of symptoms. The age of onset of MS varies, but it often develops between 74-13 years of age. MS is a lifelong (chronic) condition. There is no cure, but treatment can help slow down the progression of the disease. What are the causes? The cause of this condition is not known. What increases the risk? You are more likely to  develop this condition if:  You are a woman.  You have a relative with MS. However, the condition is not passed from parent to child (inherited).  You have a lack (deficiency) of vitamin D.  You smoke. MS is more common in the [place] than in the [place]. What are the signs or symptoms? Relapsing-remitting and secondary progressive MS cause symptoms to occur in episodes or attacks that may last weeks to months. There may be long periods between attacks in which there are almost no symptoms. Primary progressive MS causes symptoms to steadily progress after they develop. Symptoms of MS vary because of the many different ways it affects the central nervous system. The main symptoms include:  Vision problems and eye pain.  Numbness and weakness.  Inability to move your arms, hands, feet, or legs (paralysis).  Balance problems.  Shaking that you cannot control (tremors).  Muscle spasms.  Problems with thinking (cognitive changes). MS can also cause symptoms that are associated with the disease, but are not always the direct result of an MS attack. They may include:  Inability to control urination or bowel movements (incontinence).  Headaches.  Fatigue.  Inability to tolerate heat.  Emotional changes.  Depression.  Pain. How is this diagnosed? This condition is diagnosed based on:  Your symptoms.  A neurological exam. This involves checking central nervous system function, such as nerve function, reflexes, and coordination.  MRIs of the brain and spinal cord.  Lab tests, including a lumbar puncture that tests the fluid that surrounds the brain and spinal  cord (cerebrospinal fluid).  Tests to measure the electrical activity of the brain in response to stimulation (evoked potentials). How is this treated? There is no cure for MS, but medicines can help decrease the number and frequency of attacks and help relieve nuisance symptoms.  Treatment options may include:  Medicines that reduce the frequency of attacks. These medicines may be given by injection, by mouth (orally), or through an IV.  Medicines that reduce inflammation (steroids). These may provide short-term relief of symptoms.  Medicines to help control pain, depression, fatigue, or incontinence.  Nutritional counseling. Vitamin D supplements, if you have a deficiency.  Using devices to help you move around (assistive devices), such as braces, a cane, or a walker.  Physical therapy to strengthen and stretch your muscles.  Occupational therapy to help you with everyday tasks.  Alternative or complementary treatments such as exercise, massage, or acupuncture.   Follow these instructions at home:  Take over-the-counter and prescription medicines only as told by your health care provider.  Do not drive or use heavy machinery while taking prescription pain medicine.  Use assistive devices as recommended by your physical therapist or your health care provider.  Exercise as directed by your health care provider.  Eating healthy can help manage MS symptoms.  Return to your normal activities as told by your health care provider. Ask your health care provider what activities are safe for you.  Reach out for support. Share your feelings with friends, family, or a support group.  Keep all follow-up visits as told by your health care provider and therapists. This is important. Where to find more information  National Multiple Sclerosis Society: https://www.nationalmssociety.[place] [facility] Stroke: http://hendricks-barton.net/  [employer]: GourmetRating.dk Contact a health care provider if:  You feel depressed.  You develop new pain or numbness.  You have tremors.  You have problems with sexual function. Get help right away if:  You develop paralysis.  You  develop numbness.  You have problems with your bladder or bowel function.  You develop double vision.  You lose vision in one or both eyes.  You develop suicidal thoughts.  You develop severe confusion. If you ever feel like you may hurt yourself or others, or have thoughts about taking your own life, get help right away. You can go to your nearest emergency department or call:  Your local emergency services (911 in the [place]  A suicide crisis helpline, such as the [place] at 530-319-0784. This is open 24 hours a day. Summary  Multiple sclerosis (MS) is a disease of the central nervous system that causes the body's immune system to destroy the protective covering (myelin sheath) around nerves in the brain.  There are 3 types of MS: relapsing-remitting, secondary progressive, and primary progressive. Relapsing-remitting and secondary progressive MS cause symptoms to occur in episodes or attacks that may last weeks to months. Primary progressive MS causes symptoms to steadily progress after they develop.  There is no cure for MS, but medicines can help decrease the number and frequency of attacks and help relieve nuisance symptoms. Treatment may also include physical or occupational therapy.  If you develop numbness, paralysis, vision problems, or other neurological symptoms, get help right away. This information is not intended to replace advice given to you by your health care provider. Make sure you discuss any questions you have with your health care provider. Document Revised: 05/07/2020 Document Reviewed: 05/07/2020 Elsevier  Elsevier Patient Education  2021 Elsevier Inc.   

## 2020-11-27 DIAGNOSIS — Z0289 Encounter for other administrative examinations: Secondary | ICD-10-CM

## 2020-11-27 LAB — CBC WITH DIFFERENTIAL/PLATELET
Basophils Absolute: 0 10*3/uL (ref 0.0–0.2)
Basos: 0 %
EOS (ABSOLUTE): 0 10*3/uL (ref 0.0–0.4)
Eos: 0 %
Hematocrit: 39.9 % (ref 34.0–46.6)
Hemoglobin: 12.4 g/dL (ref 11.1–15.9)
Immature Grans (Abs): 0 10*3/uL (ref 0.0–0.1)
Immature Granulocytes: 0 %
Lymphocytes Absolute: 1 10*3/uL (ref 0.7–3.1)
Lymphs: 19 %
MCH: 26.3 pg — ABNORMAL LOW (ref 26.6–33.0)
MCHC: 31.1 g/dL — ABNORMAL LOW (ref 31.5–35.7)
MCV: 85 fL (ref 79–97)
Monocytes Absolute: 0.6 10*3/uL (ref 0.1–0.9)
Monocytes: 10 %
Neutrophils Absolute: 3.8 10*3/uL (ref 1.4–7.0)
Neutrophils: 71 %
Platelets: 290 10*3/uL (ref 150–450)
RBC: 4.71 x10E6/uL (ref 3.77–5.28)
RDW: 13.6 % (ref 11.7–15.4)
WBC: 5.4 10*3/uL (ref 3.4–10.8)

## 2020-11-27 LAB — IGG, IGA, IGM
IgA/Immunoglobulin A, Serum: 167 mg/dL (ref 87–352)
IgG (Immunoglobin G), Serum: 681 mg/dL (ref 586–1602)
IgM (Immunoglobulin M), Srm: 102 mg/dL (ref 26–217)

## 2020-11-27 LAB — SAR COV2 SEROLOGY (COVID19)AB(IGG),IA
SARS-CoV-2 Semi-Quant IgG Ab: <13 AU/mL (ref <13.0)
SARS-CoV-2 Spike Ab Interp: NEGATIVE

## 2020-11-27 LAB — VITAMIN D 25 HYDROXY (VIT D DEFICIENCY, FRACTURES): Vit D, 25-Hydroxy: 21.2 ng/mL — ABNORMAL LOW (ref 30.0–100.0)

## 2020-11-28 ENCOUNTER — Telehealth: Payer: Self-pay | Admitting: *Deleted

## 2020-11-28 NOTE — Telephone Encounter (Signed)
Signed and returned

## 2020-11-28 NOTE — Telephone Encounter (Signed)
Received FMLA form from [employer], filled out and placed on NP's desk for review, signature.

## 2020-12-05 DIAGNOSIS — J0110 Acute frontal sinusitis, unspecified: Secondary | ICD-10-CM

## 2020-12-05 MED ORDER — AMOXICILLIN-POT CLAVULANATE 875-125 MG PO TABS: 1.0000 | ORAL_TABLET | Freq: Two times a day (BID) | ORAL | 0 refills | Status: DC

## 2020-12-06 ENCOUNTER — Encounter: Payer: Self-pay | Admitting: Family Medicine

## 2021-02-18 ENCOUNTER — Telehealth: Payer: Self-pay | Admitting: Neurology

## 2021-02-18 NOTE — Telephone Encounter (Signed)
PA submitted through [facility] KEY: [id number] Will await response

## 2021-02-19 NOTE — Telephone Encounter (Signed)
Medication approved. Request Reference Number: [id number]. MODAFINIL TAB 200MG  is approved through 08/21/2021. Your patient may now fill this prescription and it will be covered.

## 2021-02-20 ENCOUNTER — Other Ambulatory Visit: Payer: Self-pay | Admitting: Family Medicine

## 2021-02-20 DIAGNOSIS — J0110 Acute frontal sinusitis, unspecified: Secondary | ICD-10-CM

## 2021-03-17 ENCOUNTER — Other Ambulatory Visit: Payer: Self-pay | Admitting: *Deleted

## 2021-03-17 MED ORDER — TRAMADOL HCL 50 MG PO TABS: ORAL_TABLET | ORAL | 0 refills | Status: DC

## 2021-05-12 ENCOUNTER — Ambulatory Visit: Payer: 59 | Admitting: Neurology

## 2021-06-03 ENCOUNTER — Ambulatory Visit: Payer: Self-pay | Admitting: *Deleted

## 2021-06-03 ENCOUNTER — Ambulatory Visit (INDEPENDENT_AMBULATORY_CARE_PROVIDER_SITE_OTHER): Payer: 59 | Admitting: Internal Medicine

## 2021-06-03 ENCOUNTER — Encounter: Payer: Self-pay | Admitting: Internal Medicine

## 2021-06-03 VITALS — BP 139/61 | HR 64 | Temp 97.5°F | Resp 17 | Wt 327.0 lb

## 2021-06-03 DIAGNOSIS — R531 Weakness: Secondary | ICD-10-CM | POA: Diagnosis not present

## 2021-06-03 DIAGNOSIS — R42 Dizziness and giddiness: Secondary | ICD-10-CM | POA: Diagnosis not present

## 2021-06-03 DIAGNOSIS — E559 Vitamin D deficiency, unspecified: Secondary | ICD-10-CM

## 2021-06-03 DIAGNOSIS — G35D Multiple sclerosis, unspecified: Secondary | ICD-10-CM

## 2021-06-03 DIAGNOSIS — R448 Other symptoms and signs involving general sensations and perceptions: Secondary | ICD-10-CM

## 2021-06-03 DIAGNOSIS — E538 Deficiency of other specified B group vitamins: Secondary | ICD-10-CM

## 2021-06-03 DIAGNOSIS — D508 Other iron deficiency anemias: Secondary | ICD-10-CM

## 2021-06-03 DIAGNOSIS — R5383 Other fatigue: Secondary | ICD-10-CM

## 2021-06-03 DIAGNOSIS — G35 Multiple sclerosis: Secondary | ICD-10-CM

## 2021-06-03 DIAGNOSIS — R0989 Other specified symptoms and signs involving the circulatory and respiratory systems: Secondary | ICD-10-CM

## 2021-06-03 LAB — POCT INFLUENZA A/B
Influenza A, POC: NEGATIVE
Influenza B, POC: NEGATIVE

## 2021-06-03 NOTE — Telephone Encounter (Signed)
Reason for Disposition  [1] MILD dizziness (e.g., walking normally) AND [2] has NOT been evaluated by physician for this  (Exception: dizziness caused by heat exposure, sudden standing, or poor fluid intake)  Answer Assessment - Initial Assessment Questions 1. DESCRIPTION: "Describe your dizziness."     More lightheadedness  2. LIGHTHEADED: "Do you feel lightheaded?" (e.g., somewhat faint, woozy, weak upon standing)     Head feels lightheaded 3. VERTIGO: "Do you feel like either you or the room is spinning or tilting?" (i.e. vertigo)     no 4. SEVERITY: "How bad is it?"  "Do you feel like you are going to faint?" "Can you stand and walk?"   - MILD: Feels slightly dizzy, but walking normally.   - MODERATE: Feels unsteady when walking, but not falling; interferes with normal activities (e.g., school, work).   - SEVERE: Unable to walk without falling, or requires assistance to walk without falling; feels like passing out now.      mild 5. ONSET:  "When did the dizziness begin?"     Last Thursday 6. AGGRAVATING FACTORS: "Does anything make it worse?" (e.g., standing, change in head position)     no 7. HEART RATE: "Can you tell me your heart rate?" "How many beats in 15 seconds?"  (Note: not all patients can do this)       no 8. CAUSE: "What do you think is causing the dizziness?"     Seasonal allergies  9. RECURRENT SYMPTOM: "Have you had dizziness before?" If Yes, ask: "When was the last time?" "What happened that time?"     Yes- but usually goes away 10. OTHER SYMPTOMS: "Do you have any other symptoms?" (e.g., fever, chest pain, vomiting, diarrhea, bleeding)       Pain URQ- under arm 11. PREGNANCY: "Is there any chance you are pregnant?" "When was your last menstrual period?"       na  Protocols used: Dizziness - Lightheadedness-A-AH

## 2021-06-03 NOTE — Patient Instructions (Signed)
Weakness Weakness is a lack of strength. You may feel weak all over your body (generalized), or you may feel weak in one part of your body (focal). There are many potential causes of weakness. Sometimes, the cause of your weakness may not be known. Some causes of weakness can be serious, so it isimportant to see your doctor. Follow these instructions at home: Activity Rest as needed. Try to get enough sleep. Most adults need 7-8 hours of sleep each night. Talk to your doctor about how much sleep you need each night. Do exercises, such as arm curls and leg raises, for 30 minutes at least 2 days a week or as told by your doctor. Think about working with a physical therapist or trainer to help you get stronger. General instructions  Take over-the-counter and prescription medicines only as told by your doctor. Eat a healthy, well-balanced diet. This includes: Proteins to build muscles, such as lean meats and fish. Fresh fruits and vegetables. Carbohydrates to boost energy, such as whole grains. Drink enough fluid to keep your pee (urine) pale yellow. Keep all follow-up visits as told by your doctor. This is important.  Contact a doctor if: Your weakness does not get better or it gets worse. Your weakness affects your ability to: Think clearly. Do your normal daily activities. Get help right away if you: Have sudden weakness on one side of your face or body. Have chest pain. Have trouble breathing or shortness of breath. Have problems with your vision. Have trouble talking or swallowing. Have trouble standing or walking. Are light-headed. Pass out (lose consciousness). Summary Weakness is a lack of strength. You may feel weak all over your body or just in one part of your body. There are many potential causes of weakness. Sometimes, the cause of your weakness may not be known. Rest as needed, and try to get enough sleep. Most adults need 7-8 hours of sleep each night. Eat a healthy,  well-balanced diet. This information is not intended to replace advice given to you by your health care provider. Make sure you discuss any questions you have with your healthcare provider. Document Revised: 03/02/2018 Document Reviewed: 03/02/2018 Elsevier Patient Education  2022 Elsevier Inc.  

## 2021-06-03 NOTE — Assessment & Plan Note (Signed)
Possible flare? Blood work pending

## 2021-06-03 NOTE — Progress Notes (Signed)
Subjective:    Patient ID: Jody Lee, female    DOB: 1971/04/09, 50 y.o.   MRN: [id number]  HPI  Pt presents to the clinic today with c/o dizziness, fatigue and weakness. This started 5 days ago. She has a facial pressure and a slight runny nose but denies headache, nasal congestion, ear pain, sore throat or cough. She did feel slightly short of breath walking into the clinic today and had some chest tightness  but denies chest pain. She has had some nausea but denies vomiting or diarrhea. She felt like she had a fever or Friday but denies chills or body aches. She has tried Tylenol Cold and Flu, allergy sinus medication with minimal relief of symptoms. She has had a negative home covid test. She has a history of MS but reports she has not had a flare since 2018.   Review of Systems     Past Medical History:  Diagnosis Date   Anemia    Hypertension    Menorrhagia    MS (multiple sclerosis) (HCC)    Shingles    right side of chest and back   Vision abnormalities    Vitamin B 12 deficiency    Vitamin D deficiency     Current Outpatient Medications  Medication Sig Dispense Refill   acetaminophen (TYLENOL) 325 MG tablet Take 650 mg by mouth every 4 (four) hours as needed.     amoxicillin-clavulanate (AUGMENTIN) 875-125 MG tablet Take 1 tablet by mouth 2 (two) times daily. 20 tablet 0   baclofen (LIORESAL) 10 MG tablet TAKE 1 TABLET(10 MG) BY MOUTH THREE TIMES DAILY 270 tablet 3   fluticasone (FLONASE) 50 MCG/ACT nasal spray PLACE 2 SPRAYS INTO BOTH NOSTRILS EVERY DAY; USE FOR 4- 6 WEEKS THEN STOP AND USE SEASONALLY OR AS NEEDED 48 g 0   gabapentin (NEURONTIN) 100 MG capsule TAKE 1 CAPSULE(100 MG) BY MOUTH THREE TIMES DAILY 90 capsule 2   modafinil (PROVIGIL) 200 MG tablet Take 1 tablet (200 mg total) by mouth daily. 30 tablet 5   ocrelizumab 600 mg in sodium chloride 0.9 % 500 mL Inject 600 mg into the vein once.     traMADol (ULTRAM) 50 MG tablet 1 pill po bid prn 60 tablet 0    Vitamin D, Ergocalciferol, (DRISDOL) 1.25 MG (50000 UNIT) CAPS capsule TAKE 1 CAPSULE BY MOUTH EVERY 7 DAYS 13 capsule 3   No current facility-administered medications for this visit.   Facility-Administered Medications Ordered in Other Visits  Medication Dose Route Frequency Provider Last Rate Last Admin   gadopentetate dimeglumine (MAGNEVIST) injection 20 mL  20 mL Intravenous Once PRN Sater, Pearletha Furl, MD        Allergies  Allergen Reactions   Nsaids Other (See Comments)    Not supposed to take d/t bariatric surgery    Phentermine     Made her heart race   Ciprofloxacin Rash    Felt like heat/burning rash    Family History  Problem Relation Age of Onset   Stroke Mother    Heart disease Mother    Asthma Mother    Diabetes Mother    Hypertension Father    Heart disease Father    Diabetes Father    Asthma Brother    Alcoholism Brother    Obesity Brother    Heart disease Brother    Diabetes Maternal Grandmother    Lung cancer Maternal Grandfather    Breast cancer Neg Hx     Social  History   Socioeconomic History   Marital status: Married    Spouse name: Not on file   Number of children: 1   Years of education: Not on file   Highest education level: Not on file  Occupational History   Occupation: Programmer  Tobacco Use   Smoking status: Never   Smokeless tobacco: Never  Vaping Use   Vaping Use: Never used  Substance and Sexual Activity   Alcohol use: No   Drug use: No   Sexual activity: Yes    Birth control/protection: Injection  Other Topics Concern   Not on file  Social History Narrative   Remarried in 2018. Has one daughter, one grandchild and 2 step children. Works from home   Social Determinants of Corporate investment banker Strain: Not on [employer] Insecurity: Not on file  Transportation Needs: Not on file  Physical Activity: Not on file  Stress: Not on file  Social Connections: Not on file  Intimate Partner Violence: Not on file      Constitutional: Pt reports fatigue. Denies fever, malaise, headache or abrupt weight changes.  HEENT: Pt reports facial pressure, runny nose. Denies eye pain, eye redness, ear pain, ringing in the ears, wax buildup, nasal congestion, bloody nose, or sore throat. Respiratory: Pt reports shortness of breath. Denies difficulty breathing, cough or sputum production.   Cardiovascular: Pt reports chest tightness. Denies chest pain, palpitations or swelling in the hands or feet.  Gastrointestinal: Pt reports nausea. Denies abdominal pain, bloating, constipation, diarrhea or blood in the stool.  GU: Denies urgency, frequency, pain with urination, burning sensation, blood in urine, odor or discharge. Musculoskeletal: Pt reports generalized weakness. Denies decrease in range of motion, difficulty with gait, muscle pain or joint pain and swelling.  Skin: Denies redness, rashes, lesions or ulcercations.  Neurological: Pt reports dizziness. Denies difficulty with memory, difficulty with speech or problems with balance and coordination.   No other specific complaints in a complete review of systems (except as listed in HPI above).  Objective:   Physical Exam   BP 139/61 (BP Location: Right Arm, Patient Position: Sitting, Cuff Size: Large)   Pulse 64   Temp (!) 97.5 F (36.4 C) (Temporal)   Resp 17   SpO2 100%    Wt Readings from Last 3 Encounters:  11/26/20 (!) 318 lb (144.2 kg)  11/13/20 (!) 310 lb (140.6 kg)  05/20/20 (!) 314 lb 8 oz (142.7 kg)    General: Appears her stated age, obese, in NAD. Skin: Warm, dry and intact. No rashes noted. HEENT: Head: normal shape and size, no sinus tenderness noted; Eyes: sclera white and EOMs intact;  Neck:  No adenopathy noted Cardiovascular: Normal rate and rhythm. S1,S2 noted.  No murmur, rubs or gallops noted.  Pulmonary/Chest: Normal effort and positive vesicular breath sounds. No respiratory distress. No wheezes, rales or ronchi noted.   Musculoskeletal: No difficulty with gait.  Neurological: Alert and oriented.    BMET    Component Value Date/Time   NA 140 01/17/2018 1453   NA 144 06/24/2017 1439   NA 139 06/27/2014 1356   K 4.8 01/17/2018 1453   K 3.9 06/27/2014 1356   CL 110 01/17/2018 1453   CL 106 06/27/2014 1356   CO2 23 01/17/2018 1453   CO2 28 06/27/2014 1356   GLUCOSE 107 (H) 01/17/2018 1453   GLUCOSE 89 06/27/2014 1356   BUN 25 (H) 01/17/2018 1453   BUN 12 06/24/2017 1439   BUN 7  06/27/2014 1356   CREATININE 1.00 05/15/2019 1459   CREATININE 0.90 10/15/2017 1034   CALCIUM 9.2 01/17/2018 1453   CALCIUM 8.3 (L) 06/27/2014 1356   GFRNONAA 43 (L) 01/17/2018 1453   GFRNONAA 77 10/15/2017 1034   GFRAA 50 (L) 01/17/2018 1453   GFRAA 89 10/15/2017 1034    Lipid Panel  No results found for: CHOL, TRIG, HDL, CHOLHDL, VLDL, LDLCALC  CBC    Component Value Date/Time   WBC 5.4 11/26/2020 1143   WBC 5.9 02/16/2018 1131   RBC 4.71 11/26/2020 1143   RBC 3.73 (L) 02/16/2018 1131   HGB 12.4 11/26/2020 1143   HCT 39.9 11/26/2020 1143   PLT 290 11/26/2020 1143   MCV 85 11/26/2020 1143   MCV 83 09/07/2014 0810   MCH 26.3 (L) 11/26/2020 1143   MCH 30.3 02/16/2018 1131   MCHC 31.1 (L) 11/26/2020 1143   MCHC 33.5 02/16/2018 1131   RDW 13.6 11/26/2020 1143   RDW 23.5 (H) 09/07/2014 0810   LYMPHSABS 1.0 11/26/2020 1143   LYMPHSABS 0.2 (L) 09/07/2014 0810   MONOABS 0.6 02/16/2018 1131   MONOABS 0.4 09/07/2014 0810   EOSABS 0.0 11/26/2020 1143   EOSABS 0.0 09/07/2014 0810   BASOSABS 0.0 11/26/2020 1143   BASOSABS 0.0 09/07/2014 0810    Hgb A1C Lab Results  Component Value Date   HGBA1C 5.3 10/15/2017           Assessment & Plan:   Fatigue, Sinus Pressure, Runny Nose, Shortness of Breath, Chest Tightness, Nausea, Generalized Weakness and Dizziness:  Home covid test negative Rapid flu negative Will check CBC with Diff, iron panel (hx of iron def anemia, B12 and Vit D (hx of b12 and vit d  def) and TSH Could possibly be a MS flare No indication that she has a bacterial sinusitis Could be a viral sinusitis, could try Pred Taper pending labs Encouraged rest and fluids  Will follow up after labs with further recommendation and treatment plan   Nicki Reaper, NP This visit occurred during the SARS-CoV-2 public health emergency.  Safety protocols were in place, including screening questions prior to the visit, additional usage of staff PPE, and extensive cleaning of exam room while observing appropriate contact time as indicated for disinfecting solutions.

## 2021-06-04 ENCOUNTER — Encounter: Payer: Self-pay | Admitting: Internal Medicine

## 2021-06-04 LAB — CBC WITH DIFFERENTIAL/PLATELET
Absolute Monocytes: 554 cells/uL (ref 200–950)
Basophils Absolute: 20 cells/uL (ref 0–200)
Basophils Relative: 0.4 %
Eosinophils Absolute: 0 cells/uL — ABNORMAL LOW (ref 15–500)
Eosinophils Relative: 0 %
HCT: 35.5 % (ref 35.0–45.0)
Hemoglobin: 11.3 g/dL — ABNORMAL LOW (ref 11.7–15.5)
Lymphs Abs: 1009 cells/uL (ref 850–3900)
MCH: 26.8 pg — ABNORMAL LOW (ref 27.0–33.0)
MCHC: 31.8 g/dL — ABNORMAL LOW (ref 32.0–36.0)
MCV: 84.3 fL (ref 80.0–100.0)
MPV: 11.1 fL (ref 7.5–12.5)
Monocytes Relative: 11.3 %
Neutro Abs: 3317 cells/uL (ref 1500–7800)
Neutrophils Relative %: 67.7 %
Platelets: 262 10*3/uL (ref 140–400)
RBC: 4.21 10*6/uL (ref 3.80–5.10)
RDW: 14.1 % (ref 11.0–15.0)
Total Lymphocyte: 20.6 %
WBC: 4.9 10*3/uL (ref 3.8–10.8)

## 2021-06-04 LAB — VITAMIN B12: Vitamin B-12: 481 pg/mL (ref 200–1100)

## 2021-06-04 LAB — TSH: TSH: 2.03 mIU/L

## 2021-06-04 LAB — IRON,TIBC AND FERRITIN PANEL
%SAT: 13 % (calc) — ABNORMAL LOW (ref 16–45)
Ferritin: 11 ng/mL — ABNORMAL LOW (ref 16–232)
Iron: 50 ug/dL (ref 40–190)
TIBC: 387 mcg/dL (calc) (ref 250–450)

## 2021-06-04 LAB — VITAMIN D 25 HYDROXY (VIT D DEFICIENCY, FRACTURES): Vit D, 25-Hydroxy: 18 ng/mL — ABNORMAL LOW (ref 30–100)

## 2021-06-04 MED ORDER — VITAMIN D (ERGOCALCIFEROL) 1.25 MG (50000 UNIT) PO CAPS: 50000.0000 [IU] | ORAL_CAPSULE | ORAL | 0 refills | Status: DC

## 2021-06-04 NOTE — Addendum Note (Signed)
Addended by: Lorre Munroe on: 06/04/2021 06:37 AM   Modules accepted: Orders

## 2021-07-02 ENCOUNTER — Ambulatory Visit (INDEPENDENT_AMBULATORY_CARE_PROVIDER_SITE_OTHER): Payer: 59 | Admitting: Neurology

## 2021-07-02 ENCOUNTER — Telehealth: Payer: Self-pay | Admitting: Neurology

## 2021-07-02 ENCOUNTER — Ambulatory Visit: Payer: 59 | Admitting: Neurology

## 2021-07-02 ENCOUNTER — Encounter: Payer: Self-pay | Admitting: Neurology

## 2021-07-02 VITALS — BP 137/70 | HR 64 | Ht 64.0 in | Wt 322.8 lb

## 2021-07-02 DIAGNOSIS — R5383 Other fatigue: Secondary | ICD-10-CM

## 2021-07-02 DIAGNOSIS — E559 Vitamin D deficiency, unspecified: Secondary | ICD-10-CM

## 2021-07-02 DIAGNOSIS — G35 Multiple sclerosis: Secondary | ICD-10-CM | POA: Diagnosis not present

## 2021-07-02 DIAGNOSIS — R269 Unspecified abnormalities of gait and mobility: Secondary | ICD-10-CM

## 2021-07-02 DIAGNOSIS — Z6841 Body Mass Index (BMI) 40.0 and over, adult: Secondary | ICD-10-CM

## 2021-07-02 DIAGNOSIS — Z79899 Other long term (current) drug therapy: Secondary | ICD-10-CM

## 2021-07-02 MED ORDER — MODAFINIL 200 MG PO TABS: 200.0000 mg | ORAL_TABLET | Freq: Every day | ORAL | 1 refills | Status: DC

## 2021-07-02 MED ORDER — BACLOFEN 10 MG PO TABS: ORAL_TABLET | ORAL | 3 refills | Status: DC

## 2021-07-02 NOTE — Telephone Encounter (Signed)
FYI: Pt had appt today with Dr. Epimenio Foot and prepaid for her FMLA forms to be filled out that will be faxed to [place] from [place].

## 2021-07-02 NOTE — Progress Notes (Signed)
[facility]  PATIENT: Jody Lee DOB: [birth year]  REFERRING DOCTOR OR PCP:  Venora Maples SOURCE: patient and labs, notes in EMR and MRI on PACS ________________________________   HISTORICAL  CHIEF COMPLAINT:  Chief Complaint  Patient presents with   Follow-up    Rm 2, alone. Here for 6 month MS f/u, on Ocrevus. Last infusion date: 05/05/2021 Next infusion date: 11/17/2021 done at [facility]. Pt reports being stable. No new or worsening in sx. Vitamin d was low and taking weekly supplement.  Needs refills today.     HISTORY OF PRESENT ILLNESS:  Jody Lee is a 50 y.o. woman with relapsing remitting MS diagnosed in 2005.     Update 07/02/2021: She is on Ocrevus and had her last infusion September, 2022.    IgG/IgM was normal 11/26/2020.  Vit D was 21.2 so she is back on supplements.  .      Her gait is stable with a mild right foot drop when she walks longer distance.    She has dysesthetic pain, especially in the mid back with a squeezing sensation.   She feels she did worse the last 2 weeks of her last infusion cycle..   She takes tramadol when more painful.   Gabapentin and baclofen also help.     She has pain in the left hand and making a fist is harder.   She wears a brace.  Spasms are more panful at night in her legs.     She has bowel urgency but no incontinence.   No UTI.  Vision is stable.   She sees ophthalmology next week.     She had gained weight (+ 8 pounds since last visit, but greater than 80 pounds since her surgery).      Phentermine was not well tolerated (palpitations).   She had bariatric surgery (Roex en Y) in the past and had stable weight for a few years.   Unfortunately over the last 2 years she gained much of it back.  I did discuss medical weight loss referral with her and she will consider this.  She has fatigue, both physical and cognitive.  Marland Kitchen   She works (from home).   She sleeps well most nights but is sleeping less due to the hours.   She is  tired during the day and takes naps .   Provigil helps sleepiness.      PSG in 11/2018 did not show OSA (AHI = 3.3)   She has cognitive issues with reduced focus/attention at times.      EPWORTH SLEEPINESS SCALE  On a scale of 0 - 3 what is the chance of dozing:  Sitting and Reading:  1    Watching TV:   2 Sitting inactive in a public place: 0 Passenger in car for one hour: 0 Lying down to rest in the afternoon: 2 Sitting and talking to someone: 0 Sitting quietly after lunch:  0 In a car, stopped in traffic:  0  Total (out of 24):   5/24   Not sleepy.     MS History:   She was diagnosed with MS in 2005 after presenting with a spinal cord syndrome. She started with numbness and pain in the feet that worked its way up to her abdomen over a couple days.   Initially, she had an MRI of the spine that showed a plaque consistent with her symptoms. She was referred to Dr. Maple Hudson. He did a lumbar puncture and  ordered an MRI of the brain. Both were consistent with multiple sclerosis and I started to see her. Initially, she was placed on Betaseron and did very well for the first few years. Then around 2011 or 2012, she had an exacerbation and testing showed that she had developed antibodies against Betaseron. Therefore, she was switched to Gilenya at that time. She has been on Gilenya for the last 4+ years and has done well. Specifically, she has not reported any difficulties with exacerbations and she tolerates the medication well.   Her last MRI was performed at [facility] November 2015.  MRI of the brain from 03/02/2013 showed  typical periventricular T2/FLAIR hyperintense foci, predominantly in the periventricular white matter.   There were no acute foci.  MRI in 06/15/2017 showed a new enahncing lesion so she switched to Ocrevus  Late 2018.   MRI of the brain 05/15/2019 showed no new lesions.  MRI of the brain 04/17/2018 showed no new lesions.  Enhancement previously seen on the  06/15/2017 MRI had resolved.  MRI of the brain 06/15/2017 showed 1 enhancing lesion in the right periatrial white matter not present on the 2017 MRI.  The MRI was otherwise unchanged.  MRI of the cervical spine 06/17/2017 showed about 5 or 6 T2 hyperintense foci within the spinal cord located at C2, C2-C3, C4, C5, C6-C7 and T2-T3.  None of the foci were enhanced.  MRI was felt to be technically better than the 2017 MRI and some interval change could not be ruled out.  Other: We did a PSG 11/2018 but she had only negligible OSA with an AHI = 3.3.   An optional oral appliance was discussed for her snoring.    REVIEW OF SYSTEMS: Constitutional: No fevers, chills, sweats, or change in appetite.   She has fatigue. Eyes: No visual changes, double vision, eye pain Ear, nose and throat: No hearing loss, ear pain, nasal congestion, sore throat Cardiovascular: No chest pain, palpitations Respiratory:  No shortness of breath at rest or with exertion.   No wheezes GastrointestinaI: No nausea, vomiting, diarrhea, abdominal pain, fecal incontinence Genitourinary:  No dysuria, urinary retention or frequency.  No nocturia. Musculoskeletal:  No neck pain, back pain Integumentary: No rash, pruritus, skin lesions Neurological: as above Psychiatric: There is depression and anxiety.  She is tearful at times. Endocrine: No palpitations, diaphoresis, change in appetite, change in weigh or increased thirst Hematologic/Lymphatic:  No anemia, purpura, petechiae. Allergic/Immunologic: No itchy/runny eyes, nasal congestion, recent allergic reactions, rashes  ALLERGIES: Allergies  Allergen Reactions   Nsaids Other (See Comments)    Not supposed to take d/t bariatric surgery    Phentermine     Made her heart race   Ciprofloxacin Rash    Felt like heat/burning rash    HOME MEDICATIONS:  Current Outpatient Medications:    acetaminophen (TYLENOL) 325 MG tablet, Take 650 mg by mouth every 4 (four) hours as needed.,  Disp: , Rfl:    gabapentin (NEURONTIN) 100 MG capsule, TAKE 1 CAPSULE(100 MG) BY MOUTH THREE TIMES DAILY, Disp: 90 capsule, Rfl: 2   ocrelizumab 600 mg in sodium chloride 0.9 % 500 mL, Inject 600 mg into the vein once., Disp: , Rfl:    traMADol (ULTRAM) 50 MG tablet, 1 pill po bid prn, Disp: 60 tablet, Rfl: 0   Vitamin D, Ergocalciferol, (DRISDOL) 1.25 MG (50000 UNIT) CAPS capsule, Take 1 capsule (50,000 Units total) by mouth once a week. For 12 weeks. Then start OTC Vitamin  D3 2,000 unit daily., Disp: 12 capsule, Rfl: 0   baclofen (LIORESAL) 10 MG tablet, TAKE 1 TABLET(10 MG) BY MOUTH THREE TIMES DAILY, Disp: 270 tablet, Rfl: 3   modafinil (PROVIGIL) 200 MG tablet, Take 1 tablet (200 mg total) by mouth daily., Disp: 90 tablet, Rfl: 1 No current facility-administered medications for this visit.  Facility-Administered Medications Ordered in Other Visits:    gadopentetate dimeglumine (MAGNEVIST) injection 20 mL, 20 mL, Intravenous, Once PRN, Dayanna Pryce, Pearletha Furl, MD  PAST MEDICAL HISTORY: Past Medical History:  Diagnosis Date   Anemia    Hypertension    Menorrhagia    MS (multiple sclerosis) (HCC)    Shingles    right side of chest and back   Vision abnormalities    Vitamin B 12 deficiency    Vitamin D deficiency     PAST SURGICAL HISTORY: Past Surgical History:  Procedure Laterality Date   CHOLECYSTECTOMY     ESOPHAGOGASTRODUODENOSCOPY (EGD) WITH PROPOFOL N/A 12/18/2019   Procedure: ESOPHAGOGASTRODUODENOSCOPY (EGD) WITH PROPOFOL;  Surgeon: Pasty Spillers, MD;  Location: [facility] ENDOSCOPY;  Service: Endoscopy;  Laterality: N/A;   GASTRIC BYPASS      FAMILY HISTORY: Family History  Problem Relation Age of Onset   Stroke Mother    Heart disease Mother    Asthma Mother    Diabetes Mother    Hypertension Father    Heart disease Father    Diabetes Father    Asthma Brother    Alcoholism Brother    Obesity Brother    Heart disease Brother    Diabetes Maternal Grandmother     Lung cancer Maternal Grandfather    Breast cancer Neg Hx     SOCIAL HISTORY:  Social History   Socioeconomic History   Marital status: Married    Spouse name: Not on file   Number of children: 1   Years of education: Not on file   Highest education level: Not on file  Occupational History   Occupation: Magazine features editor  Tobacco Use   Smoking status: Never   Smokeless tobacco: Never  Vaping Use   Vaping Use: Never used  Substance and Sexual Activity   Alcohol use: No   Drug use: No   Sexual activity: Yes    Birth control/protection: Injection  Other Topics Concern   Not on file  Social History Narrative   Remarried in 2018. Has one daughter, one grandchild and 2 step children. Works from home   Social Determinants of Corporate investment banker Strain: Not on [employer] Insecurity: Not on file  Transportation Needs: Not on file  Physical Activity: Not on file  Stress: Not on file  Social Connections: Not on file  Intimate Partner Violence: Not on file     PHYSICAL EXAM  Vitals:   07/02/21 1338  BP: 137/70  Pulse: 64  Weight: (!) 322 lb 12.8 oz (146.4 kg)  Height: 5\' 4"  (1.626 m)    Body mass index is 55.41 kg/m.   General: The patient is well-developed and well-nourished and in no acute distress    Neurologic Exam  Mental status: The patient is alert and oriented x 3 at the time of the examination. The patient has apparent normal recent and remote memory, with an apparently normal attention span and concentration ability.   Speech is normal.  Cranial nerves: Extraocular muscles are normal.  Facial strength and sensation is normal.. Hearing is normal and symmetric. Trapezius strength is normal.   Motor:  Muscle  bulk is normal.   Muscle tone is normal.  Strength is 5/5 in the arms and legs.  Sensory: Intact sensation to touch and vibration in the arms and legs.  Coordination: Finger-nose-finger and heel-to-shin is performed well.    Gait and station:  Station is normal.  Her gait is normal.  Tandem gait is wide..  The Romberg was negative.    Reflexes: Deep tendon reflexes are symmetric and normal bilaterally.       Relapsing remitting multiple sclerosis (HCC)  High risk medication use  Vitamin D deficiency  Gait disturbance  Other fatigue  BMI 50.0-59.9, adult (HCC)   1.   Continue Ocrevus every 6 months.  Check lab work. 2.   Continue Provigil for sleepiness/fatigue.  She was unable to tolerate phentermine.  Renew tramadol for pain 3.   Continue other medications.  Baclofen as needed 4.   Stay active and exercise as tolerated.  We discussed weight loss referral or medical options.  She has already had surgery but gained much of the weight back. 5.   Has history of vitamin B12 and D deficiencies but no recent supplements.   She is s/p gasttric bypass 6.   Return in 6 months or sooner if there are new or worsening neurologic symptoms.    [place] Antolin A. Epimenio Foot, MD, PhD 07/03/2021, 8:42 AM Certified in Neurology, Clinical Neurophysiology, Sleep Medicine, Pain Medicine and Neuroimaging  [facility] [address] [place], [place] [address] [phone]

## 2021-07-03 ENCOUNTER — Encounter: Payer: Self-pay | Admitting: Neurology

## 2021-07-07 DIAGNOSIS — Z0289 Encounter for other administrative examinations: Secondary | ICD-10-CM

## 2021-07-22 ENCOUNTER — Telehealth: Payer: Self-pay

## 2021-07-22 NOTE — Telephone Encounter (Signed)
PA for Modafinil has been approved.  PA Request Reference Number: [id number]. MODAFINIL TAB 200MG  is approved through 01/20/2022. Your patient may now fill this prescription and it will be covered.

## 2021-07-22 NOTE — Telephone Encounter (Signed)
PA for Modafinil has been sent via cover my meds.  (Key: [id number])  [employer] is reviewing your PA request. Typically an electronic response will be received within 24-72 hours. To check for an update later, open this request from your dashboard.  You may close this dialog and return to your dashboard to perform other tasks.

## 2021-08-18 ENCOUNTER — Encounter: Payer: Self-pay | Admitting: Internal Medicine

## 2021-08-21 ENCOUNTER — Telehealth: Payer: Self-pay

## 2021-08-21 ENCOUNTER — Encounter: Payer: Self-pay | Admitting: Internal Medicine

## 2021-08-21 ENCOUNTER — Telehealth (INDEPENDENT_AMBULATORY_CARE_PROVIDER_SITE_OTHER): Payer: 59 | Admitting: Internal Medicine

## 2021-08-21 DIAGNOSIS — R509 Fever, unspecified: Secondary | ICD-10-CM

## 2021-08-21 DIAGNOSIS — R0981 Nasal congestion: Secondary | ICD-10-CM | POA: Diagnosis not present

## 2021-08-21 DIAGNOSIS — J029 Acute pharyngitis, unspecified: Secondary | ICD-10-CM

## 2021-08-21 DIAGNOSIS — R051 Acute cough: Secondary | ICD-10-CM

## 2021-08-21 DIAGNOSIS — R52 Pain, unspecified: Secondary | ICD-10-CM

## 2021-08-21 LAB — POCT INFLUENZA A/B
Influenza A, POC: NEGATIVE
Influenza B, POC: NEGATIVE

## 2021-08-21 MED ORDER — PREDNISONE 10 MG PO TABS: ORAL_TABLET | ORAL | 0 refills | Status: DC

## 2021-08-21 NOTE — Progress Notes (Signed)
Virtual Visit via Video Note  I connected with Jody Lee on 08/21/21 at 11:20 AM EST by a video enabled telemedicine application and verified that I am speaking with the correct person using two identifiers.  Location: Patient: Home Provider: Office  Persons participating in this video call: Nicki Reaper, NP and [employer].   I discussed the limitations of evaluation and management by telemedicine and the availability of in person appointments. The patient expressed understanding and agreed to proceed.  History of Present Illness:  Patient reports nasal congestion, post nasal drip, sore throat and cough. She reports this started 1 week ago. She is not blowing much out of her nose. She denies difficulty swallowing. The cough is productive of clear mucous. She denies headache, runny nose, shortness of breath, nausea, vomiting or diarrhea. She has tried Dayquil OTC with minimal relief of symptoms. She has not had sick contacts diagnosed with covid or flu but her husband and daughter are both sick with similar symptoms. She had a negative home covid test 1 week ago. She has had her covid and flu vaccines.   Past Medical History:  Diagnosis Date   Anemia    Hypertension    Menorrhagia    MS (multiple sclerosis) (HCC)    Shingles    right side of chest and back   Vision abnormalities    Vitamin B 12 deficiency    Vitamin D deficiency     Current Outpatient Medications  Medication Sig Dispense Refill   acetaminophen (TYLENOL) 325 MG tablet Take 650 mg by mouth every 4 (four) hours as needed.     baclofen (LIORESAL) 10 MG tablet TAKE 1 TABLET(10 MG) BY MOUTH THREE TIMES DAILY 270 tablet 3   gabapentin (NEURONTIN) 100 MG capsule TAKE 1 CAPSULE(100 MG) BY MOUTH THREE TIMES DAILY 90 capsule 2   modafinil (PROVIGIL) 200 MG tablet Take 1 tablet (200 mg total) by mouth daily. 90 tablet 1   ocrelizumab 600 mg in sodium chloride 0.9 % 500 mL Inject 600 mg into the vein once.     traMADol  (ULTRAM) 50 MG tablet 1 pill po bid prn 60 tablet 0   Vitamin D, Ergocalciferol, (DRISDOL) 1.25 MG (50000 UNIT) CAPS capsule Take 1 capsule (50,000 Units total) by mouth once a week. For 12 weeks. Then start OTC Vitamin D3 2,000 unit daily. 12 capsule 0   No current facility-administered medications for this visit.   Facility-Administered Medications Ordered in Other Visits  Medication Dose Route Frequency Provider Last Rate Last Admin   gadopentetate dimeglumine (MAGNEVIST) injection 20 mL  20 mL Intravenous Once PRN Sater, Pearletha Furl, MD        Allergies  Allergen Reactions   Nsaids Other (See Comments)    Not supposed to take d/t bariatric surgery    Phentermine     Made her heart race   Ciprofloxacin Rash    Felt like heat/burning rash    Family History  Problem Relation Age of Onset   Stroke Mother    Heart disease Mother    Asthma Mother    Diabetes Mother    Hypertension Father    Heart disease Father    Diabetes Father    Asthma Brother    Alcoholism Brother    Obesity Brother    Heart disease Brother    Diabetes Maternal Grandmother    Lung cancer Maternal Grandfather    Breast cancer Neg Hx     Social History   Socioeconomic  History   Marital status: Married    Spouse name: Not on file   Number of children: 1   Years of education: Not on file   Highest education level: Not on file  Occupational History   Occupation: Programmer  Tobacco Use   Smoking status: Never   Smokeless tobacco: Never  Vaping Use   Vaping Use: Never used  Substance and Sexual Activity   Alcohol use: No   Drug use: No   Sexual activity: Yes    Birth control/protection: Injection  Other Topics Concern   Not on file  Social History Narrative   Remarried in 2018. Has one daughter, one grandchild and 2 step children. Works from home   Social Determinants of Corporate investment banker Strain: Not on [employer] Insecurity: Not on file  Transportation Needs: Not on file   Physical Activity: Not on file  Stress: Not on file  Social Connections: Not on file  Intimate Partner Violence: Not on file     Constitutional: Pt reports fever, chills and body aches. Denies fatigue, headache or abrupt weight changes.  HEENT: Pt reports nasal congestion and sore throat. Denies eye pain, eye redness, ear pain, ringing in the ears, wax buildup, runny nose, bloody nose. Respiratory: Pt reports cough. Denies difficulty breathing, shortness of breath.   Cardiovascular: Denies chest pain, chest tightness, palpitations or swelling in the hands or feet.  Gastrointestinal: Denies abdominal pain, bloating, constipation, diarrhea or blood in the stool.    No other specific complaints in a complete review of systems (except as listed in HPI above).  Observations/Objective:   Wt Readings from Last 3 Encounters:  07/02/21 (!) 322 lb 12.8 oz (146.4 kg)  06/03/21 (!) 327 lb (148.3 kg)  11/26/20 (!) 318 lb (144.2 kg)    General: Appears her stated age,  in NAD. HEENT: Head: normal shape and size;  Nose: congestion noted; Throat/Mouth: hoarseness noted Pulmonary/Chest: Normal effort. No respiratory distress.  Neurological: Alert and oriented.  BMET    Component Value Date/Time   NA 140 01/17/2018 1453   NA 144 06/24/2017 1439   NA 139 06/27/2014 1356   K 4.8 01/17/2018 1453   K 3.9 06/27/2014 1356   CL 110 01/17/2018 1453   CL 106 06/27/2014 1356   CO2 23 01/17/2018 1453   CO2 28 06/27/2014 1356   GLUCOSE 107 (H) 01/17/2018 1453   GLUCOSE 89 06/27/2014 1356   BUN 25 (H) 01/17/2018 1453   BUN 12 06/24/2017 1439   BUN 7 06/27/2014 1356   CREATININE 1.00 05/15/2019 1459   CREATININE 0.90 10/15/2017 1034   CALCIUM 9.2 01/17/2018 1453   CALCIUM 8.3 (L) 06/27/2014 1356   GFRNONAA 43 (L) 01/17/2018 1453   GFRNONAA 77 10/15/2017 1034   GFRAA 50 (L) 01/17/2018 1453   GFRAA 89 10/15/2017 1034    Lipid Panel  No results found for: CHOL, TRIG, HDL, CHOLHDL, VLDL,  LDLCALC  CBC    Component Value Date/Time   WBC 4.9 06/03/2021 1334   RBC 4.21 06/03/2021 1334   HGB 11.3 (L) 06/03/2021 1334   HGB 12.4 11/26/2020 1143   HCT 35.5 06/03/2021 1334   HCT 39.9 11/26/2020 1143   PLT 262 06/03/2021 1334   PLT 290 11/26/2020 1143   MCV 84.3 06/03/2021 1334   MCV 85 11/26/2020 1143   MCV 83 09/07/2014 0810   MCH 26.8 (L) 06/03/2021 1334   MCHC 31.8 (L) 06/03/2021 1334   RDW 14.1 06/03/2021 1334  RDW 13.6 11/26/2020 1143   RDW 23.5 (H) 09/07/2014 0810   LYMPHSABS 1,009 06/03/2021 1334   LYMPHSABS 1.0 11/26/2020 1143   LYMPHSABS 0.2 (L) 09/07/2014 0810   MONOABS 0.6 02/16/2018 1131   MONOABS 0.4 09/07/2014 0810   EOSABS 0 (L) 06/03/2021 1334   EOSABS 0.0 11/26/2020 1143   EOSABS 0.0 09/07/2014 0810   BASOSABS 20 06/03/2021 1334   BASOSABS 0.0 11/26/2020 1143   BASOSABS 0.0 09/07/2014 0810    Hgb A1C Lab Results  Component Value Date   HGBA1C 5.3 10/15/2017       Assessment and Plan:  Nasal Congestion, Sore Throat, Cough, Fever, Chills and Body Aches:  Will have her come for a [place] flu test Advised her to repeat her home covid test as she may have tested too soon Encouraged rest and fluids Ok to continue Dayquil for now Could consider Prednisone for symptom management but no indication for antibiotics at this time She is outside the window for antiviral treatment for covid or flu  Will follow up after labs with further recommendation and treatment plan  Follow Up Instructions:    I discussed the assessment and treatment plan with the patient. The patient was provided an opportunity to ask questions and all were answered. The patient agreed with the plan and demonstrated an understanding of the instructions.   The patient was advised to call back or seek an in-person evaluation if the symptoms worsen or if the condition fails to improve as anticipated.    Nicki Reaper, NP

## 2021-08-21 NOTE — Telephone Encounter (Signed)
The pt called back to the office to report that they both took the at HOME COVID test and they came up positive. She want to know if there is anything they can take to treat the symptoms. Please advise

## 2021-08-21 NOTE — Patient Instructions (Signed)
Viral Illness, Adult ?Viruses are tiny germs that can get into a person's body and cause illness. There are many different types of viruses, and they cause many types of illness. Viral illnesses can range from mild to severe. They can affect various parts of the body. ?Short-term conditions that are caused by a virus include colds and the flu (influenza). Long-term conditions that are caused by a virus include herpes, shingles, and HIV (human immunodeficiency virus) infection. A few viruses have been linked to certain cancers. ?What are the causes? ?Many types of viruses can cause illness. Viruses invade cells in your body, multiply, and cause the infected cells to work abnormally or die. When these cells die, they release more of the virus. When this happens, you develop symptoms of the illness, and the virus continues to spread to other cells. If the virus takes over the function of the cell, it can cause the cell to divide and grow out of control. This happens when a virus causes cancer. ?Different viruses get into the body in different ways. You can get a virus by: ?Swallowing food or water that has come in contact with the virus (is contaminated). ?Breathing in droplets that have been coughed or sneezed into the air by an infected person. ?Touching a surface that has been contaminated with the virus and then touching your eyes, nose, or mouth. ?Being bitten by an insect or animal that carries the virus. ?Having sexual contact with a person who is infected with the virus. ?Being exposed to blood or fluids that contain the virus, either through an open cut or during a transfusion. ?If a virus enters your body, your body's defense system (immune system) will try to fight the virus. You may be at higher risk for a viral illness if your immune system is weak. ?What are the signs or symptoms? ?You may have these symptoms, depending on the type of virus and the location of the cells that it invades: ?Cold and flu  viruses: ?Fever. ?Headache. ?Sore throat. ?Muscle aches. ?Stuffy nose (nasal congestion). ?Cough. ?Digestive system (gastrointestinal) viruses: ?Fever. ?Pain in the abdomen. ?Nausea. ?Diarrhea. ?Liver viruses (hepatitis): ?Loss of appetite. ?Tiredness. ?Skin or the white parts of your eyes turning yellow (jaundice). ?Brain and spinal cord viruses: ?Fever. ?Headache. ?Stiff neck. ?Nausea and vomiting. ?Confusion or sleepiness. ?Skin viruses: ?Warts. ?Itching. ?Rash. ?Sexually transmitted viruses: ?Discharge. ?Swelling. ?Redness. ?Rash. ?How is this diagnosed? ?This condition may be diagnosed based on one or more of the following: ?Symptoms. ?Medical history. ?Physical exam. ?Blood test, sample of mucus from your lungs (sputum sample), stool sample, or a swab of body fluids or a skin sore (lesion). ?How is this treated? ?Viruses can be hard to treat because they live within cells. Antibiotic medicines do not treat viruses because these medicines do not get inside cells. Treatment for a viral illness may include: ?Resting and drinking plenty of fluids. ?Medicines to relieve symptoms. These can include over-the-counter medicine for pain and fever, medicines for cough or congestion, and medicines to relieve diarrhea. ?Antiviral medicines. These medicines are available only for certain types of viruses. ?Some viral illnesses can be prevented with vaccinations. A common example is the flu shot. ?Follow these instructions at home: ?Medicines ?Take over-the-counter and prescription medicines only as told by your health care provider. ?If you were prescribed an antiviral medicine, take it as told by your health care provider. Do not stop taking the antiviral even if you start to feel better. ?Be aware of when antibiotics are   needed and when they are not needed. Antibiotics do not treat viruses. You may get an antibiotic if your health care provider thinks that you may have, or are at risk for, a bacterial infection and you  have a viral infection. ?Do not ask for an antibiotic prescription if you have been diagnosed with a viral illness. Antibiotics will not make your illness go away faster. ?Frequently taking antibiotics when they are not needed can lead to antibiotic resistance. When this develops, the medicine no longer works against the bacteria that it normally fights. ?General instructions ? ?Drink enough fluids to keep your urine pale yellow. ?Rest as much as possible. ?Return to your normal activities as told by your health care provider. Ask your health care provider what activities are safe for you. ?Keep all follow-up visits as told by your health care provider. This is important. ?How is this prevented? ?To reduce your risk of viral illness: ?Wash your hands often with soap and water for at least 20 seconds. If soap and water are not available, use hand sanitizer. ?Avoid touching your nose, eyes, and mouth, especially if you have not washed your hands recently. ?If anyone in your household has a viral infection, clean all household surfaces that may have been in contact with the virus. Use soap and hot water. You may also use bleach that you have added water to (diluted). ?Stay away from people who are sick with symptoms of a viral infection. ?Do not share items such as toothbrushes and water bottles with other people. ?Keep your vaccinations up to date. This includes getting a yearly flu shot. ?Eat a healthy diet and get plenty of rest. ?Contact a health care provider if: ?You have symptoms of a viral illness that do not go away. ?Your symptoms come back after going away. ?Your symptoms get worse. ?Get help right away if you have: ?Trouble breathing. ?A severe headache or a stiff neck. ?Severe vomiting or pain in your abdomen. ?These symptoms may represent a serious problem that is an emergency. Do not wait to see if the symptoms will go away. Get medical help right away. Call your local emergency services (911 in the  [place] Do not drive yourself to the hospital. ?Summary ?Viruses are types of germs that can get into a person's body and cause illness. Viral illnesses can range from mild to severe. They can affect various parts of the body. ?Viruses can be hard to treat. There are medicines to relieve symptoms, and there are some antiviral medicines. ?If you were prescribed an antiviral medicine, take it as told by your health care provider. Do not stop taking the antiviral even if you start to feel better. ?Contact a health care provider if you have symptoms of a viral illness that do not go away. ?This information is not intended to replace advice given to you by your health care provider. Make sure you discuss any questions you have with your health care provider. ?Document Revised: 12/11/2019 Document Reviewed: 06/06/2019 ?Elsevier Patient Education ? 2022 Elsevier Inc. ? ?

## 2021-08-21 NOTE — Telephone Encounter (Signed)
Called and discussed results with patients

## 2021-08-21 NOTE — Addendum Note (Signed)
Addended by: Khaliah Barnick W on: 08/21/2021 02:32 PM ° ° Modules accepted: Orders ° °

## 2021-08-21 NOTE — Addendum Note (Signed)
Addended by: Lorre Munroe on: 08/21/2021 02:46 PM   Modules accepted: Orders

## 2021-08-24 ENCOUNTER — Other Ambulatory Visit: Payer: Self-pay | Admitting: Internal Medicine

## 2021-08-24 NOTE — Telephone Encounter (Signed)
Requested medication (s) are due for refill today: no  Requested medication (s) are on the active medication list: yes  Last refill:  06/04/21 #12  Future visit scheduled: no  Notes to clinic:  pt was to switch to OTC after 12 weeks on 50000 Unit capsule   Requested Prescriptions  Pending Prescriptions Disp Refills   Vitamin D, Ergocalciferol, (DRISDOL) 1.25 MG (50000 UNIT) CAPS capsule [Pharmacy Med Name: VITAMIN D2 50,000IU (ERGO) CAP RX] 12 capsule 0    Sig: TAKE 1 CAPSULE BY MOUTH 1 TIME A WEEK FOR 12 WEEKS THEN START OTC VITAMIN EVERY DAY 3 2000 UNIT DAILY     Endocrinology:  Vitamins - Vitamin D Supplementation Failed - 08/24/2021  3:11 AM      Failed - 50,000 IU strengths are not delegated      Failed - Ca in normal range and within 360 days    Calcium  Date Value Ref Range Status  01/17/2018 9.2 8.9 - 10.3 mg/dL Final   Calcium, Total  Date Value Ref Range Status  06/27/2014 8.3 (L) 8.5 - 10.1 mg/dL Final          Failed - Phosphate in normal range and within 360 days    No results found for: PHOS        Failed - Vitamin D in normal range and within 360 days    Vit D, 25-Hydroxy  Date Value Ref Range Status  06/03/2021 18 (L) 30 - 100 ng/mL Final    Comment:    Vitamin D Status         25-OH Vitamin D: . Deficiency:                    <20 ng/mL Insufficiency:             20 - 29 ng/mL Optimal:                 > or = 30 ng/mL . For 25-OH Vitamin D testing on patients on  D2-supplementation and patients for whom quantitation  of D2 and D3 fractions is required, the Quest[employer]ureD(TM) 25-OH VIT D, (D2,D3), LC/MS/MS is recommended: order  code 28413 (patients >23yrs). See Note 1 . Note 1 . For additional information, please refer to  http://education.Quest[employer].com/faq/FAQ199  (This link is being provided for informational/ educational purposes only.)           Passed - Valid encounter within last 12 months    Recent Outpatient Visits            3 days ago Nasal congestion   [facility] [place], Salvadore Oxford, NP   2 months ago Generalized weakness   [facility] [place], Salvadore Oxford, NP   9 months ago Acute non-recurrent frontal sinusitis   [facility] Smitty Cords, DO   11 months ago Viral URI   [facility] Caro Laroche, DO   1 year ago Cough   [facility], Jodelle Gross, [place]

## 2021-09-08 ENCOUNTER — Ambulatory Visit: Payer: Self-pay | Admitting: *Deleted

## 2021-09-08 ENCOUNTER — Encounter: Payer: Self-pay | Admitting: Family Medicine

## 2021-09-08 ENCOUNTER — Other Ambulatory Visit: Payer: Self-pay

## 2021-09-08 ENCOUNTER — Ambulatory Visit (INDEPENDENT_AMBULATORY_CARE_PROVIDER_SITE_OTHER): Payer: 59 | Admitting: Family Medicine

## 2021-09-08 VITALS — BP 119/82 | HR 68 | Temp 98.0°F | Resp 18 | Ht 64.0 in | Wt 321.2 lb

## 2021-09-08 DIAGNOSIS — R053 Chronic cough: Secondary | ICD-10-CM | POA: Diagnosis not present

## 2021-09-08 DIAGNOSIS — I340 Nonrheumatic mitral (valve) insufficiency: Secondary | ICD-10-CM | POA: Diagnosis not present

## 2021-09-08 DIAGNOSIS — I493 Ventricular premature depolarization: Secondary | ICD-10-CM | POA: Diagnosis not present

## 2021-09-08 DIAGNOSIS — U099 Post covid-19 condition, unspecified: Secondary | ICD-10-CM

## 2021-09-08 DIAGNOSIS — R002 Palpitations: Secondary | ICD-10-CM

## 2021-09-08 MED ORDER — PREDNISONE 10 MG PO TABS: ORAL_TABLET | ORAL | 0 refills | Status: DC

## 2021-09-08 NOTE — Progress Notes (Signed)
Subjective:    Patient ID: Jody Lee, female    DOB: May 06, 1971, 51 y.o.   MRN: [id number]  Jody Lee is a 51 y.o. female presenting on 09/08/2021 for POST COVID (Pt complains of persistent cough post COVID. Also intermittent heart fluttering while lying down also coughing. X 5 days /The pt was seen by a Cardiologist x 8 yrs ago and was told that she had some heart arrhythmia, but unsure if it was related to her anemia at that time.)  PCP Nicki Reaper, FNP   HPI  Post-COVID Syndrome Persistent Cough Intermittent Heart Fluttering  Initial positive COVID 08/20/21 Tested negative for COVID last Tuesday 09/02/21  History of iron deficiency anemia, B12 and she was referred to Hematology to get iron infusions. Followed by Cardiology [facility] Dr Gwen Pounds around 2015) at that time, and was diagnosed with palpitations and lower Ventricular Wall was slightly thickened with LVH. Had EKG done, Holter Monitor, Exercise Stress Test, there is significant family history of heart disease, oldest brother age 35 MI passed, mother had valve replace and father had heart disease as well.  Currently, still having issue with continued cough, deeper wheezing  Taking OTC Tylenol Cold & Flu Previously on Steroid course with COVID 08/20/21, helped some but now persist cough Does not prefer albuterol inhaler PRN Does not smoke No asthma or COPD Husband vapes outside Denies any fever chills sweats, nausea vomiting headache  Depression screen [facility] 2/9 09/08/2021 06/03/2021 06/07/2019  Decreased Interest 0 0 0  Down, Depressed, Hopeless 0 0 0  PHQ - 2 Score 0 0 0  Altered sleeping 0 - -  Tired, decreased energy 0 - -  Change in appetite 1 - -  Feeling bad or failure about yourself  0 - -  Trouble concentrating 0 - -  Moving slowly or fidgety/restless 0 - -  Suicidal thoughts 0 - -  PHQ-9 Score 1 - -  Difficult doing work/chores Not difficult at all - -  Some recent data might be hidden     Social History   Tobacco Use   Smoking status: Never   Smokeless tobacco: Never  Vaping Use   Vaping Use: Never used  Substance Use Topics   Alcohol use: No   Drug use: No    Review of Systems Per HPI unless specifically indicated above     Objective:    BP 119/82 (BP Location: Left Arm, Patient Position: Sitting, Cuff Size: Normal)    Pulse 68    Temp 98 F (36.7 C) (Temporal)    Resp 18    Ht  (1.626 m)    Wt (!) 321 lb 3.2 oz (145.7 kg)    SpO2 100%    BMI 55.13 kg/m   Wt Readings from Last 3 Encounters:  09/08/21 (!) 321 lb 3.2 oz (145.7 kg)  07/02/21 (!) 322 lb 12.8 oz (146.4 kg)  06/03/21 (!) 327 lb (148.3 kg)    Physical Exam Vitals and nursing note reviewed.  Constitutional:      General: She is not in acute distress.    Appearance: She is well-developed. She is obese. She is not diaphoretic.     Comments: Well-appearing, comfortable, cooperative  HENT:     Head: Normocephalic and atraumatic.  Eyes:     General:        Right eye: No discharge.        Left eye: No discharge.     Conjunctiva/sclera: Conjunctivae normal.  Neck:  Thyroid: No thyromegaly.  Cardiovascular:     Rate and Rhythm: Normal rate and regular rhythm.     Heart sounds: Normal heart sounds. No murmur heard. Pulmonary:     Effort: Pulmonary effort is normal. No respiratory distress.     Breath sounds: Wheezing present. No rales.     Comments: Coarse breath sounds tight air movement, with frequent cough Musculoskeletal:        General: Normal range of motion.     Cervical back: Normal range of motion and neck supple.  Lymphadenopathy:     Cervical: No cervical adenopathy.  Skin:    General: Skin is warm and dry.     Findings: No erythema or rash.  Neurological:     Mental Status: She is alert and oriented to person, place, and time.  Psychiatric:        Behavior: Behavior normal.     Comments: Well groomed, good eye contact, normal speech and thoughts     01/03/2014  2:58 PM EDT                     CARDIOLOGY DEPARTMENT                         [facility]                            [id number]                  [facility]                       Acct #: [id number]        [address] [place], [place] [address]             Date: 01/02/2014 03:10 PM                                                                 Adult Female Age: 86 yrs                   ECHOCARDIOGRAM REPORT                         Outpatient                                                                 KC::KCWC          STUDY:CHEST WALL                      TAPE:            MD1:           ECHO:Yes   DOPPLER:Yes  FILE:            BP: 152/98 mmHg          COLOR:Yes  CONTRAST:No      MACHINE:Accuson            HR: 64      RV BIOPSY:No         3D:No   SOUND QLTY:Moderate           Height: 64 in         MEDIUM:None                                             Weight: 243 lb                                                                 BSA: 2.1 m2  ___________________________________________________________________________________________                 HISTORY:DOE                  REASON:Assess, LV function              INDICATION:786.05 Shortness of breath  ___________________________________________________________________________________________ ECHOCARDIOGRAPHIC MEASUREMENTS 2D DIMENSIONS AORTA             Values     Normal Range       MAIN PA          Values      Normal Range          Annulus:  1.8 cm   [2.1 - 2.5]                 PA Main:  nm*       [1.5 - 2.1]        Aorta Sin:  nm*      [2.7 - 3.3]        RIGHT VENTRICLE      ST Junction:  nm*      [2.3 - 2.9]                 RV Base:  nm*       [ < 4.2]        Asc.Aorta:  3.0 cm   [2.3 - 3.1]                  RV Mid:  nm*       [ < 3.5] LEFT VENTRICLE                                        RV Length:  nm*       [ < 8.6]            LVIDd:  5.1 cm   [3.9 - 5.3]         INFERIOR VENA CAVA            LVIDs:  3.3 cm  Max. IVC:  nm*       [ <= 2.1]               FS:  35.3 %   [> 25]                     Min. IVC:  nm*              SWT:  1.3 cm   [0.5 - 0.9]                    ------------------              PWT:  1.1 cm   [0.5 - 0.9]                    nm* - not measured LEFT ATRIUM          LA Diam:  3.9 cm   [2.7 - 3.8]      LA A4C Area:  nm*      [ < 20]        LA Volume:  nm*      [22 - 52]  ___________________________________________________________________________________________ ECHOCARDIOGRAPHIC DESCRIPTIONS  AORTIC ROOT        Size:Normal  Dissection:INDETERM FOR DISSECTION  AORTIC VALVE    Leaflets:Tricuspid        Morphology:Normal    Mobility:Fully mobile  LEFT VENTRICLE        Size:Normal             Anterior:Normal Contraction:Normal              Lateral:Normal             Closest EF 55%       Septal:Normal         LYY:TKPT LVH             Apical:Normal   LV Masses:No Masses          Inferior:Normal                               Posterior:Normal Dias.FxClass:N/A  MITRAL VALVE    Leaflets:Normal             Mobility:Fully mobile  Morphology:Normal  LEFT ATRIUM        Size:MILDLY ENLARGED   LA Masses:No masses   IA Septum:Normal IAS  MAIN PA        Size:Normal  PULMONIC VALVE  Morphology:Normal             Mobility:Fully mobile  RIGHT VENTRICLE   RV Masses:No Masses              Size:Normal   Free Wall:Normal          Contraction:Normal  TRICUSPID VALVE    Leaflets:Normal             Mobility:Fully mobile  Morphology:Normal  RIGHT ATRIUM        Size:Normal             RA Other:None     RA Mass:No masses  PERICARDIUM       Fluid:No effusion  INFERIOR VENACAVA        Size:Normal Normal respiratory collapse   _____________________________________________________________________ DOPPLER ECHO and OTHER SPECIAL PROCEDURES   Aortic:MILD AR  No AS          176.0  cm/sec peak vel          12.4 mmHg peak grad    Mitral:MILD MR                        No MS          MV Inflow E Vel=119.8 cm/sec   MV Annulus E'Vel=9.3 cm/sec          E/E'Ratio=12.9  Tricuspid:MILD TR                        No TS          229.8 cm/sec peak TR vel       26.1 mmHg peak RV pressure  Pulmonary:No PR                          No PS          102.7 cm/sec peak vel    ___________________________________________________________________________________________  INTERPRETATION NORMAL LEFT VENTRICULAR SYSTOLIC FUNCTION   WITH MILD LVH MILD VALVULAR REGURGITATION (See above) NO VALVULAR STENOSIS  ___________________________________________________________________________________________  Electronically signed by: MD Arnoldo Hooker on 01/03/2014 02:58 PM            Performed By: Merla Riches, RDCS      Ordering Physician: Purnell Shoemaker ___________________________________________________________________________________________     Imaging Results - Echocardiogram 2D complete (01/02/2014 4:00 PM EDT) Procedure Note  Olena Heckle, MD - 01/03/2014   Formatting of this note might be different from the original. CARDIOLOGY DEPARTMENT DOLCE, SYLVIA [facility]IC [id number] [facility] Acct #: [id number] [address] [place], [place] [address] Date: 01/02/2014 03:10 PM Adult Female Age: 58 yrs ECHOCARDIOGRAM REPORT Outpatient KC::KCWC STUDY:CHEST WALL TAPE: MD1: ECHO:Yes DOPPLER:Yes FILE: BP: 152/98 mmHg COLOR:Yes CONTRAST:No MACHINE:Accuson HR: 64 RV BIOPSY:No 3D:No SOUND QLTY:Moderate Height: 64 in MEDIUM:None Weight: 243 lb BSA: 2.1 m2  ___________________________________________________________________________________________ HISTORY:DOE REASON:Assess, LV function INDICATION:786.05 Shortness of breath  ___________________________________________________________________________________________ ECHOCARDIOGRAPHIC MEASUREMENTS 2D  DIMENSIONS AORTA Values Normal Range MAIN PA Values  Normal Range Annulus: 1.8 cm [2.1 - 2.5] PA Main: nm*  [1.5 - 2.1] Aorta Sin: nm* [2.7 - 3.3] RIGHT VENTRICLE ST Junction: nm* [2.3 - 2.9] RV Base: nm*  [ < 4.2] Asc.Aorta: 3.0 cm [2.3 - 3.1] RV Mid: nm*  [ < 3.5] LEFT VENTRICLE RV Length: nm*  [ < 8.6] LVIDd: 5.1 cm [3.9 - 5.3] INFERIOR VENA CAVA LVIDs: 3.3 cm Max. IVC: nm*  [ <= 2.1] FS: 35.3 % [> 25] Min. IVC: nm* SWT: 1.3 cm [0.5 - 0.9] ------------------ PWT: 1.1 cm [0.5 - 0.9] nm* - not measured LEFT ATRIUM LA Diam: 3.9 cm [2.7 - 3.8] LA A4C Area: nm* [ < 20] LA Volume: nm* [22 - 52]  ___________________________________________________________________________________________ ECHOCARDIOGRAPHIC DESCRIPTIONS  AORTIC ROOT Size:Normal Dissection:INDETERM FOR DISSECTION  AORTIC VALVE Leaflets:Tricuspid Morphology:Normal Mobility:Fully mobile  LEFT VENTRICLE Size:Normal Anterior:Normal Contraction:Normal Lateral:Normal Closest EF 55% Septal:Normal UXL:KGMW LVH Apical:Normal LV Masses:No Masses Inferior:Normal Posterior:Normal Dias.FxClass:N/A  MITRAL VALVE Leaflets:Normal Mobility:Fully mobile Morphology:Normal  LEFT ATRIUM Size:MILDLY ENLARGED LA Masses:No masses IA Septum:Normal IAS  MAIN PA Size:Normal  PULMONIC VALVE Morphology:Normal Mobility:Fully mobile  RIGHT VENTRICLE RV Masses:No Masses Size:Normal Free Wall:Normal Contraction:Normal  TRICUSPID VALVE Leaflets:Normal Mobility:Fully mobile Morphology:Normal  RIGHT ATRIUM Size:Normal RA Other:None RA Mass:No masses  PERICARDIUM Fluid:No effusion  INFERIOR VENACAVA Size:Normal Normal respiratory  collapse   _____________________________________________________________________ DOPPLER ECHO and OTHER SPECIAL PROCEDURES Aortic:MILD AR No AS 176.0 cm/sec peak vel 12.4 mmHg peak grad  Mitral:MILD MR No MS MV Inflow E Vel=119.8 cm/sec MV Annulus E'Vel=9.3  cm/sec E/E'Ratio=12.9  Tricuspid:MILD TR No TS 229.8 cm/sec peak TR vel 26.1 mmHg peak RV pressure  Pulmonary:No PR No PS 102.7 cm/sec peak vel    ___________________________________________________________________________________________  INTERPRETATION NORMAL LEFT VENTRICULAR SYSTOLIC FUNCTION WITH MILD LVH MILD VALVULAR REGURGITATION (See above) NO VALVULAR STENOSIS  ___________________________________________________________________________________________  Electronically signed by: MD Arnoldo Hooker on 01/03/2014 02:58 PM Performed By: Merla Riches, RDCS Ordering Physician: Purnell Shoemaker ___________________________________________________________________________________________   Imaging Results - Echocardiogram 2D complete (01/02/2014 4:00 PM EDT) Authorizing Provider Result Type  Brendia Sacks NP CV ECHO ORDERABLES    Results for orders placed or performed in visit on 08/21/21  POCT Influenza A/B  Result Value Ref Range   Influenza A, POC Negative Negative   Influenza B, POC Negative Negative      Assessment & Plan:   Problem List Items Addressed This Visit   None Visit Diagnoses     Post-COVID chronic cough    -  Primary   Relevant Medications   predniSONE (DELTASONE) 10 MG tablet   Heart palpitations       Relevant Orders   Ambulatory referral to Cardiology   Mild mitral insufficiency       Relevant Orders   Ambulatory referral to Cardiology   Intermittent palpitations       Relevant Orders   Ambulatory referral to Cardiology   PVC's (premature ventricular contractions)       Relevant Orders   Ambulatory referral to Cardiology       Post COVID cough Reassurance Start nasal steroid Flonase 2 sprays in each nostril daily for 4-6 weeks, may repeat course seasonally or as needed Repeat steroid taper 6 days  Intermittent palpitations Mitral Insufficiency Fam history CAD 8 years since prior cardio referral evaluation, now having  notable increased intermittent palpitations brief episode, and some dyspnea at times with activity. She has history of prior work up with Holter EKG Stress Testing ECHO with PVCs and benign arrhythmia and Mitral Tricuspid Insufficiency, however now some recent worsening post covid, and has strong family history of CAD, MI, Valve replacement. Requesting repeat evaluation by Cardiology now at age 54 with risk factors as well.   Meds ordered this encounter  Medications   predniSONE (DELTASONE) 10 MG tablet    Sig: Take 6 tabs with breakfast Day 1, 5 tabs Day 2, 4 tabs Day 3, 3 tabs Day 4, 2 tabs Day 5, 1 tab Day 6.    Dispense:  21 tablet    Refill:  0   Orders Placed This Encounter  Procedures   Ambulatory referral to Cardiology    Referral Priority:   Routine    Referral Type:   Consultation    Referral Reason:   Specialty Services Required    Requested Specialty:   Cardiology    Number of Visits Requested:   1     Follow up plan: Return if symptoms worsen or fail to improve.   Saralyn Pilar, DO [facility] 09/08/2021, 9:33 AM

## 2021-09-08 NOTE — Patient Instructions (Addendum)
Thank you for coming to the office today.  [facility] ([facility]) Dr Serafina Royals  [facility] [place], [place]  [address] Phone: [phone]  For covid cough  Can use the Flonase nasal spray at home.  Start steroid taper over 6 days   Please schedule a Follow-up Appointment to: Return if symptoms worsen or fail to improve.  If you have any other questions or concerns, please feel free to call the office or send a message through [place]. You may also schedule an earlier appointment if necessary.  Additionally, you may be receiving a survey about your experience at our office within a few days to 1 week by e-mail or mail. We value your feedback.  Nobie Putnam, DO [place]

## 2021-09-08 NOTE — Telephone Encounter (Signed)
°  Chief Complaint: heart fluttering that is causing her to cough Symptoms: heart fluttering that causes her to cough  Frequency: Intermittently over the last 5 days.   Started feeling weak yesterday.   Getting over Covid Pertinent Negatives: Patient denies chest pain. Disposition: [] ED /[] Urgent Care (no appt availability in office) / [x] Appointment(In office/virtual)/ []  [place] [facility]/ [] Home Care/ [] Refused Recommended Disposition /[] [place] [facility]/ []  Follow-up with PCP Additional Notes: Got her scheduled for today.

## 2021-09-08 NOTE — Telephone Encounter (Signed)
Reason for Disposition  [1] Heart beating very rapidly (e.g., > 140 / minute) AND [2] not present now  (Exception: during exercise)  Answer Assessment - Initial Assessment Questions 1. DESCRIPTION: "Please describe your heart rate or heartbeat that you are having" (e.g., fast/slow, regular/irregular, skipped or extra beats, "palpitations")     Pt c/o heart fluttering for a few days.   When it flutters I have to cough.   I'm feeling weak also since yesterday.   I couldn't sleep due to the fluttering and coughing.    I have a mild arrhythmia and saw a cardiologist years ago.   2. ONSET: "When did it start?" (Minutes, hours or days)      Off and on over the last 5 days.  No chest pain.   Yesterday I had a little shortness of breath while getting groceries.    I'm getting over Covid.   10 days ago I positive for Covid.   I was too far along to get the antiviral.    3. DURATION: "How long does it last" (e.g., seconds, minutes, hours)     *No Answer* 4. PATTERN "Does it come and go, or has it been constant since it started?"  "Does it get worse with exertion?"   "Are you feeling it now?"     *No Answer* 5. TAP: "Using your hand, can you tap out what you are feeling on a chair or table in front of you, so that I can hear?" (Note: not all patients can do this)       *No Answer* 6. HEART RATE: "Can you tell me your heart rate?" "How many beats in 15 seconds?"  (Note: not all patients can do this)       *No Answer* 7. RECURRENT SYMPTOM: "Have you ever had this before?" If Yes, ask: "When was the last time?" and "What happened that time?"      *No Answer* 8. CAUSE: "What do you think is causing the palpitations?"     *No Answer* 9. CARDIAC HISTORY: "Do you have any history of heart disease?" (e.g., heart attack, angina, bypass surgery, angioplasty, arrhythmia)      *No Answer* 10. OTHER SYMPTOMS: "Do you have any other symptoms?" (e.g., dizziness, chest pain, sweating, difficulty breathing)       *No  Answer* 11. PREGNANCY: "Is there any chance you are pregnant?" "When was your last menstrual period?"       *No Answer*  Protocols used: Heart Rate and Heartbeat Questions-A-AH

## 2021-09-23 ENCOUNTER — Ambulatory Visit: Payer: 59 | Admitting: Neurology

## 2021-11-05 ENCOUNTER — Other Ambulatory Visit: Payer: Self-pay | Admitting: Neurology

## 2021-11-05 MED ORDER — TRAMADOL HCL 50 MG PO TABS: ORAL_TABLET | ORAL | 0 refills | Status: DC

## 2021-11-05 NOTE — Telephone Encounter (Signed)
Last OV was on 07/02/22.  ?Next OV is scheduled for 12/31/21.  ?Last RX was written on 03/17/21 for 60 tabs.  ? ?[facility] has been reviewed.  ?

## 2021-12-29 ENCOUNTER — Telehealth: Payer: Self-pay | Admitting: *Deleted

## 2021-12-29 NOTE — Telephone Encounter (Signed)
Received fax from [facility] that current PA on file for modafinil about to expire. I reviewed chart, looks like it will expire 01/20/22.   I submitted new PA on CMM. Key: [id number]. Waiting on determination from [facility].

## 2021-12-30 NOTE — Telephone Encounter (Signed)
PA approved 12/29/2021 - 07/01/2022. Case ID: [id number].

## 2021-12-31 ENCOUNTER — Ambulatory Visit (INDEPENDENT_AMBULATORY_CARE_PROVIDER_SITE_OTHER): Payer: 59 | Admitting: Family Medicine

## 2021-12-31 ENCOUNTER — Encounter: Payer: Self-pay | Admitting: Family Medicine

## 2021-12-31 VITALS — BP 143/84 | HR 73 | Ht 64.0 in | Wt 320.0 lb

## 2021-12-31 DIAGNOSIS — E559 Vitamin D deficiency, unspecified: Secondary | ICD-10-CM

## 2021-12-31 DIAGNOSIS — G35 Multiple sclerosis: Secondary | ICD-10-CM | POA: Diagnosis not present

## 2021-12-31 MED ORDER — ARMODAFINIL 250 MG PO TABS: 250.0000 mg | ORAL_TABLET | Freq: Every day | ORAL | 1 refills | Status: DC

## 2021-12-31 NOTE — Patient Instructions (Signed)
Below is our plan:  We will continue Ocrevus infusions. Continue baclofen and gabapentin as needed. We will try armodafinil to replace modafinil. I have ordered repeat imaging.   Please make sure you are staying well hydrated. I recommend 50-60 ounces daily. Well balanced diet and regular exercise encouraged. Consistent sleep schedule with 6-8 hours recommended.   Please continue follow up with care team as directed.   Follow up with me in 6 months   You may receive a survey regarding today's visit. I encourage you to leave honest feed back as I do use this information to improve patient care. Thank you for seeing me today!

## 2021-12-31 NOTE — Progress Notes (Addendum)
Chief Complaint  Patient presents with   Follow-up    Rm 11, alone. Here for 6 month MS f/u, on Ocrevus and tolerating well. Last infusion date: 11/17/2021 and Next infusion date: 05/18/2022. MS stable.  She has been noticing problems with cognitive and modafinil works most days. She finds that it is more difficult to handle multiple task. (She has failed that adderall) she would be interested in trying armodafinil at a higher strength to see if that is better coverage.     HISTORY OF PRESENT ILLNESS:  12/31/21 ALL:  Jody Lee is a 51 y.o. female here today for follow up for RRMS. She continues Ocrevus infusions. Last infusion 11/17/2021, next 05/18/2022. She is tolerating well. Last MRI 05/2019 was stable.   She feels that she is doing fairly well. No new or exacerbating symptoms. She does have some R>L foot drop and spastic gait. No falls or assistive devices. She takes baclofen as needed, usually 20mg  in morning.  She is prescribed gabapentin 100mg  TID as needed for dysesthesias but rarely uses.   Mood is good. She is sleeping well. She continues to work in . She has taken modafinil for years and feels it has worked fairly well until recently. She finds that she is needing to take an extra dose of modafinil at times to help get her through the day. She walks for exercise on the weekends.   Tramadol 50mg  BID usually used about a month before her infusion when pain worsens. Has some MS hug, back and shoulder pain. She last refilled on 11/05/2021 for 14 tablets.    Vitamin D 3000iu daily. She is followed by Consulting civil engineer, NP for PCP needs but has not been seen in over a year.    HISTORY (copied from Dr previous note)  Jody Lee is a 51 y.o. woman with relapsing remitting MS diagnosed in 2005.      Update 07/02/2021: She is on Ocrevus and had her last infusion September, 2022.    IgG/IgM was normal 11/26/2020.  Vit D was 21.2 so she is back on supplements.  .       Her gait is  stable with a mild right foot drop when she walks longer distance.    She has dysesthetic pain, especially in the mid back with a squeezing sensation.   She feels she did worse the last 2 weeks of her last infusion cycle..   She takes tramadol when more painful.   Gabapentin and baclofen also help.     She has pain in the left hand and making a fist is harder.   She wears a brace.  Spasms are more panful at night in her legs.     She has bowel urgency but no incontinence.   No UTI.  Vision is stable.   She sees ophthalmology next week.      She had gained weight (+ 8 pounds since last visit, but greater than 80 pounds since her surgery).      Phentermine was not well tolerated (palpitations).   She had bariatric surgery (Roex en Y) in the past and had stable weight for a few years.   Unfortunately over the last 2 years she gained much of it back.  I did discuss medical weight loss referral with her and she will consider this.   She has fatigue, both physical and cognitive.  07/04/2021   She works (from home).   She sleeps well most nights but  is sleeping less due to the hours.   She is tired during the day and takes naps .   Provigil helps sleepiness.      PSG in 11/2018 did not show OSA (AHI = 3.3)   She has cognitive issues with reduced focus/attention at times.        EPWORTH SLEEPINESS SCALE   On a scale of 0 - 3 what is the chance of dozing:   Sitting and Reading:                          1                                            Watching TV:                                      2 Sitting inactive in a public place:       0 Passenger in car for one hour:          0 Lying down to rest in the afternoon:  2 Sitting and talking to someone:         0 Sitting quietly after lunch:                  0 In a car, stopped in traffic:                 0   Total (out of 24):   5/24   Not sleepy.       MS History:   She was diagnosed with MS in 2005 after presenting with a spinal cord syndrome. She started  with numbness and pain in the feet that worked its way up to her abdomen over a couple days.   Initially, she had an MRI of the spine that showed a plaque consistent with her symptoms. She was referred to Dr. Maple Hudson. He did a lumbar puncture and ordered an MRI of the brain. Both were consistent with multiple sclerosis and I started to see her. Initially, she was placed on Betaseron and did very well for the first few years. Then around 2011 or 2012, she had an exacerbation and testing showed that she had developed antibodies against Betaseron. Therefore, she was switched to Gilenya at that time. She has been on Gilenya for the last 4+ years and has done well. Specifically, she has not reported any difficulties with exacerbations and she tolerates the medication well.   Her last MRI was performed at [facility] November 2015.  MRI of the brain from 03/02/2013 showed  typical periventricular T2/FLAIR hyperintense foci, predominantly in the periventricular white matter.   There were no acute foci.  MRI in 06/15/2017 showed a new enahncing lesion so she switched to Ocrevus  Late 2018.    MRI of the brain 05/15/2019 showed no new lesions.   MRI of the brain 04/17/2018 showed no new lesions.  Enhancement previously seen on the 06/15/2017 MRI had resolved.   MRI of the brain 06/15/2017 showed 1 enhancing lesion in the right periatrial white matter not present on the 2017 MRI.  The MRI was otherwise unchanged.   MRI of the cervical spine 06/17/2017 showed about 5 or 6 T2 hyperintense  foci within the spinal cord located at C2, C2-C3, C4, C5, C6-C7 and T2-T3.  None of the foci were enhanced.  MRI was felt to be technically better than the 2017 MRI and some interval change could not be ruled out.   Other: We did a PSG 11/2018 but she had only negligible OSA with an AHI = 3.3.   An optional oral appliance was discussed for her snoring.     REVIEW OF SYSTEMS: Out of a complete 14 system review of symptoms,  the patient complains only of the following symptoms, foot drop, gait abnormality, numbness, tingling, fatigue, difficulty concentrating, and all other reviewed systems are negative.   ALLERGIES: Allergies  Allergen Reactions   Nsaids Other (See Comments)    Not supposed to take d/t bariatric surgery    Phentermine     Made her heart race   Ciprofloxacin Rash    Felt like heat/burning rash     HOME MEDICATIONS: Outpatient Medications Prior to Visit  Medication Sig Dispense Refill   acetaminophen (TYLENOL) 325 MG tablet Take 650 mg by mouth every 4 (four) hours as needed.     baclofen (LIORESAL) 10 MG tablet TAKE 1 TABLET(10 MG) BY MOUTH THREE TIMES DAILY (Patient taking differently: Take 10 mg by mouth 3 (three) times daily as needed. TAKE 1 TABLET(10 MG) BY MOUTH THREE TIMES DAILY) 270 tablet 3   Cholecalciferol (VITAMIN D3) 75 MCG (3000 UT) TABS Take 3,000 Units by mouth daily.     gabapentin (NEURONTIN) 100 MG capsule TAKE 1 CAPSULE(100 MG) BY MOUTH THREE TIMES DAILY (Patient taking differently: Take 100 mg by mouth 3 (three) times daily as needed.) 90 capsule 2   modafinil (PROVIGIL) 200 MG tablet Take 1 tablet (200 mg total) by mouth daily. 90 tablet 1   ocrelizumab 600 mg in sodium chloride 0.9 % 500 mL Inject 600 mg into the vein once.     traMADol (ULTRAM) 50 MG tablet 1 pill po bid prn 60 tablet 0   predniSONE (DELTASONE) 10 MG tablet Take 6 tabs with breakfast Day 1, 5 tabs Day 2, 4 tabs Day 3, 3 tabs Day 4, 2 tabs Day 5, 1 tab Day 6. 21 tablet 0   Pseudoephedrine-APAP-DM (DAYQUIL PO) Take by mouth.     Facility-Administered Medications Prior to Visit  Medication Dose Route Frequency Provider Last Rate Last Admin   gadopentetate dimeglumine (MAGNEVIST) injection 20 mL  20 mL Intravenous Once PRN Sater, Pearletha Furl, MD         PAST MEDICAL HISTORY: Past Medical History:  Diagnosis Date   Anemia    Hypertension    Menorrhagia    MS (multiple sclerosis) (HCC)    Shingles     right side of chest and back   Vision abnormalities    Vitamin B 12 deficiency    Vitamin D deficiency      PAST SURGICAL HISTORY: Past Surgical History:  Procedure Laterality Date   CHOLECYSTECTOMY     ESOPHAGOGASTRODUODENOSCOPY (EGD) WITH PROPOFOL N/A 12/18/2019   Procedure: ESOPHAGOGASTRODUODENOSCOPY (EGD) WITH PROPOFOL;  Surgeon: Pasty Spillers, MD;  Location: [facility] END[facility];  Service: Endoscopy;  Laterality: N/A;   GASTRIC BYPASS       FAMILY HISTORY: Family History  Problem Relation Age of Onset   Stroke Mother    Heart disease Mother    Asthma Mother    Diabetes Mother    Hypertension Father    Heart disease Father    Diabetes Father  Asthma Brother    Alcoholism Brother    Obesity Brother    Heart disease Brother    Diabetes Maternal Grandmother    Lung cancer Maternal Grandfather    Breast cancer Neg Hx      SOCIAL HISTORY: Social History   Socioeconomic History   Marital status: Married    Spouse name: Not on file   Number of children: 1   Years of education: Not on file   Highest education level: Not on file  Occupational History   Occupation: Magazine features editor  Tobacco Use   Smoking status: Never   Smokeless tobacco: Never  Vaping Use   Vaping Use: Never used  Substance and Sexual Activity   Alcohol use: No   Drug use: No   Sexual activity: Yes    Birth control/protection: Injection  Other Topics Concern   Not on file  Social History Narrative   Remarried in 2018. Has one daughter, one grandchild and 2 step children. Works from home   Social Determinants of Corporate investment banker Strain: Not on [employer] Insecurity: Not on file  Transportation Needs: Not on file  Physical Activity: Not on file  Stress: Not on file  Social Connections: Not on file  Intimate Partner Violence: Not on file      PHYSICAL EXAM  Vitals:   12/31/21 1440  BP: (!) 143/84  Pulse: 73  Weight: (!) 320 lb (145.2 kg)  Height: 5\' 4"  (1.626 m)     Body mass index is 54.93 kg/m.   Generalized: Well developed, in no acute distress  Cardiology: normal rate and rhythm, no murmur auscultated  Respiratory: clear to auscultation bilaterally    Neurological examination  Mentation: Alert oriented to time, place, history taking. Follows all commands speech and language fluent Cranial nerve II-XII: Pupils were equal round reactive to light. Extraocular movements were full, visual field were full on confrontational test. Facial sensation and strength were normal. Head turning and shoulder shrug  were normal and symmetric. Motor: The motor testing reveals 5 over 5 strength of all 4 extremities. Good symmetric motor tone is noted throughout.  Sensory: Sensory testing is intact to soft touch on all 4 extremities. No evidence of extinction is noted.  Coordination: Cerebellar testing reveals good finger-nose-finger and heel-to-shin bilaterally.  Gait and station: Gait is normal. No foot drop noted  Reflexes: Deep tendon reflexes are symmetric and normal bilaterally.     DIAGNOSTIC DATA (LABS, IMAGING, TESTING) - I reviewed patient records, labs, notes, testing and imaging myself where available.  Lab Results  Component Value Date   WBC 4.9 06/03/2021   HGB 11.3 (L) 06/03/2021   HCT 35.5 06/03/2021   MCV 84.3 06/03/2021   PLT 262 06/03/2021      Component Value Date/Time   NA 140 01/17/2018 1453   NA 144 06/24/2017 1439   NA 139 06/27/2014 1356   K 4.8 01/17/2018 1453   K 3.9 06/27/2014 1356   CL 110 01/17/2018 1453   CL 106 06/27/2014 1356   CO2 23 01/17/2018 1453   CO2 28 06/27/2014 1356   GLUCOSE 107 (H) 01/17/2018 1453   GLUCOSE 89 06/27/2014 1356   BUN 25 (H) 01/17/2018 1453   BUN 12 06/24/2017 1439   BUN 7 06/27/2014 1356   CREATININE 1.00 05/15/2019 1459   CREATININE 0.90 10/15/2017 1034   CALCIUM 9.2 01/17/2018 1453   CALCIUM 8.3 (L) 06/27/2014 1356   PROT 6.6 05/16/2019 1626   PROT 6.9  06/27/2014 1356    ALBUMIN 4.3 05/16/2019 1626   ALBUMIN 3.8 06/27/2014 1356   AST 15 05/16/2019 1626   AST 15 06/27/2014 1356   ALT 11 05/16/2019 1626   ALT 20 06/27/2014 1356   ALKPHOS 141 (H) 11/01/2019 1359   ALKPHOS 105 06/27/2014 1356   BILITOT 0.3 05/16/2019 1626   BILITOT 0.4 06/27/2014 1356   GFRNONAA 43 (L) 01/17/2018 1453   GFRNONAA 77 10/15/2017 1034   GFRAA 50 (L) 01/17/2018 1453   GFRAA 89 10/15/2017 1034   No results found for: CHOL, HDL, LDLCALC, LDLDIRECT, TRIG, CHOLHDL Lab Results  Component Value Date   HGBA1C 5.3 10/15/2017   Lab Results  Component Value Date   VITAMINB12 481 06/03/2021   Lab Results  Component Value Date   TSH 2.03 06/03/2021        View : No data to display.               View : No data to display.           ASSESSMENT AND PLAN  51 y.o. year old female  has a past medical history of Anemia, Hypertension, Menorrhagia, MS (multiple sclerosis) (HCC), Shingles, Vision abnormalities, Vitamin B 12 deficiency, and Vitamin D deficiency. here with   Relapsing remitting multiple sclerosis (HCC) - Plan: CBC with Differential/Platelets, IgG, IgA, IgM, MR BRAIN W WO CONTRAST  Vitamin D deficiency - Plan: Vitamin D, 25-hydroxy   Jody Lee is doing well, today. She will continue Ocrevus infusions, next scheduled for 05/2022 We will update labs. Will will order updated MRI for monitoring. She will continue baclofen, gabapentin, tramadol and vitamin D as directed. I will switch modafinil to armodafinil to see if this helps with cognitive fog and MS fatigue. Healthy lifestyle habits encouraged. She will follow up with me in 6 months.    Orders Placed This Encounter  Procedures   MR BRAIN W WO CONTRAST    Standing Status:   Future    Standing Expiration Date:   01/01/2023    Order Specific Question:   If indicated for the ordered procedure, I authorize the administration of contrast media per Radiology protocol    Answer:   Yes    Order Specific Question:    What is the patient's sedation requirement?    Answer:   No Sedation    Order Specific Question:   Does the patient have a pacemaker or implanted devices?    Answer:   No    Order Specific Question:   Preferred imaging location?    Answer:   [facility] (table limit - 550lbs)   CBC with Differential/Platelets   IgG, IgA, IgM   Vitamin D, 25-hydroxy     No orders of the defined types were placed in this encounter.   Shawnie Dapper, MSN, FNP-C 12/31/2021, 3:13 PM  [facility] [address], [address] [place], [place] [address] [phone]   I have read the note, and I agree with the clinical assessment and plan.  Richard A. Epimenio Foot, MD, PhD, [facility] Certified in Neurology, Clinical Neurophysiology, Sleep Medicine, Pain Medicine and Neuroimaging  [facility] [address] [place], [place] [address] [phone]

## 2022-01-01 ENCOUNTER — Telehealth: Payer: Self-pay | Admitting: Family Medicine

## 2022-01-01 LAB — CBC WITH DIFFERENTIAL/PLATELET
Basophils Absolute: 0 10*3/uL (ref 0.0–0.2)
Basos: 0 %
EOS (ABSOLUTE): 0 10*3/uL (ref 0.0–0.4)
Eos: 0 %
Hematocrit: 37.5 % (ref 34.0–46.6)
Hemoglobin: 11.8 g/dL (ref 11.1–15.9)
Immature Grans (Abs): 0 10*3/uL (ref 0.0–0.1)
Immature Granulocytes: 0 %
Lymphocytes Absolute: 1 10*3/uL (ref 0.7–3.1)
Lymphs: 16 %
MCH: 26.5 pg — ABNORMAL LOW (ref 26.6–33.0)
MCHC: 31.5 g/dL (ref 31.5–35.7)
MCV: 84 fL (ref 79–97)
Monocytes Absolute: 0.7 10*3/uL (ref 0.1–0.9)
Monocytes: 11 %
Neutrophils Absolute: 4.5 10*3/uL (ref 1.4–7.0)
Neutrophils: 73 %
Platelets: 283 10*3/uL (ref 150–450)
RBC: 4.46 x10E6/uL (ref 3.77–5.28)
RDW: 13.2 % (ref 11.7–15.4)
WBC: 6.2 10*3/uL (ref 3.4–10.8)

## 2022-01-01 LAB — IGG, IGA, IGM
IgA/Immunoglobulin A, Serum: 153 mg/dL (ref 87–352)
IgG (Immunoglobin G), Serum: 678 mg/dL (ref 586–1602)
IgM (Immunoglobulin M), Srm: 90 mg/dL (ref 26–217)

## 2022-01-01 LAB — VITAMIN D 25 HYDROXY (VIT D DEFICIENCY, FRACTURES): Vit D, 25-Hydroxy: 22.4 ng/mL — ABNORMAL LOW (ref 30.0–100.0)

## 2022-01-01 NOTE — Telephone Encounter (Signed)
Can you ladies let her know that I asked Dr Epimenio Foot about the latest booster. He does not feel she needs to repeat the same booster. If a new booster comes out he does recommend updating at that time. TY!

## 2022-01-01 NOTE — Telephone Encounter (Signed)
-----   Message from Asa Lente, MD sent at 12/31/2021  5:15 PM EDT ----- It is uncertain if she would get much additional benefit by taking that booster again.  If new boosters are developed she can take them. ----- Message ----- From: Shawnie Dapper, NP Sent: 12/31/2021   4:29 PM EDT To: Asa Lente, MD  Are we still recommending every 6 month Covid boosters for MS patients on Ocrevus? Her pharmacist said there is not a new booster and to ask [place] if she needs to continue.

## 2022-01-01 NOTE — Telephone Encounter (Signed)
Called and spoke with pt. Relayed Amy's message. She verbalized understanding and appreciation. 

## 2022-01-07 ENCOUNTER — Telehealth: Payer: Self-pay | Admitting: Family Medicine

## 2022-01-07 NOTE — Telephone Encounter (Signed)
Auth: [id number] exp. 01/07/22-02/21/22 sent to [place] for scheduling

## 2022-01-29 ENCOUNTER — Other Ambulatory Visit: Payer: Self-pay | Admitting: Neurology

## 2022-02-18 ENCOUNTER — Encounter: Payer: Self-pay | Admitting: Family Medicine

## 2022-02-18 ENCOUNTER — Encounter: Payer: Self-pay | Admitting: Physician Assistant

## 2022-02-18 ENCOUNTER — Ambulatory Visit (INDEPENDENT_AMBULATORY_CARE_PROVIDER_SITE_OTHER): Payer: 59 | Admitting: Physician Assistant

## 2022-02-18 ENCOUNTER — Ambulatory Visit: Payer: Self-pay | Admitting: *Deleted

## 2022-02-18 VITALS — BP 123/77 | HR 59 | Temp 97.5°F | Wt 315.0 lb

## 2022-02-18 DIAGNOSIS — R0602 Shortness of breath: Secondary | ICD-10-CM | POA: Diagnosis not present

## 2022-02-18 DIAGNOSIS — F418 Other specified anxiety disorders: Secondary | ICD-10-CM | POA: Diagnosis not present

## 2022-02-18 MED ORDER — BUSPIRONE HCL 7.5 MG PO TABS
7.5000 mg | ORAL_TABLET | Freq: Two times a day (BID) | ORAL | 0 refills | Status: AC
Start: 2022-02-18 — End: 2022-03-20

## 2022-02-18 NOTE — Progress Notes (Signed)
Acute Office Visit   Patient: Jody Lee   DOB: 1970-11-06   51 y.o. Female  MRN: [id number] Visit Date: 02/18/2022  Today's healthcare provider: Oswaldo Conroy Terik Haughey, PA-C  Introduced myself to the patient as a Secondary school teacher and provided education on APPs in clinical practice.    Chief Complaint  Patient presents with   Shortness of Breath   Dizziness   Subjective    HPI   Reports she started having an panicky feeling with palpitations on Monday- continued with her day and felt better toward the end of the day Yesterday she states around lunch time she started having dizziness, took a few moments and this resolved  Last night noted some SOB and light cramping sensation in calves- went to sleep without issue States this morning she started feeling SOB while she was getting ready Reports SOB does not seem to be exacerbated by exertion and has been hitting even when she is laying in bed ready for sleep.   States she is unsure if this is anxiety related  Took an At home COVID test - negative  States she has chronic anemia so she is often cold  Reports she is trying to change her diet and is trying to lose weight - low carb, higher protein, less sugary foods.   She reports chronic, infrequent palpitations from her heart murmur but these usually resolve with deep breathing and relaxing.      02/18/2022   10:41 AM 09/08/2021    9:26 AM 06/03/2021    1:14 PM 06/07/2019   11:28 AM 06/07/2019   11:27 AM  Depression screen PHQ 2/9  Decreased Interest 0 0 0 0 0  Down, Depressed, Hopeless 0 0 0 0 0  PHQ - 2 Score 0 0 0 0 0  Altered sleeping 1 0     Tired, decreased energy 1 0     Change in appetite 0 1     Feeling bad or failure about yourself  0 0     Trouble concentrating 0 0     Moving slowly or fidgety/restless 0 0     Suicidal thoughts 0 0     PHQ-9 Score 2 1     Difficult doing work/chores Not difficult at all Not difficult at all         02/18/2022   10:41 AM 09/08/2021     9:27 AM 09/13/2018   10:16 AM  GAD 7 : Generalized Anxiety Score  Nervous, Anxious, on Edge 1 1 0  Control/stop worrying 1 1 0  Worry too much - different things 1 1 1   Trouble relaxing 1 1 1   Restless 0 1 1  Easily annoyed or irritable 1 1 1   Afraid - awful might happen 0 0 0  Total GAD 7 Score 5 6 4   Anxiety Difficulty  Not difficult at all Not difficult at all       Medications: Outpatient Medications Prior to Visit  Medication Sig   acetaminophen (TYLENOL) 325 MG tablet Take 650 mg by mouth every 4 (four) hours as needed.   Armodafinil 250 MG tablet Take 1 tablet (250 mg total) by mouth daily.   baclofen (LIORESAL) 10 MG tablet TAKE 1 TABLET(10 MG) BY MOUTH THREE TIMES DAILY (Patient taking differently: Take 10 mg by mouth 3 (three) times daily as needed. TAKE 1 TABLET(10 MG) BY MOUTH THREE TIMES DAILY)   Cholecalciferol (VITAMIN D3) 75 MCG (3000 UT)  TABS Take 3,000 Units by mouth daily.   fexofenadine (ALLEGRA) 180 MG tablet Take 180 mg by mouth daily.   gabapentin (NEURONTIN) 100 MG capsule TAKE 1 CAPSULE(100 MG) BY MOUTH THREE TIMES DAILY (Patient taking differently: Take 100 mg by mouth 3 (three) times daily as needed.)   Multiple Vitamin (MULTIVITAMIN) tablet Take 1 tablet by mouth daily.   ocrelizumab 600 mg in sodium chloride 0.9 % 500 mL Inject 600 mg into the vein once.   traMADol (ULTRAM) 50 MG tablet 1 pill po bid prn   Facility-Administered Medications Prior to Visit  Medication Dose Route Frequency Provider   gadopentetate dimeglumine (MAGNEVIST) injection 20 mL  20 mL Intravenous Once PRN Sater, Pearletha Furl, MD    Review of Systems  Constitutional:  Positive for fatigue. Negative for chills and fever.  Eyes:  Negative for visual disturbance.  Respiratory:  Positive for chest tightness and shortness of breath. Negative for cough and wheezing.   Cardiovascular:  Positive for palpitations and leg swelling. Negative for chest pain.  Neurological:  Positive for  dizziness and headaches (pressure along occipital area).  Psychiatric/Behavioral:  The patient is nervous/anxious.        Objective    BP 123/77 (BP Location: Left Wrist, Patient Position: Sitting, Cuff Size: Small)   Pulse (!) 59   Temp (!) 97.5 F (36.4 C) (Temporal)   Wt (!) 315 lb (142.9 kg)   SpO2 99%   BMI 54.07 kg/m    Physical Exam Vitals reviewed.  Constitutional:      General: She is awake.     Appearance: Normal appearance. She is well-developed and well-groomed. She is morbidly obese.  Cardiovascular:     Rate and Rhythm: Normal rate and regular rhythm.     Pulses: Normal pulses.          Radial pulses are 2+ on the right side and 2+ on the left side.     Heart sounds: Heart sounds are distant. No murmur heard.    No friction rub. No gallop.     Comments: Right calf circumference: 43 cm Left calf circumference 43 cm   No redness, asymmetry or hardness noted in lower extremities.  Pulmonary:     Effort: Pulmonary effort is normal.     Breath sounds: Normal breath sounds. No decreased breath sounds, wheezing, rhonchi or rales.  Musculoskeletal:     Right lower leg: 1+ Edema present.     Left lower leg: 1+ Edema present.  Neurological:     Mental Status: She is alert.  Psychiatric:        Attention and Perception: Attention normal.        Mood and Affect: Mood and affect normal.        Speech: Speech normal.        Behavior: Behavior normal. Behavior is cooperative.       No results found for any visits on 02/18/22.  Assessment & Plan      No follow-ups on file.     Problem List Items Addressed This Visit       Other   Depression with anxiety    Chronic, recurrent Patient reports daily stressors related to work and home life Reports she has a hx of depression and some intermittent anxiety - most severe when her mother passed away 4 years ago States she was taking ecitalopram? For this but seems unsure about restarting an  antidepressant Discussed options to assist with potential current anxiety- Buspar, Hydroxyzine,  SSRI/SNRI  We decided to try Buspar to assist with potential anxiety symptoms  Recommend follow up in 6 weeks to discuss response and progress.        Relevant Medications   busPIRone (BUSPAR) 7.5 MG tablet   Other Visit Diagnoses     SOB (shortness of breath)    -  Primary Acute, recurring concern over the last few days Reports intermittent episodes of SOB and dizziness- does not appear to be related to orthostatics, exertion  She reports dietary changes recently along with daily stress - potential for electrolyte etiology or anxiety  Will check CBC, CMP, iron and b12 for rule out as she states she is chronically anemic Based on exam and lack of infectious type symptoms, will forgo CXR for now  Suspect anxiety is contributory but lab results to help guide further management Will start Buspar to assist with anxiety  Follow up as needed for SOB Reviewed ED and return precautions- pt verbalized understanding    Relevant Orders   CBC w/Diff/Platelet   COMPLETE METABOLIC PANEL WITH GFR   B12 and Folate Panel   Iron, TIBC and Ferritin Panel        Return in about 6 weeks (around 04/01/2022) for anxiety f/u.   I, Elga Santy E Jurnie Garritano, PA-C, have reviewed all documentation for this visit. The documentation on 02/18/22 for the exam, diagnosis, procedures, and orders are all accurate and complete.   Jacquelin Hawking, MHS, PA-C [facility]

## 2022-02-18 NOTE — Assessment & Plan Note (Signed)
Chronic, recurrent Patient reports daily stressors related to work and home life Reports she has a hx of depression and some intermittent anxiety - most severe when her mother passed away 4 years ago States she was taking ecitalopram? For this but seems unsure about restarting an antidepressant Discussed options to assist with potential current anxiety- Buspar, Hydroxyzine, SSRI/SNRI  We decided to try Buspar to assist with potential anxiety symptoms  Recommend follow up in 6 weeks to discuss response and progress.

## 2022-02-18 NOTE — Patient Instructions (Addendum)
To help with the swelling in your legs I recommend using compression stockings Measure around the largest part of your calf and use your shoe size to assist with getting the correct size. I recommend using compression stockings with 15-25 mmHg compression rating to assist with daily swelling  I recommend standing and moving during the day, at least every 1 to 2 hours to help with swelling and circulation    I am starting Buspar 7.5 mg to be taken by mouth twice per day  This can help with the anxiety and potentially improve the shortness of breath   If you notice any of the following please go to the ED for evaluation: shortness of breath with fever, confusion, low blood pressure, dizziness, sustained shortness of breath that is not improving with relaxation or time, swelling of your legs

## 2022-02-18 NOTE — Telephone Encounter (Signed)
  Chief Complaint: SOB Symptoms: SOB off and on Frequency: for two days Pertinent Negatives: Patient denies asthma, cold symptoms Disposition: [] ED /[] Urgent Care (no appt availability in office) / [x] Appointment(In office/virtual)/ []  [place] [facility]/ [] Home Care/ [] Refused Recommended Disposition /[] [place] [facility]/ []  Follow-up with PCP Additional Notes: SOB with little exertion, some light headedness, no cold symptoms. Appt this morning.  Reason for Disposition  [1] MILD difficulty breathing (e.g., minimal/no SOB at rest, SOB with walking, pulse <100) AND [2] NEW-onset or WORSE than normal  Answer Assessment - Initial Assessment Questions 1. RESPIRATORY STATUS: "Describe your breathing?" (e.g., wheezing, shortness of breath, unable to speak, severe coughing)      No comes and goes with little effort, off and on 2. ONSET: "When did this breathing problem begin?"      2 days ago 3. PATTERN "Does the difficult breathing come and go, or has it been constant since it started?"      Comes and goes 4. SEVERITY: "How bad is your breathing?" (e.g., mild, moderate, severe)    - MILD: No SOB at rest, mild SOB with walking, speaks normally in sentences, can lie down, no retractions, pulse < 100.    - MODERATE: SOB at rest, SOB with minimal exertion and prefers to sit, cannot lie down flat, speaks in phrases, mild retractions, audible wheezing, pulse 100-120.    - SEVERE: Very SOB at rest, speaks in single words, struggling to breathe, sitting hunched forward, retractions, pulse > 120      mild 5. RECURRENT SYMPTOM: "Have you had difficulty breathing before?" If Yes, ask: "When was the last time?" and "What happened that time?"      Not for this long 6. CARDIAC HISTORY: "Do you have any history of heart disease?" (e.g., heart attack, angina, bypass surgery, angioplasty)      Mild murmur, thickening of one ventricle  7. LUNG HISTORY: "Do you have any history of lung disease?"  (e.g.,  pulmonary embolus, asthma, emphysema)     no 8. CAUSE: "What do you think is causing the breathing problem?"      unknown 9. OTHER SYMPTOMS: "Do you have any other symptoms? (e.g., dizziness, runny nose, cough, chest pain, fever)     no 10. O2 SATURATION MONITOR:  "Do you use an oxygen saturation monitor (pulse oximeter) at home?" If Yes, "What is your reading (oxygen level) today?" "What is your usual oxygen saturation reading?" (e.g., 95%)       no 11. PREGNANCY: "Is there any chance you are pregnant?" "When was your last menstrual period?"       no 12. TRAVEL: "Have you traveled out of the country in the last month?" (e.g., travel history, exposures)       no  Protocols used: Breathing Difficulty-A-AH

## 2022-02-19 ENCOUNTER — Telehealth: Payer: Self-pay

## 2022-02-19 LAB — CBC WITH DIFFERENTIAL/PLATELET
Absolute Monocytes: 659 cells/uL (ref 200–950)
Basophils Absolute: 32 cells/uL (ref 0–200)
Basophils Relative: 0.6 %
Eosinophils Absolute: 0 cells/uL — ABNORMAL LOW (ref 15–500)
Eosinophils Relative: 0 %
HCT: 39.8 % (ref 35.0–45.0)
Hemoglobin: 12.3 g/dL (ref 11.7–15.5)
Lymphs Abs: 934 cells/uL (ref 850–3900)
MCH: 26.3 pg — ABNORMAL LOW (ref 27.0–33.0)
MCHC: 30.9 g/dL — ABNORMAL LOW (ref 32.0–36.0)
MCV: 85 fL (ref 80.0–100.0)
MPV: 11 fL (ref 7.5–12.5)
Monocytes Relative: 12.2 %
Neutro Abs: 3775 cells/uL (ref 1500–7800)
Neutrophils Relative %: 69.9 %
Platelets: 275 10*3/uL (ref 140–400)
RBC: 4.68 10*6/uL (ref 3.80–5.10)
RDW: 14.1 % (ref 11.0–15.0)
Total Lymphocyte: 17.3 %
WBC: 5.4 10*3/uL (ref 3.8–10.8)

## 2022-02-19 LAB — COMPLETE METABOLIC PANEL WITH GFR
AG Ratio: 1.8 (calc) (ref 1.0–2.5)
ALT: 13 U/L (ref 6–29)
AST: 16 U/L (ref 10–35)
Albumin: 4 g/dL (ref 3.6–5.1)
Alkaline phosphatase (APISO): 119 U/L (ref 37–153)
BUN/Creatinine Ratio: 18 (calc) (ref 6–22)
BUN: 19 mg/dL (ref 7–25)
CO2: 27 mmol/L (ref 20–32)
Calcium: 9.2 mg/dL (ref 8.6–10.4)
Chloride: 105 mmol/L (ref 98–110)
Creat: 1.08 mg/dL — ABNORMAL HIGH (ref 0.50–1.03)
Globulin: 2.2 g/dL (calc) (ref 1.9–3.7)
Glucose, Bld: 89 mg/dL (ref 65–139)
Potassium: 4.5 mmol/L (ref 3.5–5.3)
Sodium: 139 mmol/L (ref 135–146)
Total Bilirubin: 0.4 mg/dL (ref 0.2–1.2)
Total Protein: 6.2 g/dL (ref 6.1–8.1)
eGFR: 63 mL/min/{1.73_m2} (ref >=60)

## 2022-02-19 LAB — IRON,TIBC AND FERRITIN PANEL
%SAT: 12 % (calc) — ABNORMAL LOW (ref 16–45)
Ferritin: 12 ng/mL — ABNORMAL LOW (ref 16–232)
Iron: 48 ug/dL (ref 45–160)
TIBC: 393 mcg/dL (calc) (ref 250–450)

## 2022-02-19 LAB — B12 AND FOLATE PANEL
Folate: 8.7 ng/mL
Vitamin B-12: 304 pg/mL (ref 200–1100)

## 2022-02-19 NOTE — Telephone Encounter (Signed)
Pt given lab results per notes of Denny Peon, [place] on 02/19/22. Pt verbalized understanding.

## 2022-02-21 ENCOUNTER — Ambulatory Visit
Admission: RE | Admit: 2022-02-21 | Discharge: 2022-02-21 | Disposition: A | Discharge status: Home / Self Care | Payer: 59 | Source: Ambulatory Visit | Attending: Family Medicine | Admitting: Family Medicine

## 2022-02-21 DIAGNOSIS — G35 Multiple sclerosis: Secondary | ICD-10-CM | POA: Insufficient documentation

## 2022-02-21 MED ORDER — GADOBUTROL 1 MMOL/ML IV SOLN
10.0000 mL | Freq: Once | INTRAVENOUS | Status: AC | PRN
  Administered 2022-02-21: 10 mL via INTRAVENOUS

## 2022-03-17 ENCOUNTER — Other Ambulatory Visit: Payer: Self-pay | Admitting: Physician Assistant

## 2022-03-17 DIAGNOSIS — F418 Other specified anxiety disorders: Secondary | ICD-10-CM

## 2022-03-17 NOTE — Telephone Encounter (Signed)
Requested medication (s) are due for refill today: yes  Requested medication (s) are on the active medication list: yes  Last refill:  02/18/22 ends 03/20/22  Future visit scheduled: no  Notes to clinic:  med ends 03/20/22   Requested Prescriptions  Pending Prescriptions Disp Refills   busPIRone (BUSPAR) 7.5 MG tablet [Pharmacy Med Name: busPIRone HCL 7.5 MG TABLET] 60 tablet 0    Sig: TAKE 1 TABLET BY MOUTH TWICE A DAY     Psychiatry: Anxiolytics/Hypnotics - Non-controlled Passed - 03/17/2022  6:23 AM      Passed - Valid encounter within last 12 months    Recent Outpatient Visits           3 weeks ago SOB (shortness of breath)   [facility] [place], Oswaldo Conroy, PA-C   6 months ago Post-COVID chronic cough   [facility] [place], Netta Neat, DO   6 months ago Nasal congestion   [facility] [place], Salvadore Oxford, NP   9 months ago Generalized weakness   [facility] [place], Salvadore Oxford, NP   1 year ago Acute non-recurrent frontal sinusitis   [facility] [place], Netta Neat, DO

## 2022-06-01 ENCOUNTER — Ambulatory Visit (INDEPENDENT_AMBULATORY_CARE_PROVIDER_SITE_OTHER): Payer: 59 | Admitting: Physician Assistant

## 2022-06-01 ENCOUNTER — Ambulatory Visit: Payer: Self-pay

## 2022-06-01 ENCOUNTER — Encounter: Payer: Self-pay | Admitting: Physician Assistant

## 2022-06-01 VITALS — BP 126/82 | HR 53 | Temp 96.0°F | Wt 311.0 lb

## 2022-06-01 DIAGNOSIS — R1030 Lower abdominal pain, unspecified: Secondary | ICD-10-CM | POA: Insufficient documentation

## 2022-06-01 LAB — POCT URINALYSIS DIPSTICK
Bilirubin, UA: NEGATIVE
Blood, UA: NEGATIVE
Glucose, UA: NEGATIVE
Ketones, UA: NEGATIVE
Leukocytes, UA: NEGATIVE
Nitrite, UA: NEGATIVE
Protein, UA: NEGATIVE
Spec Grav, UA: 1.015 (ref 1.010–1.025)
Urobilinogen, UA: 0.2 E.U./dL
pH, UA: 6 (ref 5.0–8.0)

## 2022-06-01 NOTE — Patient Instructions (Signed)
Please stay well hydrated and avoid holding your urine I am placing an order for a CT scan to check for kidney stones  Someone should call you soon to set this apt up - please let us know if you have not received a call by the end of the week  Please let us know if the pain and cramping becomes worse or if you start having bowel or urinary symptoms, fever, chills.

## 2022-06-01 NOTE — Telephone Encounter (Signed)
  Chief Complaint: Frequent urination - flank pain Symptoms: ibid Frequency: last week or so - pain intensified on Friday Pertinent Negatives: Patient denies Fever Disposition: [] ED /[] Urgent Care (no appt availability in office) / [x] Appointment(In office/virtual)/ []  [place] [facility]/ [] Home Care/ [] Refused Recommended Disposition /[] [place] [facility]/ []  Follow-up with PCP Additional Notes: Pt states she has had this pain for about 2 weeks. Pain has gotten much worse on Friday. Frequent urination, no fever. Flank pain.    Reason for Disposition  Side (flank) or lower back pain present  Answer Assessment - Initial Assessment Questions 1. SYMPTOM: "What's the main symptom you're concerned about?" (e.g., frequency, incontinence)     Frequency, Back pain, No pain with urination 2. ONSET: "When did the  pain  start?"     Friday - last week or 2 3. PAIN: "Is there any pain?" If Yes, ask: "How bad is it?" (Scale: 1-10; mild, moderate, severe)     4/10 4. CAUSE: "What do you think is causing the symptoms?"     UTI 5. OTHER SYMPTOMS: "Do you have any other symptoms?" (e.g., blood in urine, fever, flank pain, pain with urination)     Flank 6. PREGNANCY: "Is there any chance you are pregnant?" "When was your last menstrual period?"     NO  Protocols used: Urinary Symptoms-A-AH

## 2022-06-01 NOTE — Assessment & Plan Note (Signed)
Acute, new concern Reports intermittent left flank pain and low back pain along with bilateral lower abdominal pain that has become more consistent over the past few days UA was negative but will send for culture for definitive UTI rule out Recommend CT renal study to rule out kidney stone - patient is amenable to this  Differential includes but not limited to kidney stones, diverticulitis, ovarian cyst, muscular pain and osteoarthritis of hip joint.  Results of imaging to dictate further management  Recommend OTC pain medication at this time Follow up as needed for persistent or progressing symptoms

## 2022-06-01 NOTE — Progress Notes (Signed)
Acute Office Visit   Patient: Jody Lee   DOB: 1971-06-28   51 y.o. Female  MRN: [id number] Visit Date: 06/01/2022  Today's healthcare provider: Dani Gobble Delila Kuklinski, PA-C  Introduced myself to the patient as a Journalist, newspaper and provided education on APPs in clinical practice.    Chief Complaint  Patient presents with   Flank Pain   Subjective    HPI   Reports she is having lower abdominal pain and cramping, intermittent for the past two weeks but feels more consistent since Friday Pain level: 4-5/10 right now, nagging - responds to OTC pain medications Reports some left-sided low back/ flank pain- reports that this is consistent for the past two weeks- intensified with bending to put on sock and shoes She denies pain with urination, denies gross hematuria She denies vaginal pain She denies previous hx of kidney stones LMP was around 2018 Interventions: Tylenol,   Medications: Outpatient Medications Prior to Visit  Medication Sig   acetaminophen (TYLENOL) 325 MG tablet Take 650 mg by mouth every 4 (four) hours as needed.   Armodafinil 250 MG tablet Take 1 tablet (250 mg total) by mouth daily.   baclofen (LIORESAL) 10 MG tablet TAKE 1 TABLET(10 MG) BY MOUTH THREE TIMES DAILY (Patient taking differently: Take 10 mg by mouth 3 (three) times daily as needed. TAKE 1 TABLET(10 MG) BY MOUTH THREE TIMES DAILY)   Cholecalciferol (VITAMIN D3) 75 MCG (3000 UT) TABS Take 3,000 Units by mouth daily.   fexofenadine (ALLEGRA) 180 MG tablet Take 180 mg by mouth daily.   gabapentin (NEURONTIN) 100 MG capsule TAKE 1 CAPSULE(100 MG) BY MOUTH THREE TIMES DAILY (Patient taking differently: Take 100 mg by mouth 3 (three) times daily as needed.)   ocrelizumab 600 mg in sodium chloride 0.9 % 500 mL Inject 600 mg into the vein once.   traMADol (ULTRAM) 50 MG tablet 1 pill po bid prn   Multiple Vitamin (MULTIVITAMIN) tablet Take 1 tablet by mouth daily.   Facility-Administered Medications Prior to Visit   Medication Dose Route Frequency Provider   gadopentetate dimeglumine (MAGNEVIST) injection 20 mL  20 mL Intravenous Once PRN Sater, Nanine Means, MD    Review of Systems  Constitutional:  Negative for chills, fatigue and fever.  Gastrointestinal:  Positive for abdominal pain. Negative for blood in stool, constipation, diarrhea, nausea and vomiting.  Genitourinary:  Positive for flank pain. Negative for decreased urine volume, difficulty urinating, dysuria, hematuria, urgency, vaginal bleeding, vaginal discharge and vaginal pain.  Skin:  Negative for rash and wound.        Objective    BP 126/82 (BP Location: Left Wrist, Patient Position: Sitting, Cuff Size: Normal)   Pulse (!) 53   Temp (!) 96 F (35.6 C) (Temporal)   Wt (!) 311 lb (141.1 kg)   SpO2 95%   BMI 53.38 kg/m     Physical Exam Vitals reviewed.  Constitutional:      General: She is awake.     Appearance: Normal appearance. She is well-developed and well-groomed.  HENT:     Head: Normocephalic and atraumatic.  Eyes:     General: Lids are normal. Gaze aligned appropriately.     Extraocular Movements: Extraocular movements intact.     Conjunctiva/sclera: Conjunctivae normal.  Pulmonary:     Effort: Pulmonary effort is normal.  Abdominal:     General: Abdomen is protuberant. Bowel sounds are normal.     Palpations: Abdomen is  soft.     Tenderness: There is no abdominal tenderness. There is no right CVA tenderness or left CVA tenderness.  Neurological:     General: No focal deficit present.     Mental Status: She is alert and oriented to person, place, and time.     GCS: GCS eye subscore is 4. GCS verbal subscore is 5. GCS motor subscore is 6.     Cranial Nerves: No dysarthria or facial asymmetry.     Gait: Gait is intact.  Psychiatric:        Attention and Perception: Attention and perception normal.        Mood and Affect: Mood and affect normal.        Speech: Speech normal.        Behavior: Behavior normal.  Behavior is cooperative.       No results found for any visits on 06/01/22.  Assessment & Plan      No follow-ups on file.      Problem List Items Addressed This Visit       Other   Lower abdominal pain - Primary    Acute, new concern Reports intermittent left flank pain and low back pain along with bilateral lower abdominal pain that has become more consistent over the past few days UA was negative but will send for culture for definitive UTI rule out Recommend CT renal study to rule out kidney stone - patient is amenable to this  Differential includes but not limited to kidney stones, diverticulitis, ovarian cyst, muscular pain and osteoarthritis of hip joint.  Results of imaging to dictate further management  Recommend OTC pain medication at this time Follow up as needed for persistent or progressing symptoms        Relevant Orders   CT RENAL STONE STUDY     No follow-ups on file.   I, Toni Hoffmeister E Alexiss Iturralde, PA-C, have reviewed all documentation for this visit. The documentation on 06/01/22 for the exam, diagnosis, procedures, and orders are all accurate and complete.   Jody Lee, MHS, PA-C [facility]

## 2022-06-01 NOTE — Addendum Note (Signed)
Addended by: Ashley Royalty E on: 06/01/2022 04:26 PM   Modules accepted: Orders

## 2022-06-02 LAB — URINE CULTURE
MICRO NUMBER:: 14087999
Result:: NO GROWTH
SPECIMEN QUALITY:: ADEQUATE

## 2022-06-03 ENCOUNTER — Encounter: Payer: Self-pay | Admitting: Radiology

## 2022-06-03 ENCOUNTER — Ambulatory Visit
Admission: RE | Admit: 2022-06-03 | Discharge: 2022-06-03 | Disposition: A | Discharge status: Home / Self Care | Payer: 59 | Source: Ambulatory Visit | Attending: Physician Assistant | Admitting: Physician Assistant

## 2022-06-03 DIAGNOSIS — R1030 Lower abdominal pain, unspecified: Secondary | ICD-10-CM | POA: Insufficient documentation

## 2022-07-06 ENCOUNTER — Encounter: Payer: Self-pay | Admitting: Family Medicine

## 2022-07-06 ENCOUNTER — Ambulatory Visit (INDEPENDENT_AMBULATORY_CARE_PROVIDER_SITE_OTHER): Payer: 59 | Admitting: Family Medicine

## 2022-07-06 VITALS — BP 146/96 | HR 75 | Ht 64.0 in | Wt 312.0 lb

## 2022-07-06 DIAGNOSIS — Z79899 Other long term (current) drug therapy: Secondary | ICD-10-CM

## 2022-07-06 DIAGNOSIS — R269 Unspecified abnormalities of gait and mobility: Secondary | ICD-10-CM

## 2022-07-06 DIAGNOSIS — G4726 Circadian rhythm sleep disorder, shift work type: Secondary | ICD-10-CM

## 2022-07-06 DIAGNOSIS — R5383 Other fatigue: Secondary | ICD-10-CM | POA: Diagnosis not present

## 2022-07-06 DIAGNOSIS — G35 Multiple sclerosis: Secondary | ICD-10-CM | POA: Diagnosis not present

## 2022-07-06 DIAGNOSIS — E559 Vitamin D deficiency, unspecified: Secondary | ICD-10-CM

## 2022-07-06 MED ORDER — ARMODAFINIL 250 MG PO TABS: 250.0000 mg | ORAL_TABLET | Freq: Every day | ORAL | 1 refills | Status: DC

## 2022-07-06 NOTE — Patient Instructions (Signed)

## 2022-07-06 NOTE — Progress Notes (Signed)
Chief Complaint  Patient presents with   Follow-up    RM 11, alone. Ocrevus going well.    HISTORY OF PRESENT ILLNESS:  07/06/22 ALL:  Jody Lee is a 51 y.o. female here today for follow up for RRMS. She continues Ocrevus infusions. Last infusion 11/17/2021, next 05/18/2022. She is tolerating well. Last MRI 05/2019 was stable.   She feels that she is doing fairly well. No new or exacerbating symptoms. She does have some R>L foot drop and spastic gait. No falls or assistive devices. She takes baclofen as needed, usually 20mg  in morning.  She is prescribed gabapentin 100mg  TID as needed for dysesthesias but rarely uses.   She has noticed some weakness in grip strength, bilaterally. R>L. She wears carpal tunnel brace that helps.   Mood is good. She is sleeping well. 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 She does continues to have waxing and waning fatigue. We switched modafinil to armodafinil at last visit 12/2021. She feels that it is working better. She She gets an average of 4 hours of sleep. She may sleep longer on weekends. She has not been able to walk as much as she would like.   Tramadol 50mg  BID usually used about a month before her infusion when pain worsens. Has some MS hug, back and shoulder pain. She last refilled on 11/05/2021 for 14 tablets.    Vitamin D was 22.4 in 12/2021. We increased dose to 5000iu daily.    HISTORY (copied from Dr Bonnita Hollow previous note)  Jody Lee is a 51 y.o. woman with relapsing remitting MS diagnosed in 2005.      Update 07/02/2021: She is on Ocrevus and had her last infusion September, 2022.    IgG/IgM was normal 11/26/2020.  Vit D was 21.2 so she is back on supplements.  .       Her gait is stable with a mild right foot drop when she walks longer distance.    She has dysesthetic pain, especially in the mid back with a squeezing sensation.   She feels she did worse the last 2 weeks of her last  infusion cycle..   She takes tramadol when more painful.   Gabapentin and baclofen also help.     She has pain in the left hand and making a fist is harder.   She wears a brace.  Spasms are more panful at night in her legs.     She has bowel urgency but no incontinence.   No UTI.  Vision is stable.   She sees ophthalmology next week.      She had gained weight (+ 8 pounds since last visit, but greater than 80 pounds since her surgery).      Phentermine was not well tolerated (palpitations).   She had bariatric surgery (Roex en Y) in the past and had stable weight for a few years.   Unfortunately over the last 2 years she gained much of it back.  I did discuss medical weight loss referral with her and she will consider this.   She has fatigue, both physical and cognitive.  Marland Kitchen   She works (from home).   She sleeps well most nights but is sleeping less due to the hours.   She is tired during the day and takes naps .   Provigil helps sleepiness.      PSG in 11/2018 did not show OSA (AHI = 3.3)  She has cognitive issues with reduced focus/attention at times.        EPWORTH SLEEPINESS SCALE   On a scale of 0 - 3 what is the chance of dozing:   Sitting and Reading:                          1                                            Watching TV:                                      2 Sitting inactive in a public place:       0 Passenger in car for one hour:          0 Lying down to rest in the afternoon:  2 Sitting and talking to someone:         0 Sitting quietly after lunch:                  0 In a car, stopped in traffic:                 0   Total (out of 24):   5/24   Not sleepy.       MS History:   She was diagnosed with MS in 2005 after presenting with a spinal cord syndrome. She started with numbness and pain in the feet that worked its way up to her abdomen over a couple days.   Initially, she had an MRI of the spine that showed a plaque consistent with her symptoms. She was referred to Dr.  Maple Hudson. He did a lumbar puncture and ordered an MRI of the brain. Both were consistent with multiple sclerosis and I started to see her. Initially, she was placed on Betaseron and did very well for the first few years. Then around 2011 or 2012, she had an exacerbation and testing showed that she had developed antibodies against Betaseron. Therefore, she was switched to Gilenya at that time. She has been on Gilenya for the last 4+ years and has done well. Specifically, she has not reported any difficulties with exacerbations and she tolerates the medication well.   Her last MRI was performed at [facility] November 2015.  MRI of the brain from 03/02/2013 showed  typical periventricular T2/FLAIR hyperintense foci, predominantly in the periventricular white matter.   There were no acute foci.  MRI in 06/15/2017 showed a new enahncing lesion so she switched to Ocrevus  Late 2018.    MRI of the brain 05/15/2019 showed no new lesions.   MRI of the brain 04/17/2018 showed no new lesions.  Enhancement previously seen on the 06/15/2017 MRI had resolved.   MRI of the brain 06/15/2017 showed 1 enhancing lesion in the right periatrial white matter not present on the 2017 MRI.  The MRI was otherwise unchanged.   MRI of the cervical spine 06/17/2017 showed about 5 or 6 T2 hyperintense foci within the spinal cord located at C2, C2-C3, C4, C5, C6-C7 and T2-T3.  None of the foci were enhanced.  MRI was felt to be technically better than the 2017 MRI and some interval change could not be ruled  out.   Other: We did a PSG 11/2018 but she had only negligible OSA with an AHI = 3.3.   An optional oral appliance was discussed for her snoring.     REVIEW OF SYSTEMS: Out of a complete 14 system review of symptoms, the patient complains only of the following symptoms, foot drop, gait abnormality, numbness, tingling, fatigue, difficulty concentrating, and all other reviewed systems are negative.   ALLERGIES: Allergies   Allergen Reactions   Nsaids Other (See Comments)    Not supposed to take d/t bariatric surgery    Phentermine     Made her heart race   Ciprofloxacin Rash    Felt like heat/burning rash     HOME MEDICATIONS: Outpatient Medications Prior to Visit  Medication Sig Dispense Refill   acetaminophen (TYLENOL) 325 MG tablet Take 650 mg by mouth every 4 (four) hours as needed.     baclofen (LIORESAL) 10 MG tablet TAKE 1 TABLET(10 MG) BY MOUTH THREE TIMES DAILY (Patient taking differently: Take 10 mg by mouth 3 (three) times daily as needed. TAKE 1 TABLET(10 MG) BY MOUTH THREE TIMES DAILY) 270 tablet 3   Cholecalciferol (VITAMIN D3) 75 MCG (3000 UT) TABS Take 3,000 Units by mouth daily.     fexofenadine (ALLEGRA) 180 MG tablet Take 180 mg by mouth daily.     gabapentin (NEURONTIN) 100 MG capsule TAKE 1 CAPSULE(100 MG) BY MOUTH THREE TIMES DAILY (Patient taking differently: Take 100 mg by mouth 3 (three) times daily as needed.) 90 capsule 2   Multiple Vitamin (MULTIVITAMIN) tablet Take 1 tablet by mouth daily.     ocrelizumab 600 mg in sodium chloride 0.9 % 500 mL Inject 600 mg into the vein once.     traMADol (ULTRAM) 50 MG tablet 1 pill po bid prn 60 tablet 0   Armodafinil 250 MG tablet Take 1 tablet (250 mg total) by mouth daily. 90 tablet 1   Facility-Administered Medications Prior to Visit  Medication Dose Route Frequency Provider Last Rate Last Admin   gadopentetate dimeglumine (MAGNEVIST) injection 20 mL  20 mL Intravenous Once PRN Sater, Pearletha Furl, MD         PAST MEDICAL HISTORY: Past Medical History:  Diagnosis Date   Anemia    Hypertension    Menorrhagia    MS (multiple sclerosis) (HCC)    Shingles    right side of chest and back   Vision abnormalities    Vitamin B 12 deficiency    Vitamin D deficiency      PAST SURGICAL HISTORY: Past Surgical History:  Procedure Laterality Date   CHOLECYSTECTOMY     ESOPHAGOGASTRODUODENOSCOPY (EGD) WITH PROPOFOL N/A 12/18/2019    Procedure: ESOPHAGOGASTRODUODENOSCOPY (EGD) WITH PROPOFOL;  Surgeon: Pasty Spillers, MD;  Location: [facility] ENDOSCOPY;  Service: Endoscopy;  Laterality: N/A;   GASTRIC BYPASS       FAMILY HISTORY: Family History  Problem Relation Age of Onset   Stroke Mother    Heart disease Mother    Asthma Mother    Diabetes Mother    Hypertension Father    Heart disease Father    Diabetes Father    Asthma Brother    Alcoholism Brother    Obesity Brother    Heart disease Brother    Diabetes Maternal Grandmother    Lung cancer Maternal Grandfather    Breast cancer Neg Hx      SOCIAL HISTORY: Social History   Socioeconomic History   Marital status: Married  Spouse name: Not on file   Number of children: 1   Years of education: Not on file   Highest education level: Not on file  Occupational History   Occupation: Programmer  Tobacco Use   Smoking status: Never   Smokeless tobacco: Never  Vaping Use   Vaping Use: Never used  Substance and Sexual Activity   Alcohol use: No   Drug use: No   Sexual activity: Yes    Birth control/protection: Injection  Other Topics Concern   Not on file  Social History Narrative   Remarried in 2018. Has one daughter, one grandchild and 2 step children. Works from home   Social Determinants of Corporate investment banker Strain: Not on file  Food Insecurity: Not on file  Transportation Needs: Not on file  Physical Activity: Not on file  Stress: Not on file  Social Connections: Not on file  Intimate Partner Violence: Not on file      PHYSICAL EXAM  Vitals:   07/06/22 1313  BP: (!) 146/96  Pulse: 75  Weight: (!) 312 lb (141.5 kg)  Height: 5\' 4"  (1.626 m)     Body mass index is 53.55 kg/m.   Generalized: Well developed, in no acute distress  Cardiology: normal rate and rhythm, no murmur auscultated  Respiratory: clear to auscultation bilaterally    Neurological examination  Mentation: Alert oriented to time, place,  history taking. Follows all commands speech and language fluent Cranial nerve II-XII: Pupils were equal round reactive to light. Extraocular movements were full, visual field were full on confrontational test. Facial sensation and strength were normal. Head turning and shoulder shrug  were normal and symmetric. Motor: The motor testing reveals 5 over 5 strength of all 4 extremities. Good symmetric motor tone is noted throughout.  Sensory: Sensory testing is intact to soft touch on all 4 extremities. No evidence of extinction is noted.  Coordination: Cerebellar testing reveals good finger-nose-finger and heel-to-shin bilaterally.  Gait and station: Gait is normal. No foot drop noted  Reflexes: Deep tendon reflexes are symmetric and normal bilaterally.     DIAGNOSTIC DATA (LABS, IMAGING, TESTING) - I reviewed patient records, labs, notes, testing and imaging myself where available.  Lab Results  Component Value Date   WBC 5.4 02/18/2022   HGB 12.3 02/18/2022   HCT 39.8 02/18/2022   MCV 85.0 02/18/2022   PLT 275 02/18/2022      Component Value Date/Time   NA 139 02/18/2022 1049   NA 144 06/24/2017 1439   NA 139 06/27/2014 1356   K 4.5 02/18/2022 1049   K 3.9 06/27/2014 1356   CL 105 02/18/2022 1049   CL 106 06/27/2014 1356   CO2 27 02/18/2022 1049   CO2 28 06/27/2014 1356   GLUCOSE 89 02/18/2022 1049   GLUCOSE 89 06/27/2014 1356   BUN 19 02/18/2022 1049   BUN 12 06/24/2017 1439   BUN 7 06/27/2014 1356   CREATININE 1.08 (H) 02/18/2022 1049   CALCIUM 9.2 02/18/2022 1049   CALCIUM 8.3 (L) 06/27/2014 1356   PROT 6.2 02/18/2022 1049   PROT 6.6 05/16/2019 1626   PROT 6.9 06/27/2014 1356   ALBUMIN 4.3 05/16/2019 1626   ALBUMIN 3.8 06/27/2014 1356   AST 16 02/18/2022 1049   AST 15 06/27/2014 1356   ALT 13 02/18/2022 1049   ALT 20 06/27/2014 1356   ALKPHOS 141 (H) 11/01/2019 1359   ALKPHOS 105 06/27/2014 1356   BILITOT 0.4 02/18/2022 1049   BILITOT  0.3 05/16/2019 1626    BILITOT 0.4 06/27/2014 1356   GFRNONAA 43 (L) 01/17/2018 1453   GFRNONAA 77 10/15/2017 1034   GFRAA 50 (L) 01/17/2018 1453   GFRAA 89 10/15/2017 1034   No results found for: "CHOL", "HDL", "LDLCALC", "LDLDIRECT", "TRIG", "CHOLHDL" Lab Results  Component Value Date   HGBA1C 5.3 10/15/2017   Lab Results  Component Value Date   VITAMINB12 304 02/18/2022   Lab Results  Component Value Date   TSH 2.03 06/03/2021        No data to display               No data to display           ASSESSMENT AND PLAN  51 y.o. year old female  has a past medical history of Anemia, Hypertension, Menorrhagia, MS (multiple sclerosis) (HCC), Shingles, Vision abnormalities, Vitamin B 12 deficiency, and Vitamin D deficiency. here with   Relapsing remitting multiple sclerosis (HCC) - Plan: IgG, IgA, IgM, CBC with Differential/Platelets  High risk medication use - Plan: IgG, IgA, IgM, CBC with Differential/Platelets  Shifting sleep-work schedule - Plan: Armodafinil 250 MG tablet  Other fatigue  Vitamin D deficiency - Plan: Vitamin D, 25-hydroxy  Gait disturbance   Atlee is doing well, today. She will continue Ocrevus infusions. We will update labs. MRI stable 02/2022. She will continue baclofen, gabapentin, tramadol and vitamin D as directed. I will refill armodafinil for shift work disorder as it has helped with cognitive fog and MS fatigue. She will continue to wear wrist brace. Consider repeat cervical imaging in future. MR brain stable 02/2022. Healthy lifestyle habits encouraged. She will follow up with Dr Epimenio Foot in 6 months.    Orders Placed This Encounter  Procedures   IgG, IgA, IgM   CBC with Differential/Platelets   Vitamin D, 25-hydroxy     Meds ordered this encounter  Medications   Armodafinil 250 MG tablet    Sig: Take 1 tablet (250 mg total) by mouth daily.    Dispense:  90 tablet    Refill:  1    Order Specific Question:   Supervising Provider    Answer:   Anson Fret [[id number]]    Shawnie Dapper, MSN, FNP-C 07/06/2022, 2:11 PM  [facility] [address] [place], [place] [address] [phone]

## 2022-07-07 ENCOUNTER — Encounter: Payer: Self-pay | Admitting: Family Medicine

## 2022-07-07 ENCOUNTER — Other Ambulatory Visit: Payer: Self-pay | Admitting: Family Medicine

## 2022-07-07 LAB — CBC WITH DIFFERENTIAL/PLATELET
Basophils Absolute: 0 10*3/uL (ref 0.0–0.2)
Basos: 1 %
EOS (ABSOLUTE): 0 10*3/uL (ref 0.0–0.4)
Eos: 0 %
Hematocrit: 37.6 % (ref 34.0–46.6)
Hemoglobin: 12 g/dL (ref 11.1–15.9)
Immature Grans (Abs): 0 10*3/uL (ref 0.0–0.1)
Immature Granulocytes: 0 %
Lymphocytes Absolute: 1 10*3/uL (ref 0.7–3.1)
Lymphs: 21 %
MCH: 26.2 pg — ABNORMAL LOW (ref 26.6–33.0)
MCHC: 31.9 g/dL (ref 31.5–35.7)
MCV: 82 fL (ref 79–97)
Monocytes Absolute: 0.6 10*3/uL (ref 0.1–0.9)
Monocytes: 12 %
Neutrophils Absolute: 3.3 10*3/uL (ref 1.4–7.0)
Neutrophils: 66 %
Platelets: 264 10*3/uL (ref 150–450)
RBC: 4.58 x10E6/uL (ref 3.77–5.28)
RDW: 14.1 % (ref 11.7–15.4)
WBC: 4.9 10*3/uL (ref 3.4–10.8)

## 2022-07-07 LAB — IGG, IGA, IGM
IgA/Immunoglobulin A, Serum: 155 mg/dL (ref 87–352)
IgG (Immunoglobin G), Serum: 713 mg/dL (ref 586–1602)
IgM (Immunoglobulin M), Srm: 98 mg/dL (ref 26–217)

## 2022-07-07 LAB — VITAMIN D 25 HYDROXY (VIT D DEFICIENCY, FRACTURES): Vit D, 25-Hydroxy: 28.8 ng/mL — ABNORMAL LOW (ref 30.0–100.0)

## 2022-07-07 MED ORDER — VITAMIN D (ERGOCALCIFEROL) 1.25 MG (50000 UNIT) PO CAPS: 50000.0000 [IU] | ORAL_CAPSULE | ORAL | 0 refills | Status: DC

## 2022-07-16 ENCOUNTER — Telehealth: Payer: Self-pay | Admitting: *Deleted

## 2022-07-16 NOTE — Telephone Encounter (Signed)
Submitted PA armodafinil on covermymeds. Key: [id number]. Waiting on determination from [facility].

## 2022-07-16 NOTE — Telephone Encounter (Signed)
"  Request Reference Number: [id number]. ARMODAFINIL TAB 250MG  is approved through 01/15/2023. Your patient may now fill this prescription and it will be covered."

## 2022-08-05 ENCOUNTER — Ambulatory Visit (INDEPENDENT_AMBULATORY_CARE_PROVIDER_SITE_OTHER): Payer: 59 | Admitting: Podiatry

## 2022-08-05 ENCOUNTER — Encounter: Payer: Self-pay | Admitting: Podiatry

## 2022-08-05 DIAGNOSIS — B353 Tinea pedis: Secondary | ICD-10-CM | POA: Diagnosis not present

## 2022-08-05 DIAGNOSIS — L603 Nail dystrophy: Secondary | ICD-10-CM | POA: Diagnosis not present

## 2022-08-05 MED ORDER — KETOCONAZOLE 2 % EX CREA: 1.0000 | TOPICAL_CREAM | Freq: Two times a day (BID) | CUTANEOUS | 2 refills | Status: AC

## 2022-08-08 NOTE — Progress Notes (Signed)
  Subjective:  Patient ID: Jody Lee, female    DOB: [birth year],  MRN: [id number]  Chief Complaint  Patient presents with   Nail Problem    (np)bil thick toe nail, nail fungus   Tinea Pedis   Foot Pain    Patient has MS and attributes most of her foot and leg pain to that    51 y.o. female presents with the above complaint. History confirmed with patient.  She also notes dry scaling skin  Objective:  Physical Exam: warm, good capillary refill, no trophic changes or ulcerative lesions, normal DP and PT pulses, normal sensory exam, onychomycosis, and tinea pedis.  Assessment:   1. Nail dystrophy   2. Tinea pedis of both feet      Plan:  Patient was evaluated and treated and all questions answered.  Nail culture was taken to evaluate for onychomycosis.  We discussed etiology and treatment options of this.  I will message her with the results and discuss treatment options further  Discussed the etiology and treatment options for tinea pedis.  Discussed topical and oral treatment.  Recommended topical treatment with 2% ketoconazole cream.  This was sent to the patient's pharmacy.  Also discussed appropriate foot hygiene, use of antifungal spray such as Tinactin in shoes, as well as cleaning her foot surfaces such as showers and bathroom floors with bleach.   Return if symptoms worsen or fail to improve.

## 2022-09-07 ENCOUNTER — Telehealth: Payer: Self-pay | Admitting: *Deleted

## 2022-09-07 NOTE — Telephone Encounter (Signed)
Rx request for vitamin d level refilled denied, patient should be on OTC vitamin d.

## 2022-09-17 ENCOUNTER — Encounter: Payer: Self-pay | Admitting: Podiatry

## 2022-09-20 MED ORDER — TERBINAFINE HCL 250 MG PO TABS: 250.0000 mg | ORAL_TABLET | Freq: Every day | ORAL | 0 refills | Status: AC

## 2022-12-17 ENCOUNTER — Telehealth: Payer: Self-pay | Admitting: *Deleted

## 2022-12-17 NOTE — Telephone Encounter (Signed)
Armodafinil tablet 250 mg PA will expire on 01/15/23. [facility] sent request requesting initiating PA request.

## 2022-12-21 ENCOUNTER — Other Ambulatory Visit (HOSPITAL_COMMUNITY): Payer: Self-pay

## 2022-12-21 ENCOUNTER — Encounter: Payer: Self-pay | Admitting: Hematology and Oncology

## 2022-12-21 ENCOUNTER — Telehealth: Payer: Self-pay

## 2022-12-21 NOTE — Telephone Encounter (Signed)
A new telephone call encounter has been made for this PA Request-please see telephone note dated .Marland KitchenMarland Kitchen5/13/2024

## 2022-12-21 NOTE — Telephone Encounter (Signed)
Pharmacy Patient Advocate Encounter   Received notification from [employer] that prior authorization for Armodafinil 250MG  tablets is required/requested.   PA submitted on 12/21/2022 to (ins) [facility] via CoverMyMeds Key or (Medicaid) confirmation # [id number] Status is pending

## 2022-12-22 ENCOUNTER — Other Ambulatory Visit (HOSPITAL_COMMUNITY): Payer: Self-pay

## 2022-12-22 NOTE — Telephone Encounter (Signed)
Pharmacy Patient Advocate Encounter  Prior Authorization for Armodafinil 250MG  tablets has been approved by [facility] (ins).    PA # PA Case ID: [id number] Effective dates: 12/21/2022 through 06/23/2023

## 2023-01-05 ENCOUNTER — Encounter: Payer: Self-pay | Admitting: Neurology

## 2023-01-05 ENCOUNTER — Ambulatory Visit (INDEPENDENT_AMBULATORY_CARE_PROVIDER_SITE_OTHER): Payer: 59 | Admitting: Neurology

## 2023-01-05 VITALS — BP 132/91 | HR 70 | Ht 64.0 in | Wt 313.0 lb

## 2023-01-05 DIAGNOSIS — R269 Unspecified abnormalities of gait and mobility: Secondary | ICD-10-CM

## 2023-01-05 DIAGNOSIS — Z6841 Body Mass Index (BMI) 40.0 and over, adult: Secondary | ICD-10-CM

## 2023-01-05 DIAGNOSIS — E559 Vitamin D deficiency, unspecified: Secondary | ICD-10-CM

## 2023-01-05 DIAGNOSIS — Z79899 Other long term (current) drug therapy: Secondary | ICD-10-CM | POA: Diagnosis not present

## 2023-01-05 DIAGNOSIS — R5383 Other fatigue: Secondary | ICD-10-CM

## 2023-01-05 DIAGNOSIS — G35 Multiple sclerosis: Secondary | ICD-10-CM

## 2023-01-05 MED ORDER — BACLOFEN 10 MG PO TABS: ORAL_TABLET | ORAL | 3 refills | Status: AC

## 2023-01-05 NOTE — Progress Notes (Signed)
[facility]  PATIENT: Jody Lee DOB: 1971/05/30  REFERRING DOCTOR OR PCP:  Venora Maples SOURCE: patient and labs, notes in EMR and MRI on PACS ________________________________   HISTORICAL  CHIEF COMPLAINT:  Chief Complaint  Patient presents with   Follow-up    Pt in room 10. Here for MS follow up. Pt reports doing okay. Pt said it takes a lot to concentrate, takes armodafinil. Still has foot spasms and cramps not daily when on feet a lot, has at night at off/on. Noticed had strength is weaker turning and gripping things. Able to hold items fine.     HISTORY OF PRESENT ILLNESS:  Jody Lee is a 52 y.o. woman with relapsing remitting MS diagnosed in 2005.     Update 01/05/2023 She is on Ocrevus and had her last infusion November 16, 2022   She rarely has a little tickle in her throat but has never needed to stop an infusion.   IgG/IgM was normal 07/06/2022.   Vit D was 21.2 so she is back on 5000 U daily supplements.  .      Her gait is stable with a mild right foot drop when she walks longer distance.    She has dysesthetic pain, especially in the mid back with a squeezing sensation.   She feels she did worse the last 2 weeks of her last infusion cycle.Marland Kitchen      Spasms are panful at night in her legs.   She has cramps at times,   She has pain in the left hand and making a fist is harder.    She notes her grip is a little worse though typing is fine.    Weakness only in hands not higher up in upper arm or shoulders.   She takes tramadol when more painful.   Gabapentin and baclofen also help.      She has bowel urgency but no incontinence.   No UTI.    Vision is stable.   She sees ophthalmology next week.     She has had more stress with daughter's diagnosis of Sjogren's with renal failure and father's health issues.  She has started therapy for the stress.    She eats more when stressed.     She has cognitive issues with reduced focus/attention at times.     She  had gained weight back since the  bariatric surgery (Roex en Y) in the past.   She lost 140 pounds but gained 100 back.   Phentermine was not tolerated.     She has fatigue, both physical and cognitive.  Marland Kitchen   She works (from home).   She sleeps well most nights but is sleeping less due to the hours.   She is tired during the day and takes naps . She snores a little when on her back.    Provigil helps sleepiness.     PSG in 11/2018 did not show OSA (AHI = 3.3)      EPWORTH SLEEPINESS SCALE  On a scale of 0 - 3 what is the chance of dozing:  Sitting and Reading:  1    Watching TV:   2 Sitting inactive in a public place: 0 Passenger in car for one hour: 0 Lying down to rest in the afternoon: 2 Sitting and talking to someone: 0 Sitting quietly after lunch:  0 In a car, stopped in traffic:  0  Total (out of 24):   5/24   Not sleepy.  MS History:   She was diagnosed with MS in 2005 after presenting with a spinal cord syndrome. She started with numbness and pain in the feet that worked its way up to her abdomen over a couple days.   Initially, she had an MRI of the spine that showed a plaque consistent with her symptoms. She was referred to Dr. Maple Hudson. He did a lumbar puncture and ordered an MRI of the brain. Both were consistent with multiple sclerosis and I started to see her. Initially, she was placed on Betaseron and did very well for the first few years. Then around 2011 or 2012, she had an exacerbation and testing showed that she had developed antibodies against Betaseron. Therefore, she was switched to Gilenya at that time. She has been on Gilenya for the last 4+ years and has done well. Specifically, she has not reported any difficulties with exacerbations and she tolerates the medication well.   Her last MRI was performed at [facility] November 2015.  MRI of the brain from 03/02/2013 showed  typical periventricular T2/FLAIR hyperintense foci, predominantly in the  periventricular white matter.   There were no acute foci.  MRI in 06/15/2017 showed a new enahncing lesion so she switched to Ocrevus  Late 2018.   MRI of the brain 05/15/2019 showed no new lesions.  MRI of the brain 04/17/2018 showed no new lesions.  Enhancement previously seen on the 06/15/2017 MRI had resolved.  MRI of the brain 06/15/2017 showed 1 enhancing lesion in the right periatrial white matter not present on the 2017 MRI.  The MRI was otherwise unchanged.  MRI of the cervical spine 06/17/2017 showed about 5 or 6 T2 hyperintense foci within the spinal cord located at C2, C2-C3, C4, C5, C6-C7 and T2-T3.  None of the foci were enhanced.  MRI was felt to be technically better than the 2017 MRI and some interval change could not be ruled out.  Other: We did a PSG 11/2018 but she had only negligible OSA with an AHI = 3.3.   An optional oral appliance was discussed for her snoring.    REVIEW OF SYSTEMS: Constitutional: No fevers, chills, sweats, or change in appetite.   She has fatigue. Eyes: No visual changes, double vision, eye pain Ear, nose and throat: No hearing loss, ear pain, nasal congestion, sore throat Cardiovascular: No chest pain, palpitations Respiratory:  No shortness of breath at rest or with exertion.   No wheezes GastrointestinaI: No nausea, vomiting, diarrhea, abdominal pain, fecal incontinence Genitourinary:  No dysuria, urinary retention or frequency.  No nocturia. Musculoskeletal:  No neck pain, back pain Integumentary: No rash, pruritus, skin lesions Neurological: as above Psychiatric: There is depression and anxiety.  She is tearful at times. Endocrine: No palpitations, diaphoresis, change in appetite, change in weigh or increased thirst Hematologic/Lymphatic:  No anemia, purpura, petechiae. Allergic/Immunologic: No itchy/runny eyes, nasal congestion, recent allergic reactions, rashes  ALLERGIES: Allergies  Allergen Reactions   Nsaids Other (See Comments)    Not  supposed to take d/t bariatric surgery    Phentermine     Made her heart race   Ciprofloxacin Rash    Felt like heat/burning rash    HOME MEDICATIONS:  Current Outpatient Medications:    acetaminophen (TYLENOL) 325 MG tablet, Take 650 mg by mouth every 4 (four) hours as needed., Disp: , Rfl:    Armodafinil 250 MG tablet, Take 1 tablet (250 mg total) by mouth daily., Disp: 90 tablet, Rfl: 1  baclofen (LIORESAL) 10 MG tablet, TAKE 1 TABLET(10 MG) BY MOUTH THREE TIMES DAILY (Patient taking differently: Take 10 mg by mouth 3 (three) times daily as needed. TAKE 1 TABLET(10 MG) BY MOUTH THREE TIMES DAILY), Disp: 270 tablet, Rfl: 3   fexofenadine (ALLEGRA) 180 MG tablet, Take 180 mg by mouth daily., Disp: , Rfl:    gabapentin (NEURONTIN) 100 MG capsule, TAKE 1 CAPSULE(100 MG) BY MOUTH THREE TIMES DAILY (Patient taking differently: Take 100 mg by mouth 3 (three) times daily as needed.), Disp: 90 capsule, Rfl: 2   Multiple Vitamin (MULTIVITAMIN) tablet, Take 1 tablet by mouth daily., Disp: , Rfl:    ocrelizumab 600 mg in sodium chloride 0.9 % 500 mL, Inject 600 mg into the vein once., Disp: , Rfl:    traMADol (ULTRAM) 50 MG tablet, 1 pill po bid prn, Disp: 60 tablet, Rfl: 0   VITAMIN D, CHOLECALCIFEROL, PO, Take by mouth. Take 5,000 IU daily, Disp: , Rfl:    Cholecalciferol (VITAMIN D3) 75 MCG (3000 UT) TABS, Take 3,000 Units by mouth daily. (Patient not taking: Reported on 01/05/2023), Disp: , Rfl:    ketoconazole (NIZORAL) 2 % cream, Apply 1 Application topically 2 (two) times daily. (Patient not taking: Reported on 01/05/2023), Disp: 60 g, Rfl: 2 No current facility-administered medications for this visit.  Facility-Administered Medications Ordered in Other Visits:    gadopentetate dimeglumine (MAGNEVIST) injection 20 mL, 20 mL, Intravenous, Once PRN, Manreet Kiernan, Pearletha Furl, MD  PAST MEDICAL HISTORY: Past Medical History:  Diagnosis Date   Anemia    Hypertension    Menorrhagia    MS (multiple  sclerosis) (HCC)    Shingles    right side of chest and back   Vision abnormalities    Vitamin B 12 deficiency    Vitamin D deficiency     PAST SURGICAL HISTORY: Past Surgical History:  Procedure Laterality Date   CHOLECYSTECTOMY     ESOPHAGOGASTRODUODENOSCOPY (EGD) WITH PROPOFOL N/A 12/18/2019   Procedure: ESOPHAGOGASTRODUODENOSCOPY (EGD) WITH PROPOFOL;  Surgeon: Pasty Spillers, MD;  Location: [facility] ENDOSCOPY;  Service: Endoscopy;  Laterality: N/A;   GASTRIC BYPASS      FAMILY HISTORY: Family History  Problem Relation Age of Onset   Stroke Mother    Heart disease Mother    Asthma Mother    Diabetes Mother    Hypertension Father    Heart disease Father    Diabetes Father    Asthma Brother    Alcoholism Brother    Obesity Brother    Heart disease Brother    Diabetes Maternal Grandmother    Lung cancer Maternal Grandfather    Breast cancer Neg Hx     SOCIAL HISTORY:  Social History   Socioeconomic History   Marital status: Married    Spouse name: Not on file   Number of children: 1   Years of education: Not on file   Highest education level: Not on file  Occupational History   Occupation: Magazine features editor  Tobacco Use   Smoking status: Never   Smokeless tobacco: Never  Vaping Use   Vaping Use: Never used  Substance and Sexual Activity   Alcohol use: No   Drug use: No   Sexual activity: Yes    Birth control/protection: Injection  Other Topics Concern   Not on file  Social History Narrative   Remarried in 2018. Has one daughter, one grandchild and 2 step children. Works from home   Social Determinants of Corporate investment banker Strain:  Not on file  Food Insecurity: Not on file  Transportation Needs: Not on file  Physical Activity: Not on file  Stress: Not on file  Social Connections: Not on file  Intimate Partner Violence: Not on file     PHYSICAL EXAM  Vitals:   01/05/23 1245 01/05/23 1253  BP: (!) 144/81 (!) 132/91  Pulse: 69 70   Weight: (!) 313 lb (142 kg)   Height: 5\' 4"  (1.626 m)     Body mass index is 53.73 kg/m.   General: The patient is well-developed and well-nourished and in no acute distress    Neurologic Exam  Mental status: The patient is alert and oriented x 3 at the time of the examination. The patient has apparent normal recent and remote memory, with an apparently normal attention span and concentration ability.   Speech is normal.  Cranial nerves: Extraocular muscles are normal.  Facial strength and sensation is normal.. Hearing is normal and symmetric. Trapezius strength is normal.   Motor:  Muscle bulk is normal.   Muscle tone is normal.  Strength is 5/5 in the arms and legs.except 4++ left APB  Sensory: Intact sensation to touch and vibration in the arms and legs.   Mild Phalen's sign on left.  Coordination: Finger-nose-finger and heel-to-shin is performed well.    Gait and station: Station is normal.  Her gait is normal.  Tandem gait is mildly wide..  The Romberg was negative.    Reflexes: Deep tendon reflexes are symmetric and normal bilaterally.       Relapsing remitting multiple sclerosis (HCC)  High risk medication use  Other fatigue  Vitamin D deficiency  Gait disturbance  BMI 50.0-59.9, adult (HCC)   1.   Continue Ocrevus every 6 months.  Labs were fine 06/2022 Recheck lab work next visit (IgG/IgM and CBC/D) 2.   Continue Provigil for sleepiness/fatigue.  Tramadol for pain prn (usually just uses during the Ocrevus 'crap gap') 3.   Continue other medications.  Baclofen as needed 4.   Stay active and exercise as tolerated.  We discussed weight loss.  She has already had surgery but gained much of the weight back. 5.   Has history of vitamin B12 and D deficiencies but no recent supplements.   She is s/p gasttric bypass 6.   Return in 6 months or sooner if there are new or worsening neurologic symptoms.    Chinelo Benn A. Epimenio Foot, MD, PhD 01/05/2023, 1:38 PM Certified in  Neurology, Clinical Neurophysiology, Sleep Medicine, Pain Medicine and Neuroimaging  [facility] [address], [address] [place], [place] [address] [phone]

## 2023-02-17 ENCOUNTER — Other Ambulatory Visit: Payer: Self-pay

## 2023-02-17 ENCOUNTER — Encounter: Payer: Self-pay | Admitting: Family Medicine

## 2023-02-17 DIAGNOSIS — G4726 Circadian rhythm sleep disorder, shift work type: Secondary | ICD-10-CM

## 2023-02-17 MED ORDER — ARMODAFINIL 250 MG PO TABS: 250.0000 mg | ORAL_TABLET | Freq: Every day | ORAL | 0 refills | Status: DC

## 2023-02-17 MED ORDER — ARMODAFINIL 250 MG PO TABS: 250.0000 mg | ORAL_TABLET | Freq: Every day | ORAL | 1 refills | Status: DC

## 2023-02-17 NOTE — Telephone Encounter (Signed)
Requested Prescriptions   Pending Prescriptions Disp Refills   Armodafinil 250 MG tablet 90 tablet 0    Sig: Take 1 tablet (250 mg total) by mouth daily.   Last seen 01/05/23, next appt scheduled for 07/22/23 Dispenses  Routing to provider to fill Dispensed Days Supply Quantity Provider Pharmacy  ARMODAFINIL  250 MG TABS 12/08/2022 90 90 tablet Shawnie Dapper, NP [facility] - ...  ARMODAFINIL  250 MG TABS 07/21/2022 90 90 tablet Lomax, Amy, NP [facility] - .Marland KitchenMarland Kitchen

## 2023-04-28 ENCOUNTER — Other Ambulatory Visit: Payer: Self-pay | Admitting: Family Medicine

## 2023-05-04 ENCOUNTER — Other Ambulatory Visit: Payer: Self-pay

## 2023-05-04 MED ORDER — ARMODAFINIL 250 MG PO TABS: 250.0000 mg | ORAL_TABLET | Freq: Every day | ORAL | 0 refills | Status: DC

## 2023-05-05 ENCOUNTER — Encounter: Payer: Self-pay | Admitting: Internal Medicine

## 2023-05-10 ENCOUNTER — Encounter: Payer: Self-pay | Admitting: Internal Medicine

## 2023-05-10 ENCOUNTER — Telehealth (INDEPENDENT_AMBULATORY_CARE_PROVIDER_SITE_OTHER): Payer: 59 | Admitting: Internal Medicine

## 2023-05-10 DIAGNOSIS — N644 Mastodynia: Secondary | ICD-10-CM | POA: Diagnosis not present

## 2023-05-10 NOTE — Patient Instructions (Signed)
Breast Tenderness Breast tenderness is a common problem for women of all ages, but may also occur in men. Breast tenderness has many possible causes, including hormone changes, infections, taking certain medicines, and caffeine intake. In women, the pain usually comes and goes with the menstrual cycle, but it can also be constant. Breast tenderness may range from mild discomfort to severe pain. You may have tests, such as a mammogram or an ultrasound, to check for any unusual findings. Having breast tenderness usually does not mean that you have breast cancer. Follow these instructions at home: Managing pain and discomfort  If directed, put ice on the painful area. To do this: Put ice in a plastic bag. Place a towel between your skin and the bag. Leave the ice on for 20 minutes, 2-3 times a day. If your skin turns bright red, remove the ice right away to prevent skin damage. The risk of skin damage is higher if you cannot feel pain, heat, or cold. Wear a supportive bra or chest support: During exercise. While sleeping, if your breasts are very tender. Medicines Take over-the-counter and prescription medicines only as told by your health care provider. If the cause of your pain is an infection, you may be prescribed an antibiotic medicine. If you were prescribed antibiotics, take them as told by your health care provider. Do not stop using the antibiotic even if you start to feel better. Eating and drinking Decrease the amount of caffeine in your diet. Instead, drink more water and choose caffeine-free drinks. Your health care provider may recommend that you lessen the amount of fat in your diet. You can do this by: Limiting fried foods. Cooking foods using methods such as baking, boiling, grilling, and broiling. General instructions  Keep a log of the days and times when your breasts are most tender. Ask your health care provider how to do breast exams at home. This will help you notice if  you have an unusual growth or lump. Keep all follow-up visits. Contact a health care provider if: Any part of your breast is hard, red, and hot to the touch. This may be a sign of infection. You are a woman and have a new or painful lump in your breast that remains after your menstrual period ends. You are not breastfeeding and you have fluid, especially blood or pus, coming out of your nipples. You have a fever. Your pain does not improve or it gets worse. Your pain is interfering with your daily activities. Summary Breast tenderness may range from mild discomfort to severe pain. Breast tenderness has many possible causes, including hormone changes, infections, taking certain medicines, and caffeine intake. It can be treated with ice, wearing a supportive bra or chest support, and medicines. Make changes to your diet as told by your health care provider. This information is not intended to replace advice given to you by your health care provider. Make sure you discuss any questions you have with your health care provider. Document Revised: 10/08/2021 Document Reviewed: 10/08/2021 Elsevier Patient Education  2024 [employer].
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
## 2023-05-14 ENCOUNTER — Ambulatory Visit
Admission: RE | Admit: 2023-05-14 | Discharge: 2023-05-14 | Disposition: A | Discharge status: Home / Self Care | Payer: 59 | Source: Ambulatory Visit | Attending: Internal Medicine | Admitting: Internal Medicine

## 2023-05-14 DIAGNOSIS — N644 Mastodynia: Secondary | ICD-10-CM

## 2023-05-18 ENCOUNTER — Encounter: Payer: Self-pay | Admitting: Internal Medicine

## 2023-05-19 ENCOUNTER — Ambulatory Visit (INDEPENDENT_AMBULATORY_CARE_PROVIDER_SITE_OTHER): Payer: 59 | Admitting: Internal Medicine

## 2023-05-19 VITALS — BP 122/82 | HR 64 | Temp 95.5°F | Ht 64.0 in | Wt 312.0 lb

## 2023-05-19 DIAGNOSIS — R739 Hyperglycemia, unspecified: Secondary | ICD-10-CM

## 2023-05-19 DIAGNOSIS — G35 Multiple sclerosis: Secondary | ICD-10-CM

## 2023-05-19 DIAGNOSIS — Z23 Encounter for immunization: Secondary | ICD-10-CM | POA: Diagnosis not present

## 2023-05-19 DIAGNOSIS — Z0001 Encounter for general adult medical examination with abnormal findings: Secondary | ICD-10-CM

## 2023-05-19 DIAGNOSIS — Z136 Encounter for screening for cardiovascular disorders: Secondary | ICD-10-CM | POA: Diagnosis not present

## 2023-05-19 DIAGNOSIS — Z1211 Encounter for screening for malignant neoplasm of colon: Secondary | ICD-10-CM | POA: Diagnosis not present

## 2023-05-19 DIAGNOSIS — N951 Menopausal and female climacteric states: Secondary | ICD-10-CM

## 2023-05-19 DIAGNOSIS — Z1159 Encounter for screening for other viral diseases: Secondary | ICD-10-CM

## 2023-05-19 DIAGNOSIS — D508 Other iron deficiency anemias: Secondary | ICD-10-CM

## 2023-05-19 NOTE — Assessment & Plan Note (Addendum)
Encouraged diet and exercise for weight loss ?

## 2023-05-19 NOTE — Patient Instructions (Signed)
Health Maintenance for Postmenopausal Women Menopause is a normal process in which your ability to get pregnant comes to an end. This process happens slowly over many months or years, usually between the ages of 48 and 55. Menopause is complete when you have missed your menstrual period for 12 months. It is important to talk with your health care provider about some of the most common conditions that affect women after menopause (postmenopausal women). These include heart disease, cancer, and bone loss (osteoporosis). Adopting a healthy lifestyle and getting preventive care can help to promote your health and wellness. The actions you take can also lower your chances of developing some of these common conditions. What are the signs and symptoms of menopause? During menopause, you may have the following symptoms: Hot flashes. These can be moderate or severe. Night sweats. Decrease in sex drive. Mood swings. Headaches. Tiredness (fatigue). Irritability. Memory problems. Problems falling asleep or staying asleep. Talk with your health care provider about treatment options for your symptoms. Do I need hormone replacement therapy? Hormone replacement therapy is effective in treating symptoms that are caused by menopause, such as hot flashes and night sweats. Hormone replacement carries certain risks, especially as you become older. If you are thinking about using estrogen or estrogen with progestin, discuss the benefits and risks with your health care provider. How can I reduce my risk for heart disease and stroke? The risk of heart disease, heart attack, and stroke increases as you age. One of the causes may be a change in the body's hormones during menopause. This can affect how your body uses dietary fats, triglycerides, and cholesterol. Heart attack and stroke are medical emergencies. There are many things that you can do to help prevent heart disease and stroke. Watch your blood pressure High  blood pressure causes heart disease and increases the risk of stroke. This is more likely to develop in people who have high blood pressure readings or are overweight. Have your blood pressure checked: Every 3-5 years if you are 18-39 years of age. Every year if you are 40 years old or older. Eat a healthy diet  Eat a diet that includes plenty of vegetables, fruits, low-fat dairy products, and lean protein. Do not eat a lot of foods that are high in solid fats, added sugars, or sodium. Get regular exercise Get regular exercise. This is one of the most important things you can do for your health. Most adults should: Try to exercise for at least 150 minutes each week. The exercise should increase your heart rate and make you sweat (moderate-intensity exercise). Try to do strengthening exercises at least twice each week. Do these in addition to the moderate-intensity exercise. Spend less time sitting. Even light physical activity can be beneficial. Other tips Work with your health care provider to achieve or maintain a healthy weight. Do not use any products that contain nicotine or tobacco. These products include cigarettes, chewing tobacco, and vaping devices, such as e-cigarettes. If you need help quitting, ask your health care provider. Know your numbers. Ask your health care provider to check your cholesterol and your blood sugar (glucose). Continue to have your blood tested as directed by your health care provider. Do I need screening for cancer? Depending on your health history and family history, you may need to have cancer screenings at different stages of your life. This may include screening for: Breast cancer. Cervical cancer. Lung cancer. Colorectal cancer. What is my risk for osteoporosis? After menopause, you may be   at increased risk for osteoporosis. Osteoporosis is a condition in which bone destruction happens more quickly than new bone creation. To help prevent osteoporosis or  the bone fractures that can happen because of osteoporosis, you may take the following actions: If you are 19-50 years old, get at least 1,000 mg of calcium and at least 600 international units (IU) of vitamin D per day. If you are older than age 50 but younger than age 70, get at least 1,200 mg of calcium and at least 600 international units (IU) of vitamin D per day. If you are older than age 70, get at least 1,200 mg of calcium and at least 800 international units (IU) of vitamin D per day. Smoking and drinking excessive alcohol increase the risk of osteoporosis. Eat foods that are rich in calcium and vitamin D, and do weight-bearing exercises several times each week as directed by your health care provider. How does menopause affect my mental health? Depression may occur at any age, but it is more common as you become older. Common symptoms of depression include: Feeling depressed. Changes in sleep patterns. Changes in appetite or eating patterns. Feeling an overall lack of motivation or enjoyment of activities that you previously enjoyed. Frequent crying spells. Talk with your health care provider if you think that you are experiencing any of these symptoms. General instructions See your health care provider for regular wellness exams and vaccines. This may include: Scheduling regular health, dental, and eye exams. Getting and maintaining your vaccines. These include: Influenza vaccine. Get this vaccine each year before the flu season begins. Pneumonia vaccine. Shingles vaccine. Tetanus, diphtheria, and pertussis (Tdap) booster vaccine. Your health care provider may also recommend other immunizations. Tell your health care provider if you have ever been abused or do not feel safe at home. Summary Menopause is a normal process in which your ability to get pregnant comes to an end. This condition causes hot flashes, night sweats, decreased interest in sex, mood swings, headaches, or lack  of sleep. Treatment for this condition may include hormone replacement therapy. Take actions to keep yourself healthy, including exercising regularly, eating a healthy diet, watching your weight, and checking your blood pressure and blood sugar levels. Get screened for cancer and depression. Make sure that you are up to date with all your vaccines. This information is not intended to replace advice given to you by your health care provider. Make sure you discuss any questions you have with your health care provider. Document Revised: 12/16/2020 Document Reviewed: 12/16/2020 Elsevier Patient Education  2024 [employer] Inc.  

## 2023-05-19 NOTE — Progress Notes (Signed)
Subjective:    Patient ID: Jody Lee, female    DOB: 31-Mar-1971, 52 y.o.   MRN: [id number]  HPI  Patient presents to clinic today for her annual exam.  Flu: 05/2022 Tetanus: > 10 years ago COVID: X 3 Shingrix: Never Pap smear: 01/2017 Mammogram: 05/2023 Colon screening: never Vision screening: annually Dentist: dentures  Diet: She does eat meat. She consumes more fruits than veggies. She does eat some fried foods. She drinks mostly unsweet tea, soda Exercise: None    Review of Systems  Past Medical History:  Diagnosis Date   Anemia    Hypertension    Menorrhagia    MS (multiple sclerosis) (HCC)    Shingles    right side of chest and back   Vision abnormalities    Vitamin B 12 deficiency    Vitamin D deficiency     Current Outpatient Medications  Medication Sig Dispense Refill   acetaminophen (TYLENOL) 325 MG tablet Take 650 mg by mouth every 4 (four) hours as needed.     Armodafinil 250 MG tablet Take 1 tablet (250 mg total) by mouth daily. 90 tablet 0   baclofen (LIORESAL) 10 MG tablet TAKE 1 TABLET(10 MG) BY MOUTH THREE TIMES DAILY 270 tablet 3   Cholecalciferol (VITAMIN D3) 75 MCG (3000 UT) TABS Take 3,000 Units by mouth daily. (Patient not taking: Reported on 01/05/2023)     fexofenadine (ALLEGRA) 180 MG tablet Take 180 mg by mouth daily.     gabapentin (NEURONTIN) 100 MG capsule TAKE 1 CAPSULE(100 MG) BY MOUTH THREE TIMES DAILY (Patient taking differently: Take 100 mg by mouth 3 (three) times daily as needed.) 90 capsule 2   ketoconazole (NIZORAL) 2 % cream Apply 1 Application topically 2 (two) times daily. (Patient not taking: Reported on 01/05/2023) 60 g 2   Multiple Vitamin (MULTIVITAMIN) tablet Take 1 tablet by mouth daily.     ocrelizumab 600 mg in sodium chloride 0.9 % 500 mL Inject 600 mg into the vein once.     traMADol (ULTRAM) 50 MG tablet 1 pill po bid prn 60 tablet 0   VITAMIN D, CHOLECALCIFEROL, PO Take by mouth. Take 5,000 IU daily     No  current facility-administered medications for this visit.   Facility-Administered Medications Ordered in Other Visits  Medication Dose Route Frequency Provider Last Rate Last Admin   gadopentetate dimeglumine (MAGNEVIST) injection 20 mL  20 mL Intravenous Once PRN Sater, Pearletha Furl, MD        Allergies  Allergen Reactions   Nsaids Other (See Comments)    Not supposed to take d/t bariatric surgery    Phentermine     Made her heart race   Ciprofloxacin Rash    Felt like heat/burning rash    Family History  Problem Relation Age of Onset   Stroke Mother    Heart disease Mother    Asthma Mother    Diabetes Mother    Hypertension Father    Heart disease Father    Diabetes Father    Asthma Brother    Alcoholism Brother    Obesity Brother    Heart disease Brother    Diabetes Maternal Grandmother    Lung cancer Maternal Grandfather    Breast cancer Neg Hx     Social History   Socioeconomic History   Marital status: Married    Spouse name: Not on file   Number of children: 1   Years of education: Not on file  Highest education level: Some college, no degree  Occupational History   Occupation: Magazine features editor  Tobacco Use   Smoking status: Never   Smokeless tobacco: Never  Vaping Use   Vaping status: Never Used  Substance and Sexual Activity   Alcohol use: No   Drug use: No   Sexual activity: Yes    Birth control/protection: Injection  Other Topics Concern   Not on file  Social History Narrative   Remarried in 2018. Has one daughter, one grandchild and 2 step children. Works from home   Social Determinants of Corporate investment banker Strain: Low Risk  (05/19/2023)   Overall Financial Resource Strain (CARDIA)    Difficulty of Paying Living Expenses: Not hard at all  Food Insecurity: No Food Insecurity (05/19/2023)   Hunger Vital Sign    Worried About Running Out of Food in the Last Year: Never true    Ran Out of Food in the Last Year: Never true  Transportation  Needs: No Transportation Needs (05/19/2023)   PRAPARE - Administrator, Civil Service (Medical): No    Lack of Transportation (Non-Medical): No  Physical Activity: Insufficiently Active (05/19/2023)   Exercise Vital Sign    Days of Exercise per Week: 2 days    Minutes of Exercise per Session: 30 min  Stress: Stress Concern Present (05/19/2023)   [employer] of Occupational Health - Occupational Stress Questionnaire    Feeling of Stress : To some extent  Social Connections: Moderately Isolated (05/19/2023)   Social Connection and Isolation Panel [NHANES]    Frequency of Communication with Friends and Family: Three times a week    Frequency of Social Gatherings with Friends and Family: Once a week    Attends Religious Services: Never    Database administrator or Organizations: No    Attends Engineer, structural: Not on file    Marital Status: Married  Catering manager Violence: Not on file     Constitutional: Denies fever, malaise, fatigue, headache or abrupt weight changes.  HEENT: Denies eye pain, eye redness, ear pain, ringing in the ears, wax buildup, runny nose, nasal congestion, bloody nose, or sore throat. Respiratory: Denies difficulty breathing, shortness of breath, cough or sputum production.   Cardiovascular: Pt reports intermittent swelling in legs. Denies chest pain, chest tightness, palpitations or swelling in the hands.  Gastrointestinal: Pt reports constipation, intermittent reflux. Denies abdominal pain, bloating, diarrhea or blood in the stool.  GU: Denies urgency, frequency, pain with urination, burning sensation, blood in urine, odor or discharge. Musculoskeletal: Denies decrease in range of motion, difficulty with gait, muscle pain or joint pain and swelling.  Skin: Denies redness, rashes, lesions or ulcercations.  Neurological: Patient reports insomnia ,hot flashes, difficulty with memory.  Denies dizziness, difficulty with speech or problems  with balance and coordination.  Psych: Patient has a history of anxiety and depression.  Denies SI/HI.  No other specific complaints in a complete review of systems (except as listed in HPI above).     Objective:   Physical Exam   BP 122/82 (BP Location: Left Wrist, Patient Position: Sitting, Cuff Size: Normal)   Pulse 64   Temp (!) 95.5 F (35.3 C) (Temporal)   Ht 5\' 4"  (1.626 m)   Wt (!) 312 lb (141.5 kg)   SpO2 98%   BMI 53.55 kg/m   Wt Readings from Last 3 Encounters:  01/05/23 (!) 313 lb (142 kg)  07/06/22 (!) 312 lb (141.5 kg)  06/01/22 Marland Kitchen)  311 lb (141.1 kg)    General: Appears her stated age, obese in NAD. Skin: Warm, dry and intact. No rashes, lesions or ulcerations noted. HEENT: Head: normal shape and size; Eyes: sclera white, no icterus, conjunctiva pink, PERRLA and EOMs intact;  Neck:  Neck supple, trachea midline. No masses, lumps or thyromegaly present.  Cardiovascular: Normal rate and rhythm. S1,S2 noted.  No murmur, rubs or gallops noted. No JVD or BLE edema. No carotid bruits noted. Pulmonary/Chest: Normal effort and positive vesicular breath sounds. No respiratory distress. No wheezes, rales or ronchi noted.  Abdomen: Soft and nontender. Normal bowel sounds.  Musculoskeletal: Strength 5/5 BUE/BLE. No difficulty with gait.  Neurological: Alert and oriented. Cranial nerves II-XII grossly intact. Coordination normal.  Psychiatric: Mood and affect normal. Behavior is normal. Judgment and thought content normal.    BMET    Component Value Date/Time   NA 139 02/18/2022 1049   NA 144 06/24/2017 1439   NA 139 06/27/2014 1356   K 4.5 02/18/2022 1049   K 3.9 06/27/2014 1356   CL 105 02/18/2022 1049   CL 106 06/27/2014 1356   CO2 27 02/18/2022 1049   CO2 28 06/27/2014 1356   GLUCOSE 89 02/18/2022 1049   GLUCOSE 89 06/27/2014 1356   BUN 19 02/18/2022 1049   BUN 12 06/24/2017 1439   BUN 7 06/27/2014 1356   CREATININE 1.08 (H) 02/18/2022 1049   CALCIUM 9.2  02/18/2022 1049   CALCIUM 8.3 (L) 06/27/2014 1356   GFRNONAA 43 (L) 01/17/2018 1453   GFRNONAA 77 10/15/2017 1034   GFRAA 50 (L) 01/17/2018 1453   GFRAA 89 10/15/2017 1034    Lipid Panel  No results found for: "CHOL", "TRIG", "HDL", "CHOLHDL", "VLDL", "LDLCALC"  CBC    Component Value Date/Time   WBC 4.9 07/06/2022 1347   WBC 5.4 02/18/2022 1049   RBC 4.58 07/06/2022 1347   RBC 4.68 02/18/2022 1049   HGB 12.0 07/06/2022 1347   HCT 37.6 07/06/2022 1347   PLT 264 07/06/2022 1347   MCV 82 07/06/2022 1347   MCV 83 09/07/2014 0810   MCH 26.2 (L) 07/06/2022 1347   MCH 26.3 (L) 02/18/2022 1049   MCHC 31.9 07/06/2022 1347   MCHC 30.9 (L) 02/18/2022 1049   RDW 14.1 07/06/2022 1347   RDW 23.5 (H) 09/07/2014 0810   LYMPHSABS 1.0 07/06/2022 1347   LYMPHSABS 0.2 (L) 09/07/2014 0810   MONOABS 0.6 02/16/2018 1131   MONOABS 0.4 09/07/2014 0810   EOSABS 0.0 07/06/2022 1347   EOSABS 0.0 09/07/2014 0810   BASOSABS 0.0 07/06/2022 1347   BASOSABS 0.0 09/07/2014 0810    Hgb A1C Lab Results  Component Value Date   HGBA1C 5.3 10/15/2017           Assessment & Plan:   Preventative Health Maintenance:  Flu shot UTD Tetanus today Encouraged her to get her covid booster Discussed shingrix vaccine, she will check with her insurance company and schedule a visit if she would to get this done Pap smear declined today Mammogram UTD Cologuard ordered Encouraged her to consume a balanced diet and exercise regimen Advised her to see an eye doctor and dentist annually We will check CBC, c-Met, lipid, A1c and hep C today  RTC in 6 months, follow-up chronic conditions Nicki Reaper, NP

## 2023-05-20 NOTE — Addendum Note (Signed)
Addended by: Kavin Leech E on: 05/20/2023 04:01 PM   Modules accepted: Orders

## 2023-05-25 LAB — COMPLETE METABOLIC PANEL WITH GFR
AG Ratio: 1.8 (calc) (ref 1.0–2.5)
ALT: 11 U/L (ref 6–29)
AST: 15 U/L (ref 10–35)
Albumin: 4 g/dL (ref 3.6–5.1)
Alkaline phosphatase (APISO): 130 U/L (ref 37–153)
BUN/Creatinine Ratio: 12 (calc) (ref 6–22)
BUN: 13 mg/dL (ref 7–25)
CO2: 30 mmol/L (ref 20–32)
Calcium: 9.3 mg/dL (ref 8.6–10.4)
Chloride: 104 mmol/L (ref 98–110)
Creat: 1.07 mg/dL — ABNORMAL HIGH (ref 0.50–1.03)
Globulin: 2.2 g/dL (ref 1.9–3.7)
Glucose, Bld: 86 mg/dL (ref 65–99)
Potassium: 4.3 mmol/L (ref 3.5–5.3)
Sodium: 141 mmol/L (ref 135–146)
Total Bilirubin: 0.4 mg/dL (ref 0.2–1.2)
Total Protein: 6.2 g/dL (ref 6.1–8.1)
eGFR: 63 mL/min/{1.73_m2} (ref >=60)

## 2023-05-25 LAB — LIPID PANEL
Cholesterol: 177 mg/dL (ref <200)
HDL: 66 mg/dL (ref >=50)
LDL Cholesterol (Calc): 94 mg/dL
Non-HDL Cholesterol (Calc): 111 mg/dL (ref <130)
Total CHOL/HDL Ratio: 2.7 (calc) (ref <5.0)
Triglycerides: 82 mg/dL (ref <150)

## 2023-05-25 LAB — IGG, IGA, IGM
IgG (Immunoglobin G), Serum: 718 mg/dL (ref 600–1640)
IgM, Serum: 86 mg/dL (ref 50–300)
Immunoglobulin A: 156 mg/dL (ref 47–310)

## 2023-05-25 LAB — VITAMIN D 25 HYDROXY (VIT D DEFICIENCY, FRACTURES): Vit D, 25-Hydroxy: 33 ng/mL (ref 30–100)

## 2023-05-25 LAB — CBC
HCT: 38.6 % (ref 35.0–45.0)
Hemoglobin: 11.8 g/dL (ref 11.7–15.5)
MCH: 25.4 pg — ABNORMAL LOW (ref 27.0–33.0)
MCHC: 30.6 g/dL — ABNORMAL LOW (ref 32.0–36.0)
MCV: 83 fL (ref 80.0–100.0)
MPV: 11 fL (ref 7.5–12.5)
Platelets: 296 10*3/uL (ref 140–400)
RBC: 4.65 10*6/uL (ref 3.80–5.10)
RDW: 14.4 % (ref 11.0–15.0)
WBC: 4.9 10*3/uL (ref 3.8–10.8)

## 2023-05-25 LAB — HEPATITIS C ANTIBODY: Hepatitis C Ab: NONREACTIVE

## 2023-05-25 LAB — HEMOGLOBIN A1C
Hgb A1c MFr Bld: 6 %{Hb} — ABNORMAL HIGH (ref <5.7)
Mean Plasma Glucose: 126 mg/dL
eAG (mmol/L): 7 mmol/L

## 2023-05-25 LAB — FSH/LH
FSH: 56.5 m[IU]/mL
LH: 31.3 m[IU]/mL

## 2023-05-25 LAB — IRON,TIBC AND FERRITIN PANEL
%SAT: 10 % — ABNORMAL LOW (ref 16–45)
Ferritin: 10 ng/mL — ABNORMAL LOW (ref 16–232)
Iron: 41 ug/dL — ABNORMAL LOW (ref 45–160)
TIBC: 397 ug/dL (ref 250–450)

## 2023-05-25 LAB — PROGESTERONE: Progesterone: <0.5 ng/mL

## 2023-05-25 LAB — ESTROGENS, TOTAL: Estrogen: 117 pg/mL

## 2023-05-25 LAB — VITAMIN B12: Vitamin B-12: 277 pg/mL (ref 200–1100)

## 2023-06-02 LAB — COLOGUARD: COLOGUARD: NEGATIVE

## 2023-06-07 ENCOUNTER — Ambulatory Visit: Payer: 59 | Admitting: Podiatry

## 2023-06-21 ENCOUNTER — Other Ambulatory Visit: Payer: Self-pay | Admitting: Neurology

## 2023-06-21 ENCOUNTER — Encounter: Payer: Self-pay | Admitting: Neurology

## 2023-06-22 ENCOUNTER — Other Ambulatory Visit: Payer: Self-pay

## 2023-06-22 MED ORDER — TRAMADOL HCL 50 MG PO TABS: ORAL_TABLET | ORAL | 0 refills | Status: DC

## 2023-06-22 NOTE — Telephone Encounter (Signed)
Pt Last Seen 01/05/2023 Upcoming Appointment 07/22/2023 Jody Rump Lomax, NP)  Tramadol 50 mg Last Filled 11/05/2021 Escript 06/22/2023

## 2023-06-23 MED ORDER — GABAPENTIN 100 MG PO CAPS: 100.0000 mg | ORAL_CAPSULE | Freq: Three times a day (TID) | ORAL | 0 refills | Status: DC

## 2023-06-23 NOTE — Telephone Encounter (Signed)
Patient last seen on 01/05/23 "  She takes tramadol when more painful. Gabapentin and baclofen also help. " Follow up scheduled on 07/22/23

## 2023-07-19 ENCOUNTER — Other Ambulatory Visit: Payer: Self-pay | Admitting: Neurology

## 2023-07-20 NOTE — Telephone Encounter (Signed)
Last seen on 01/05/23 Follow up scheduled on 07/22/23

## 2023-07-21 NOTE — Progress Notes (Unsigned)
No chief complaint on file.   HISTORY OF PRESENT ILLNESS:  07/21/23 ALL:  Jody Lee is a 52 y.o. female here today for follow up for RRMS. She continues Ocrevus infusions. Last infusion She is tolerating well. Last MRI 05/2019 was stable.   She feels that she is doing fairly well. No new or exacerbating symptoms. She does have some R>L foot drop and spastic gait. No falls or assistive devices. She takes baclofen as needed, usually 20mg  in morning.  She is prescribed gabapentin 100mg  TID as needed for dysesthesias but rarely uses.   She has noticed some weakness in grip strength, bilaterally. R>L. She wears carpal tunnel brace that helps.   Mood is good. She is sleeping well. 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 She does continues to have waxing and waning fatigue. We switched modafinil to armodafinil at last visit 12/2021. She feels that it is working better. She She gets an average of 4 hours of sleep. She may sleep longer on weekends. She has not been able to walk as much as she would like.   Tramadol 50mg  BID usually used about a month before her infusion when pain worsens. Has some MS hug, back and shoulder pain. She last refilled on 11/05/2021 for 14 tablets.    Vitamin D was 33 in 05/2023. We increased dose to 5000iu daily.    HISTORY (copied from Dr Bonnita Hollow previous note)  Jody Lee is a 52 y.o. woman with relapsing remitting MS diagnosed in 2005.      Update 01/05/2023 She is on Ocrevus and had her last infusion November 16, 2022   She rarely has a little tickle in her throat but has never needed to stop an infusion.   IgG/IgM was normal 07/06/2022.   Vit D was 21.2 so she is back on 5000 U daily supplements.  .       Her gait is stable with a mild right foot drop when she walks longer distance.    She has dysesthetic pain, especially in the mid back with a squeezing sensation.   She feels she did worse the last 2 weeks of her last  infusion cycle.Marland Kitchen       Spasms are panful at night in her legs.   She has cramps at times,   She has pain in the left hand and making a fist is harder.    She notes her grip is a little worse though typing is fine.    Weakness only in hands not higher up in upper arm or shoulders.   She takes tramadol when more painful.   Gabapentin and baclofen also help.       She has bowel urgency but no incontinence.   No UTI.     Vision is stable.   She sees ophthalmology next week.      She has had more stress with daughter's diagnosis of Sjogren's with renal failure and father's health issues.  She has started therapy for the stress.    She eats more when stressed.     She has cognitive issues with reduced focus/attention at times.      She had gained weight back since the  bariatric surgery (Roex en Y) in the past.   She lost 140 pounds but gained 100 back.   Phentermine was not tolerated.      She has fatigue, both physical and cognitive.  Marland Kitchen  She works (from home).   She sleeps well most nights but is sleeping less due to the hours.   She is tired during the day and takes naps . She snores a little when on her back.    Provigil helps sleepiness.     PSG in 11/2018 did not show OSA (AHI = 3.3)        EPWORTH SLEEPINESS SCALE   On a scale of 0 - 3 what is the chance of dozing:   Sitting and Reading:                          1                                            Watching TV:                                      2 Sitting inactive in a public place:0 Passenger in car for one hour:0 Lying down to rest in the afternoon:2 Sitting and talking to someone:0 Sitting quietly after lunch:                  0 In a car, stopped in traffic:                 0   Total (out of 24):   5/24   Not sleepy.       MS History:   She was diagnosed with MS in 2005 after presenting with a spinal cord syndrome. She started with numbness and pain in the feet that worked its way up to her abdomen over a couple days.    Initially, she had an MRI of the spine that showed a plaque consistent with her symptoms. She was referred to Dr. Maple Hudson. He did a lumbar puncture and ordered an MRI of the brain. Both were consistent with multiple sclerosis and I started to see her. Initially, she was placed on Betaseron and did very well for the first few years. Then around 2011 or 2012, she had an exacerbation and testing showed that she had developed antibodies against Betaseron. Therefore, she was switched to Gilenya at that time. She has been on Gilenya for the last 4+ years and has done well. Specifically, she has not reported any difficulties with exacerbations and she tolerates the medication well.   Her last MRI was performed at [facility] November 2015.  MRI of the brain from 03/02/2013 showed  typical periventricular T2/FLAIR hyperintense foci, predominantly in the periventricular white matter.   There were no acute foci.  MRI in 06/15/2017 showed a new enahncing lesion so she switched to Ocrevus  Late 2018.    MRI of the brain 05/15/2019 showed no new lesions.   MRI of the brain 04/17/2018 showed no new lesions.  Enhancement previously seen on the 06/15/2017 MRI had resolved.   MRI of the brain 06/15/2017 showed 1 enhancing lesion in the right periatrial white matter not present on the 2017 MRI.  The MRI was otherwise unchanged.   MRI of the cervical spine 06/17/2017 showed about 5 or 6 T2 hyperintense foci within the spinal cord located at C2, C2-C3, C4, C5, C6-C7 and T2-T3.  None of the foci  were enhanced.  MRI was felt to be technically better than the 2017 MRI and some interval change could not be ruled out.   Other: We did a PSG 11/2018 but she had only negligible OSA with an AHI = 3.3.   An optional oral appliance was discussed for her snoring.   REVIEW OF SYSTEMS: Out of a complete 14 system review of symptoms, the patient complains only of the following symptoms, foot drop, gait abnormality, numbness,  tingling, fatigue, difficulty concentrating, and all other reviewed systems are negative.   ALLERGIES: Allergies  Allergen Reactions   Nsaids Other (See Comments)    Not supposed to take d/t bariatric surgery    Phentermine     Made her heart race   Ciprofloxacin Rash    Felt like heat/burning rash     HOME MEDICATIONS: Outpatient Medications Prior to Visit  Medication Sig Dispense Refill   acetaminophen (TYLENOL) 325 MG tablet Take 650 mg by mouth every 4 (four) hours as needed.     Armodafinil 250 MG tablet Take 1 tablet (250 mg total) by mouth daily. 90 tablet 0   baclofen (LIORESAL) 10 MG tablet TAKE 1 TABLET(10 MG) BY MOUTH THREE TIMES DAILY 270 tablet 3   fexofenadine (ALLEGRA) 180 MG tablet Take 180 mg by mouth daily.     gabapentin (NEURONTIN) 100 MG capsule TAKE 1 CAPSULE BY MOUTH 3 TIMES A DAY 90 capsule 0   ketoconazole (NIZORAL) 2 % cream Apply 1 Application topically 2 (two) times daily. 60 g 2   Multiple Vitamin (MULTIVITAMIN) tablet Take 1 tablet by mouth daily.     ocrelizumab 600 mg in sodium chloride 0.9 % 500 mL Inject 600 mg into the vein once.     traMADol (ULTRAM) 50 MG tablet 1 pill po bid prn 60 tablet 0   Facility-Administered Medications Prior to Visit  Medication Dose Route Frequency Provider Last Rate Last Admin   gadopentetate dimeglumine (MAGNEVIST) injection 20 mL  20 mL Intravenous Once PRN Sater, Pearletha Furl, MD         PAST MEDICAL HISTORY: Past Medical History:  Diagnosis Date   Anemia    Hypertension    Menorrhagia    MS (multiple sclerosis) (HCC)    Shingles    right side of chest and back   Vision abnormalities    Vitamin B 12 deficiency    Vitamin D deficiency      PAST SURGICAL HISTORY: Past Surgical History:  Procedure Laterality Date   CHOLECYSTECTOMY     ESOPHAGOGASTRODUODENOSCOPY (EGD) WITH PROPOFOL N/A 12/18/2019   Procedure: ESOPHAGOGASTRODUODENOSCOPY (EGD) WITH PROPOFOL;  Surgeon: Pasty Spillers, MD;  Location:  [facility] ENDOSCOPY;  Service: Endoscopy;  Laterality: N/A;   GASTRIC BYPASS       FAMILY HISTORY: Family History  Problem Relation Age of Onset   Stroke Mother    Heart disease Mother    Asthma Mother    Diabetes Mother    Hypertension Father    Heart disease Father    Diabetes Father    Asthma Brother    Alcoholism Brother    Obesity Brother    Heart disease Brother    Diabetes Maternal Grandmother    Lung cancer Maternal Grandfather    Sjogren's syndrome Daughter    Kidney disease Daughter    Breast cancer Neg Hx      SOCIAL HISTORY: Social History   Socioeconomic History   Marital status: Married    Spouse name: Not on file  Number of children: 1   Years of education: Not on file   Highest education level: Some college, no degree  Occupational History   Occupation: Magazine features editor  Tobacco Use   Smoking status: Never   Smokeless tobacco: Never  Vaping Use   Vaping status: Never Used  Substance and Sexual Activity   Alcohol use: No   Drug use: No   Sexual activity: Yes    Birth control/protection: Injection  Other Topics Concern   Not on file  Social History Narrative   Remarried in 2018. Has one daughter, one grandchild and 2 step children. Works from home   Social Determinants of Corporate investment banker Strain: Low Risk  (05/19/2023)   Overall Financial Resource Strain (CARDIA)    Difficulty of Paying Living Expenses: Not hard at all  Food Insecurity: No Food Insecurity (05/19/2023)   Hunger Vital Sign    Worried About Running Out of Food in the Last Year: Never true    Ran Out of Food in the Last Year: Never true  Transportation Needs: No Transportation Needs (05/19/2023)   PRAPARE - Administrator, Civil Service (Medical): No    Lack of Transportation (Non-Medical): No  Physical Activity: Insufficiently Active (05/19/2023)   Exercise Vital Sign    Days of Exercise per Week: 2 days    Minutes of Exercise per Session: 30 min  Stress: Stress  Concern Present (05/19/2023)   [employer] of Occupational Health - Occupational Stress Questionnaire    Feeling of Stress : To some extent  Social Connections: Moderately Isolated (05/19/2023)   Social Connection and Isolation Panel [NHANES]    Frequency of Communication with Friends and Family: Three times a week    Frequency of Social Gatherings with Friends and Family: Once a week    Attends Religious Services: Never    Database administrator or Organizations: No    Attends Engineer, structural: Not on file    Marital Status: Married  Catering manager Violence: Not on file      PHYSICAL EXAM  There were no vitals filed for this visit.    There is no height or weight on file to calculate BMI.   Generalized: Well developed, in no acute distress  Cardiology: normal rate and rhythm, no murmur auscultated  Respiratory: clear to auscultation bilaterally    Neurological examination  Mentation: Alert oriented to time, place, history taking. Follows all commands speech and language fluent Cranial nerve II-XII: Pupils were equal round reactive to light. Extraocular movements were full, visual field were full on confrontational test. Facial sensation and strength were normal. Head turning and shoulder shrug  were normal and symmetric. Motor: The motor testing reveals 5 over 5 strength of all 4 extremities. Good symmetric motor tone is noted throughout.  Sensory: Sensory testing is intact to soft touch on all 4 extremities. No evidence of extinction is noted.  Coordination: Cerebellar testing reveals good finger-nose-finger and heel-to-shin bilaterally.  Gait and station: Gait is normal. No foot drop noted  Reflexes: Deep tendon reflexes are symmetric and normal bilaterally.     DIAGNOSTIC DATA (LABS, IMAGING, TESTING) - I reviewed patient records, labs, notes, testing and imaging myself where available.  Lab Results  Component Value Date   WBC 4.9 05/19/2023   HGB  11.8 05/19/2023   HCT 38.6 05/19/2023   MCV 83.0 05/19/2023   PLT 296 05/19/2023      Component Value Date/Time   NA 141 05/19/2023  1017   NA 144 06/24/2017 1439   NA 139 06/27/2014 1356   K 4.3 05/19/2023 1017   K 3.9 06/27/2014 1356   CL 104 05/19/2023 1017   CL 106 06/27/2014 1356   CO2 30 05/19/2023 1017   CO2 28 06/27/2014 1356   GLUCOSE 86 05/19/2023 1017   GLUCOSE 89 06/27/2014 1356   BUN 13 05/19/2023 1017   BUN 12 06/24/2017 1439   BUN 7 06/27/2014 1356   CREATININE 1.07 (H) 05/19/2023 1017   CALCIUM 9.3 05/19/2023 1017   CALCIUM 8.3 (L) 06/27/2014 1356   PROT 6.2 05/19/2023 1017   PROT 6.6 05/16/2019 1626   PROT 6.9 06/27/2014 1356   ALBUMIN 4.3 05/16/2019 1626   ALBUMIN 3.8 06/27/2014 1356   AST 15 05/19/2023 1017   AST 15 06/27/2014 1356   ALT 11 05/19/2023 1017   ALT 20 06/27/2014 1356   ALKPHOS 141 (H) 11/01/2019 1359   ALKPHOS 105 06/27/2014 1356   BILITOT 0.4 05/19/2023 1017   BILITOT 0.3 05/16/2019 1626   BILITOT 0.4 06/27/2014 1356   GFRNONAA 43 (L) 01/17/2018 1453   GFRNONAA 77 10/15/2017 1034   GFRAA 50 (L) 01/17/2018 1453   GFRAA 89 10/15/2017 1034   Lab Results  Component Value Date   CHOL 177 05/19/2023   HDL 66 05/19/2023   LDLCALC 94 05/19/2023   TRIG 82 05/19/2023   CHOLHDL 2.7 05/19/2023   Lab Results  Component Value Date   HGBA1C 6.0 (H) 05/19/2023   Lab Results  Component Value Date   VITAMINB12 277 05/19/2023   Lab Results  Component Value Date   TSH 2.03 06/03/2021        No data to display               No data to display           ASSESSMENT AND PLAN  52 y.o. year old female  has a past medical history of Anemia, Hypertension, Menorrhagia, MS (multiple sclerosis) (HCC), Shingles, Vision abnormalities, Vitamin B 12 deficiency, and Vitamin D deficiency. here with   No diagnosis found.   Lydie is doing well, today. She will continue Ocrevus infusions. We will update labs. MRI stable 02/2022. She will  continue baclofen, gabapentin, tramadol and vitamin D as directed. I will refill armodafinil for shift work disorder as it has helped with cognitive fog and MS fatigue. She will continue to wear wrist brace. Consider repeat cervical imaging in future. MR brain stable 02/2022. Healthy lifestyle habits encouraged. She will follow up with Dr Epimenio Foot in 6 months.    No orders of the defined types were placed in this encounter.    No orders of the defined types were placed in this encounter.   Shawnie Dapper, MSN, FNP-C 07/21/2023, 1:08 PM  [facility] [address] [place], [place] [address] [phone]

## 2023-07-21 NOTE — Patient Instructions (Signed)

## 2023-07-22 ENCOUNTER — Encounter: Payer: Self-pay | Admitting: Family Medicine

## 2023-07-22 ENCOUNTER — Ambulatory Visit (INDEPENDENT_AMBULATORY_CARE_PROVIDER_SITE_OTHER): Payer: 59 | Admitting: Family Medicine

## 2023-07-22 VITALS — BP 172/65 | HR 64 | Ht 64.0 in | Wt 315.2 lb

## 2023-07-22 DIAGNOSIS — G4726 Circadian rhythm sleep disorder, shift work type: Secondary | ICD-10-CM

## 2023-07-22 DIAGNOSIS — G35 Multiple sclerosis: Secondary | ICD-10-CM | POA: Diagnosis not present

## 2023-07-22 DIAGNOSIS — R5383 Other fatigue: Secondary | ICD-10-CM

## 2023-07-22 DIAGNOSIS — Z79899 Other long term (current) drug therapy: Secondary | ICD-10-CM

## 2023-07-22 DIAGNOSIS — E559 Vitamin D deficiency, unspecified: Secondary | ICD-10-CM

## 2023-07-22 DIAGNOSIS — R269 Unspecified abnormalities of gait and mobility: Secondary | ICD-10-CM

## 2023-07-23 LAB — IGG, IGA, IGM
IgA/Immunoglobulin A, Serum: 146 mg/dL (ref 87–352)
IgG (Immunoglobin G), Serum: 683 mg/dL (ref 586–1602)
IgM (Immunoglobulin M), Srm: 81 mg/dL (ref 26–217)

## 2023-07-23 LAB — CBC WITH DIFFERENTIAL/PLATELET
Basophils Absolute: 0 10*3/uL (ref 0.0–0.2)
Basos: 0 %
EOS (ABSOLUTE): 0 10*3/uL (ref 0.0–0.4)
Eos: 0 %
Hematocrit: 37.3 % (ref 34.0–46.6)
Hemoglobin: 11.6 g/dL (ref 11.1–15.9)
Immature Grans (Abs): 0 10*3/uL (ref 0.0–0.1)
Immature Granulocytes: 0 %
Lymphocytes Absolute: 1.2 10*3/uL (ref 0.7–3.1)
Lymphs: 17 %
MCH: 26.1 pg — ABNORMAL LOW (ref 26.6–33.0)
MCHC: 31.1 g/dL — ABNORMAL LOW (ref 31.5–35.7)
MCV: 84 fL (ref 79–97)
Monocytes Absolute: 0.7 10*3/uL (ref 0.1–0.9)
Monocytes: 10 %
Neutrophils Absolute: 4.9 10*3/uL (ref 1.4–7.0)
Neutrophils: 73 %
Platelets: 289 10*3/uL (ref 150–450)
RBC: 4.45 x10E6/uL (ref 3.77–5.28)
RDW: 14.4 % (ref 11.7–15.4)
WBC: 6.8 10*3/uL (ref 3.4–10.8)

## 2023-08-16 ENCOUNTER — Other Ambulatory Visit: Payer: Self-pay | Admitting: Neurology

## 2023-08-17 NOTE — Telephone Encounter (Signed)
 Last seen on 07/22/23 Follow up scheduled on 02/03/24

## 2023-11-19 ENCOUNTER — Ambulatory Visit: Payer: Self-pay | Admitting: Internal Medicine

## 2023-11-19 NOTE — Progress Notes (Deleted)
 Subjective:    Patient ID: Jody Lee, female    DOB: [birth year], 53 y.o.   MRN: [id number]  HPI  Patient presents to clinic today for 48-month follow-up of chronic conditions.  HTN: Her BP today is.  She is not taking any antihypertensive medications at this time.  ECG from 12/2013 reviewed.  MS: She reports.  She is taking baclofen, gabapentin and tramadol as prescribed.  She follows with neurology.  Anxiety and depression: Chronic however she is not currently taking any medications for this.  She is not currently seeing a therapist.  She denies SI/HI.  Insomnia: She has difficulty.  She is taking armodafinil as prescribed.  She follows with neurology for this.  Iron deficiency anemia: Her last H/H was 11.6/37.3, 05/2023.  She is not taking any oral iron at this time.  She does not follow with hematology.  GERD: Triggered by.  She is not taking any medications for this at this time.  Upper GI from 12/2019 reviewed.  Prediabetes: Her last A1c was 6%, 05/2023.  She is not taking any oral diabetic medication at this time.  She does not check her sugars.  Review of Systems  Past Medical History:  Diagnosis Date   Anemia    Hypertension    Menorrhagia    MS (multiple sclerosis) (HCC)    Shingles    right side of chest and back   Vision abnormalities    Vitamin B 12 deficiency    Vitamin D deficiency     Current Outpatient Medications  Medication Sig Dispense Refill   acetaminophen (TYLENOL) 325 MG tablet Take 650 mg by mouth every 4 (four) hours as needed.     Armodafinil 250 MG tablet Take 1 tablet (250 mg total) by mouth daily. 90 tablet 0   baclofen (LIORESAL) 10 MG tablet TAKE 1 TABLET(10 MG) BY MOUTH THREE TIMES DAILY 270 tablet 3   fexofenadine (ALLEGRA) 180 MG tablet Take 180 mg by mouth daily.     gabapentin (NEURONTIN) 100 MG capsule TAKE 1 CAPSULE BY MOUTH 3 TIMES A DAY 90 capsule 3   ketoconazole (NIZORAL) 2 % cream Apply 1 Application topically 2 (two) times  daily. 60 g 2   Multiple Vitamin (MULTIVITAMIN) tablet Take 1 tablet by mouth daily.     ocrelizumab 600 mg in sodium chloride 0.9 % 500 mL Inject 600 mg into the vein once.     traMADol (ULTRAM) 50 MG tablet 1 pill po bid prn 60 tablet 0   No current facility-administered medications for this visit.   Facility-Administered Medications Ordered in Other Visits  Medication Dose Route Frequency Provider Last Rate Last Admin   gadopentetate dimeglumine (MAGNEVIST) injection 20 mL  20 mL Intravenous Once PRN Sater, Pearletha Furl, MD        Allergies  Allergen Reactions   Nsaids Other (See Comments)    Not supposed to take d/t bariatric surgery    Phentermine     Made her heart race   Ciprofloxacin Rash    Felt like heat/burning rash    Family History  Problem Relation Age of Onset   Stroke Mother    Heart disease Mother    Asthma Mother    Diabetes Mother    Hypertension Father    Heart disease Father    Diabetes Father    Asthma Brother    Alcoholism Brother    Obesity Brother    Heart disease Brother    Diabetes Maternal Grandmother  Lung cancer Maternal Grandfather    Sjogren's syndrome Daughter    Kidney disease Daughter    Breast cancer Neg Hx     Social History   Socioeconomic History   Marital status: Married    Spouse name: Not on file   Number of children: 1   Years of education: Not on file   Highest education level: Some college, no degree  Occupational History   Occupation: Magazine features editor  Tobacco Use   Smoking status: Never   Smokeless tobacco: Never  Vaping Use   Vaping status: Never Used  Substance and Sexual Activity   Alcohol use: No   Drug use: No   Sexual activity: Yes    Birth control/protection: Injection  Other Topics Concern   Not on file  Social History Narrative   Remarried in 2018. Has one daughter, one grandchild and 2 step children. Works from home   Social Drivers of Corporate investment banker Strain: Low Risk  (05/19/2023)    Overall Financial Resource Strain (CARDIA)    Difficulty of Paying Living Expenses: Not hard at all  Food Insecurity: No Food Insecurity (05/19/2023)   Hunger Vital Sign    Worried About Running Out of Food in the Last Year: Never true    Ran Out of Food in the Last Year: Never true  Transportation Needs: No Transportation Needs (05/19/2023)   PRAPARE - Administrator, Civil Service (Medical): No    Lack of Transportation (Non-Medical): No  Physical Activity: Insufficiently Active (05/19/2023)   Exercise Vital Sign    Days of Exercise per Week: 2 days    Minutes of Exercise per Session: 30 min  Stress: Stress Concern Present (05/19/2023)   [employer] of Occupational Health - Occupational Stress Questionnaire    Feeling of Stress : To some extent  Social Connections: Moderately Isolated (05/19/2023)   Social Connection and Isolation Panel [NHANES]    Frequency of Communication with Friends and Family: Three times a week    Frequency of Social Gatherings with Friends and Family: Once a week    Attends Religious Services: Never    Database administrator or Organizations: No    Attends Engineer, structural: Not on file    Marital Status: Married  Catering manager Violence: Not on file     Constitutional: Denies fever, malaise, fatigue, headache or abrupt weight changes.  HEENT: Denies eye pain, eye redness, ear pain, ringing in the ears, wax buildup, runny nose, nasal congestion, bloody nose, or sore throat. Respiratory: Denies difficulty breathing, shortness of breath, cough or sputum production.   Cardiovascular: Pt reports intermittent swelling in legs. Denies chest pain, chest tightness, palpitations or swelling in the hands.  Gastrointestinal: Pt reports constipation, intermittent reflux. Denies abdominal pain, bloating, diarrhea or blood in the stool.  GU: Denies urgency, frequency, pain with urination, burning sensation, blood in urine, odor or  discharge. Musculoskeletal: Denies decrease in range of motion, difficulty with gait, muscle pain or joint pain and swelling.  Skin: Denies redness, rashes, lesions or ulcercations.  Neurological: Patient reports insomnia , hot flashes, difficulty with memory.  Denies dizziness, difficulty with speech or problems with balance and coordination.  Psych: Patient has a history of anxiety and depression.  Denies SI/HI.  No other specific complaints in a complete review of systems (except as listed in HPI above).     Objective:   Physical Exam   There were no vitals taken for this visit.  Wt  Readings from Last 3 Encounters:  07/22/23 (!) 315 lb 3.2 oz (143 kg)  05/19/23 (!) 312 lb (141.5 kg)  01/05/23 (!) 313 lb (142 kg)    General: Appears her stated age, obese in NAD. Skin: Warm, dry and intact. No rashes, lesions or ulcerations noted. HEENT: Head: normal shape and size; Eyes: sclera white, no icterus, conjunctiva pink, PERRLA and EOMs intact;  Neck:  Neck supple, trachea midline. No masses, lumps or thyromegaly present.  Cardiovascular: Normal rate and rhythm. S1,S2 noted.  No murmur, rubs or gallops noted. No JVD or BLE edema. No carotid bruits noted. Pulmonary/Chest: Normal effort and positive vesicular breath sounds. No respiratory distress. No wheezes, rales or ronchi noted.  Abdomen: Soft and nontender. Normal bowel sounds.  Musculoskeletal: Strength 5/5 BUE/BLE. No difficulty with gait.  Neurological: Alert and oriented. Cranial nerves II-XII grossly intact. Coordination normal.  Psychiatric: Mood and affect normal. Behavior is normal. Judgment and thought content normal.    BMET    Component Value Date/Time   NA 141 05/19/2023 1017   NA 144 06/24/2017 1439   NA 139 06/27/2014 1356   K 4.3 05/19/2023 1017   K 3.9 06/27/2014 1356   CL 104 05/19/2023 1017   CL 106 06/27/2014 1356   CO2 30 05/19/2023 1017   CO2 28 06/27/2014 1356   GLUCOSE 86 05/19/2023 1017   GLUCOSE 89  06/27/2014 1356   BUN 13 05/19/2023 1017   BUN 12 06/24/2017 1439   BUN 7 06/27/2014 1356   CREATININE 1.07 (H) 05/19/2023 1017   CALCIUM 9.3 05/19/2023 1017   CALCIUM 8.3 (L) 06/27/2014 1356   GFRNONAA 43 (L) 01/17/2018 1453   GFRNONAA 77 10/15/2017 1034   GFRAA 50 (L) 01/17/2018 1453   GFRAA 89 10/15/2017 1034    Lipid Panel     Component Value Date/Time   CHOL 177 05/19/2023 1017   TRIG 82 05/19/2023 1017   HDL 66 05/19/2023 1017   CHOLHDL 2.7 05/19/2023 1017   LDLCALC 94 05/19/2023 1017    CBC    Component Value Date/Time   WBC 6.8 07/22/2023 1325   WBC 4.9 05/19/2023 1017   RBC 4.45 07/22/2023 1325   RBC 4.65 05/19/2023 1017   HGB 11.6 07/22/2023 1325   HCT 37.3 07/22/2023 1325   PLT 289 07/22/2023 1325   MCV 84 07/22/2023 1325   MCV 83 09/07/2014 0810   MCH 26.1 (L) 07/22/2023 1325   MCH 25.4 (L) 05/19/2023 1017   MCHC 31.1 (L) 07/22/2023 1325   MCHC 30.6 (L) 05/19/2023 1017   RDW 14.4 07/22/2023 1325   RDW 23.5 (H) 09/07/2014 0810   LYMPHSABS 1.2 07/22/2023 1325   LYMPHSABS 0.2 (L) 09/07/2014 0810   MONOABS 0.6 02/16/2018 1131   MONOABS 0.4 09/07/2014 0810   EOSABS 0.0 07/22/2023 1325   EOSABS 0.0 09/07/2014 0810   BASOSABS 0.0 07/22/2023 1325   BASOSABS 0.0 09/07/2014 0810    Hgb A1C Lab Results  Component Value Date   HGBA1C 6.0 (H) 05/19/2023           Assessment & Plan:     RTC in 6 months for your annual exam Nicki Reaper, NP

## 2023-11-25 ENCOUNTER — Ambulatory Visit: Payer: Self-pay

## 2023-11-25 NOTE — Telephone Encounter (Signed)
 Copied from CRM [id number]. Topic: Clinical - Red Word Triage >> Nov 25, 2023 11:00 AM Oddis Bench wrote: Red Word that prompted transfer to Nurse Triage: Patient is calling bc she fell, she is stating that she scrape and bruise it hurts, swollen has been putting ice on it. Feel on knee cap   Chief Complaint: Falls, Injury  Symptoms: Swelling, Scrape, Pain  Frequency: This Morning  Pertinent Negatives: Patient denies fever, numbness  Disposition: [] ED /[x] Urgent Care (no appt availability in office) / [] Appointment(In office/virtual)/ []  [facility]/ [] Home Care/ [] Refused Recommended Disposition /[]  Mobile Bus/ []  Follow-up with PCP  Additional Notes: TT is being triaged for a post fall related injury, the patient states she fell on her right knee, and has a scrape on it. The patient is without numbness or tingling, and is currently rating the pain as a four on a numeric pain scale. She has taken her routine medication which does include medications for pain. Recommended the patient see a provider within four hours due to presentation.   Reason for Disposition  [1] MODERATE weakness (i.e., interferes with work, school, normal activities) AND [2] new-onset or worsening  Answer Assessment - Initial Assessment Questions 1. MECHANISM: "How did the fall happen?"     Tripped over the lip at the front door.  2. DOMESTIC VIOLENCE AND ELDER ABUSE SCREENING: "Did you fall because someone pushed you or tried to hurt you?" If Yes, ask: "Are you safe now?"     No  3. ONSET: "When did the fall happen?" (e.g., minutes, hours, or days ago)     This morning at nine o clock.  4. LOCATION: "What part of the body hit the ground?" (e.g., back, buttocks, head, hips, knees, hands, head, stomach)     Right Knee   5. INJURY: "Did you hurt (injure) yourself when you fell?" If Yes, ask: "What did you injure? Tell me more about this?" (e.g., body area; type of injury; pain severity)"      Scrape of the skin on the knee  6. PAIN: "Is there any pain?" If Yes, ask: "How bad is the pain?" (e.g., Scale 1-10; or mild,  moderate, severe)   - NONE (0): No pain   - MILD (1-3): Doesn't interfere with normal activities    - MODERATE (4-7): Interferes with normal activities or awakens from sleep    - SEVERE (8-10): Excruciating pain, unable to do any normal activities      With weight bearing, 4, has taken medication. 8 Without medication.   7. SIZE: For cuts, bruises, or swelling, ask: "How large is it?" (e.g., inches or centimeters)      Scrape  8. PREGNANCY: "Is there any chance you are pregnant?" "When was your last menstrual period?"     No and NO  9. OTHER SYMPTOMS: "Do you have any other symptoms?" (e.g., dizziness, fever, weakness; new onset or worsening).        Bruising  10. CAUSE: "What do you think caused the fall (or falling)?" (e.g., tripped, dizzy spell)       Tripped over the entrance step at the door.  Protocols used: Falls and [facility]

## 2023-11-26 ENCOUNTER — Ambulatory Visit (INDEPENDENT_AMBULATORY_CARE_PROVIDER_SITE_OTHER): Admitting: Family Medicine

## 2023-11-26 ENCOUNTER — Encounter: Payer: Self-pay | Admitting: Family Medicine

## 2023-11-26 VITALS — BP 116/72 | HR 67 | Ht 64.0 in | Wt 312.1 lb

## 2023-11-26 DIAGNOSIS — S8001XA Contusion of right knee, initial encounter: Secondary | ICD-10-CM

## 2023-11-26 DIAGNOSIS — G35 Multiple sclerosis: Secondary | ICD-10-CM

## 2023-11-26 DIAGNOSIS — M25561 Pain in right knee: Secondary | ICD-10-CM

## 2023-11-26 MED ORDER — TRAMADOL HCL 50 MG PO TABS: 50.0000 mg | ORAL_TABLET | Freq: Three times a day (TID) | ORAL | 0 refills | Status: AC | PRN

## 2023-11-26 NOTE — Progress Notes (Signed)
 Subjective:    Patient ID: Jody Lee, female    DOB: 1971-01-23, 53 y.o.   MRN: [id number]  Jody Lee is a 53 y.o. female presenting on 11/26/2023 for Knee Pain  Patient presents for a same day appointment.  PCP Helayne Lo, FNP   HPI  Discussed the use of AI scribe software for clinical note transcription with the patient, who gave verbal consent to proceed.  History of Present Illness   Jody Lee is a 53 year old female with multiple sclerosis who presents with right knee pain following a fall.  She fell yesterday morning at her brother's house while trying to enter the door with a large bag, resulting in a forward fall onto her right knee and possibly catching herself with her arm. The pain has worsened and is now radiating down into her shin.  She sought care at a walk-in clinic [facility]) where x-rays were performed on her right knee, neck, and left foot. The initial review did not show any fractures, but the radiologist's report is pending. She has a CD of the x-rays but has not been able to view them.  She manages the pain with rest, ice, and elevation. The pain increases when walking, particularly radiating down the shin, with swelling and bruising around the knee. The pain worsens with knee extension and there is a sensation of instability when walking.  She has a history of multiple sclerosis and uses tramadol  for pain management, which she is running low on. She also uses baclofen  and gabapentin  for her MS symptoms. She is concerned about her ability to care for her father, who is recovering from knee surgery, due to her current condition.  She mentions a previous fall two months ago due to foot drop associated with her MS, which resulted in a bruise on her arm. She is worried about the impact of her current knee injury on her ability to perform daily activities, including caring for her father.  No knee buckling today but it was very painful  yesterday. There was a sensation of shifting in the knee when walking yesterday. No issues with her meniscus in the past.           11/26/2023    4:11 PM 05/19/2023   10:26 AM 05/10/2023    1:13 PM  Depression screen PHQ 2/9  Decreased Interest 0 1 0  Down, Depressed, Hopeless 0 1 0  PHQ - 2 Score 0 2 0  Altered sleeping 0 1 0  Tired, decreased energy 1 1 0  Change in appetite 0 2 0  Feeling bad or failure about yourself  0 1 0  Trouble concentrating 0 0 0  Moving slowly or fidgety/restless 0 1 0  Suicidal thoughts 0 0 0  PHQ-9 Score 1 8 0  Difficult doing work/chores Not difficult at all Somewhat difficult        11/26/2023    4:12 PM 05/19/2023   10:26 AM 05/10/2023    1:12 PM 02/18/2022   10:41 AM  GAD 7 : Generalized Anxiety Score  Nervous, Anxious, on Edge 1 2 0 1  Control/stop worrying 1 1 0 1  Worry too much - different things 1 2 0 1  Trouble relaxing 1 2 0 1  Restless 1 1 0 0  Easily annoyed or irritable 1 2 0 1  Afraid - awful might happen 0 0 0 0  Total GAD 7 Score 6 10 0 5  Anxiety Difficulty  Not difficult at all  Not difficult at all     Social History   Tobacco Use   Smoking status: Never   Smokeless tobacco: Never  Vaping Use   Vaping status: Never Used  Substance Use Topics   Alcohol use: No   Drug use: No    Review of Systems Per HPI unless specifically indicated above     Objective:    BP 116/72 (BP Location: Right Arm, Patient Position: Sitting, Cuff Size: Large)   Pulse 67   Ht 5\' 4"  (1.626 m)   Wt (!) 312 lb 2 oz (141.6 kg)   SpO2 99%   BMI 53.58 kg/m   Wt Readings from Last 3 Encounters:  11/26/23 (!) 312 lb 2 oz (141.6 kg)  07/22/23 (!) 315 lb 3.2 oz (143 kg)  05/19/23 (!) 312 lb (141.5 kg)    Physical Exam Vitals and nursing note reviewed.  Constitutional:      General: She is not in acute distress.    Appearance: Normal appearance. She is well-developed. She is obese. She is not diaphoretic.     Comments: Well-appearing,  comfortable, cooperative  HENT:     Head: Normocephalic and atraumatic.  Eyes:     General:        Right eye: No discharge.        Left eye: No discharge.     Conjunctiva/sclera: Conjunctivae normal.  Cardiovascular:     Rate and Rhythm: Normal rate.  Pulmonary:     Effort: Pulmonary effort is normal.  Musculoskeletal:     Comments: Right Knees Inspection: Normal appearance and symmetrical. Large body habitus. Ecchymosis below knee. Abrasion superficial. Palpation: Mild +TTP R knee only Lateral joint line. ROM: Full active ROM bilaterally but pain with extension Special Testing: Lachman / Valgus/Varus tests negative with intact ligaments (ACL, MCL, LCL). Standing Thessaly positive R lateral pain but no weakness or instability for meniscus Strength: 5/5 intact knee flex/ext, ankle dorsi/plantarflex Neurovascular: distally intact sensation light touch and pulses   Skin:    General: Skin is warm and dry.     Findings: No erythema or rash.  Neurological:     Mental Status: She is alert and oriented to person, place, and time.  Psychiatric:        Mood and Affect: Mood normal.        Behavior: Behavior normal.        Thought Content: Thought content normal.     Comments: Well groomed, good eye contact, normal speech and thoughts     Results for orders placed or performed in visit on 07/22/23  CBC with Differential/Platelets   Collection Time: 07/22/23  1:25 PM  Result Value Ref Range   WBC 6.8 3.4 - 10.8 x10E3/uL   RBC 4.45 3.77 - 5.28 x10E6/uL   Hemoglobin 11.6 11.1 - 15.9 g/dL   Hematocrit 16.1 09.6 - 46.6 %   MCV 84 79 - 97 fL   MCH 26.1 (L) 26.6 - 33.0 pg   MCHC 31.1 (L) 31.5 - 35.7 g/dL   RDW 04.5 40.9 - 81.1 %   Platelets 289 150 - 450 x10E3/uL   Neutrophils 73 Not Estab. %   Lymphs 17 Not Estab. %   Monocytes 10 Not Estab. %   Eos 0 Not Estab. %   Basos 0 Not Estab. %   Neutrophils Absolute 4.9 1.4 - 7.0 x10E3/uL   Lymphocytes Absolute 1.2 0.7 - 3.1 x10E3/uL    Monocytes Absolute 0.7 0.1 -  0.9 x10E3/uL   EOS (ABSOLUTE) 0.0 0.0 - 0.4 x10E3/uL   Basophils Absolute 0.0 0.0 - 0.2 x10E3/uL   Immature Granulocytes 0 Not Estab. %   Immature Grans (Abs) 0.0 0.0 - 0.1 x10E3/uL  IgG, IgA, IgM   Collection Time: 07/22/23  1:25 PM  Result Value Ref Range   IgG (Immunoglobin G), Serum 683 586 - 1,602 mg/dL   IgA/Immunoglobulin A, Serum 146 87 - 352 mg/dL   IgM (Immunoglobulin M), Srm 81 26 - 217 mg/dL      Assessment & Plan:   Problem List Items Addressed This Visit     Multiple sclerosis (HCC)   Other Visit Diagnoses       Acute pain of right knee    -  Primary   Relevant Medications   traMADol  (ULTRAM ) 50 MG tablet     Contusion of right knee, initial encounter       Relevant Medications   traMADol  (ULTRAM ) 50 MG tablet        Right Knee Contusion / Acute Pain S/p Traumatic fall injury 1 day ago. Reviewed outside notes from [facility] X-rays showed no fractures based on provider report, however official radiologist report is not available yet. I cannot see images being in [facility], as they are in [facility] system. . Pain, swelling, and bruising suggest a contusion to R Knee Patella possibly involving the meniscus. Ligaments intact. Explained management involves immobilization and pain control.  - Prescribe tramadol  50 mg every 8 hours as needed for pain, 21 pills for 7 days. Note she normally takes AS NEEDED for MS and other pain per Neurologist. I agreed for a one time refill with upcoming holiday weekend. - Advise icing the knee to reduce swelling. - Recommend using heat after a few days if stiffness increases. - Suggest using topical Voltaren  for localized pain relief. - Advise caution with high-impact activities and limit to steps as maximum activity. - Wait on official Radiologist result on X-rays - Consider referral to orthopedics and MRI if symptoms persist or worsen.  Multiple Sclerosis (MS) MS contributes to foot drop and  may have influenced the fall. She manages symptoms with baclofen  and gabapentin . - Continue current MS medications including baclofen  and gabapentin .        No orders of the defined types were placed in this encounter.   Meds ordered this encounter  Medications   traMADol  (ULTRAM ) 50 MG tablet    Sig: Take 1 tablet (50 mg total) by mouth every 8 (eight) hours as needed for up to 7 days for moderate pain (pain score 4-6).    Dispense:  21 tablet    Refill:  0    Follow up plan: Return if symptoms worsen or fail to improve.   Domingo Friend, DO [facility] 11/26/2023, 3:46 PM

## 2023-11-26 NOTE — Patient Instructions (Addendum)
 Thank you for coming to the office today.  Right Knee Contusion  START anti inflammatory topical - OTC Voltaren  (generic Diclofenac ) topical 2-4 times a day as needed for pain swelling of affected joint for 1-2 weeks or longer.  Keep doing ice if swelling and sore after activity Heat will help mobility  Tramadol  refill 50mg  up to 3 times per day 7 day supply as needed for pain  Compression / brace can help support  Possible meniscus injury  X-ray reports reviewed from Previous Doctor, but we will wait for official report.   Please schedule a Follow-up Appointment to: Return if symptoms worsen or fail to improve.  If you have any other questions or concerns, please feel free to call the office or send a message through MyChart. You may also schedule an earlier appointment if necessary.  Additionally, you may be receiving a survey about your experience at our office within a few days to 1 week by e-mail or mail. We value your feedback.  Domingo Friend, DO [facility], [place]

## 2023-11-29 ENCOUNTER — Other Ambulatory Visit: Payer: Self-pay | Admitting: Family Medicine

## 2023-11-29 MED ORDER — ARMODAFINIL 250 MG PO TABS: 250.0000 mg | ORAL_TABLET | Freq: Every day | ORAL | 1 refills | Status: DC

## 2023-11-29 NOTE — Telephone Encounter (Signed)
 Last seen 07/22/23 and next f/u 02/03/24. Last refilled 05/06/23 #90.

## 2023-12-03 ENCOUNTER — Encounter: Payer: Self-pay | Admitting: Hematology and Oncology

## 2023-12-03 ENCOUNTER — Other Ambulatory Visit (HOSPITAL_COMMUNITY): Payer: Self-pay

## 2023-12-03 ENCOUNTER — Telehealth: Payer: Self-pay

## 2023-12-03 NOTE — Telephone Encounter (Signed)
 Pharmacy Patient Advocate Encounter   Received notification from CoverMyMeds that prior authorization for Armodafinil  250MG  tablets is required/requested.   Insurance verification completed.   The patient is insured through [facility] .   Per test claim: PA required; PA submitted to above mentioned insurance via CoverMyMeds Key/confirmation #/EOC [id number] Status is pending

## 2023-12-06 ENCOUNTER — Other Ambulatory Visit (HOSPITAL_COMMUNITY): Payer: Self-pay

## 2023-12-06 NOTE — Telephone Encounter (Signed)
 Pharmacy Patient Advocate Encounter  Received notification from [employer] that Prior Authorization for Armodafinil  250MG  tablets has been APPROVED from 12/03/2023 to 06/03/2024. Ran test claim, Copay is $0. This test claim was processed through [facility]- copay amounts may vary at other pharmacies due to pharmacy/plan contracts, or as the patient moves through the different stages of their insurance plan.   PA #/Case ID/Reference #: PA Case ID #: [id number]

## 2023-12-07 ENCOUNTER — Encounter: Payer: Self-pay | Admitting: Family Medicine

## 2023-12-07 MED ORDER — ARMODAFINIL 250 MG PO TABS: 250.0000 mg | ORAL_TABLET | Freq: Every day | ORAL | 1 refills | Status: DC

## 2023-12-07 NOTE — Addendum Note (Signed)
 Addended by: Oneita Bihari on: 12/07/2023 11:32 AM   Modules accepted: Orders

## 2023-12-07 NOTE — Telephone Encounter (Signed)
 Dr. Godwin Lat- please resend to [facility]. Pt states [facility] does not have this in stock. Thank you

## 2024-02-03 ENCOUNTER — Ambulatory Visit (INDEPENDENT_AMBULATORY_CARE_PROVIDER_SITE_OTHER): Payer: 59 | Admitting: Neurology

## 2024-02-03 ENCOUNTER — Encounter: Payer: Self-pay | Admitting: Neurology

## 2024-02-03 VITALS — BP 130/88 | HR 81 | Ht 64.0 in | Wt 307.5 lb

## 2024-02-03 DIAGNOSIS — G35 Multiple sclerosis: Secondary | ICD-10-CM | POA: Diagnosis not present

## 2024-02-03 DIAGNOSIS — E559 Vitamin D deficiency, unspecified: Secondary | ICD-10-CM | POA: Diagnosis not present

## 2024-02-03 DIAGNOSIS — R5383 Other fatigue: Secondary | ICD-10-CM

## 2024-02-03 DIAGNOSIS — R269 Unspecified abnormalities of gait and mobility: Secondary | ICD-10-CM

## 2024-02-03 DIAGNOSIS — Z79899 Other long term (current) drug therapy: Secondary | ICD-10-CM | POA: Diagnosis not present

## 2024-02-03 DIAGNOSIS — Z6841 Body Mass Index (BMI) 40.0 and over, adult: Secondary | ICD-10-CM

## 2024-02-03 NOTE — Progress Notes (Signed)
 [facility]  PATIENT: Jody Lee DOB: January 17, 1971  REFERRING DOCTOR OR PCP:  Lynwood Gelineau SOURCE: patient and labs, notes in EMR and MRI on PACS ________________________________   HISTORICAL  CHIEF COMPLAINT:  Chief Complaint  Patient presents with   Follow-up    Pt in room 11. Alone. Here for MS follow up.DMT: Ocrevus ,Next infusion date: 05/22/2024. Pt reports doing okay, pt fell twice in last 60 days.  Most recent falls was Saturday pt fell on her face. No head injury, pt has right foot drop.    HISTORY OF PRESENT ILLNESS:  Jody Lee is a 53 y.o. woman with relapsing remitting MS diagnosed in 2005.     Update 02/03/2024 She is on Ocrevus  and had her next  infusion in October 2025   she experiences a mild sensation in the back of the throat at times during the infusions but has not needed to stop the medication.  IgG/IgM was normal 07/2023   Vit D was low and she takes daily supplements.  .      She has a right foot drop and she has had 2 falls - no LOC.   The right leg is weak.   She sometimes has mild spasticity.  She has baclofen  and occasionally takes one at bedtime.    Spasms are panful at night in her legs.   She has cramps at times,   She has pain in the left hand and making a fist is harder.    She notes her grip is a little worse though typing is fine.    Weakness only in hands not higher up in upper arm or shoulders.   She takes tramadol  when more painful.   Gabapentin  and baclofen  also help.    Se has mild urinary urgency.  She has bowel urgency as well.         Vision is stable.   She sees ophthalmology next week.     She has cognitive issues with reduced focus/attention at times.     She had gained weight back since the  bariatric surgery (Roex en Y) in the past.   She lost 140 pounds but gained 100 back.   Phentermine  was not tolerated.     She has fatigue, both physical and cognitive.  Jody Lee   She works (from home).   She sleeps well most  nights but is sleeping less due to the hours.   She is tired during the day and takes naps . She snores a little when on her back.  NuVigil  helps sleepiness better than Provigil .     PSG in 11/2018 did not show OSA (AHI = 3.3)     She has had more stress with daughter's diagnosis of Sjogren's with renal failure .   Her father has health issues and has had a couple operations.   She takes care of him.   EPWORTH SLEEPINESS SCALE  On a scale of 0 - 3 what is the chance of dozing:  Sitting and Reading:  1    Watching TV:   2 Sitting inactive in a public place: 0 Passenger in car for one hour: 0 Lying down to rest in the afternoon: 2 Sitting and talking to someone: 0 Sitting quietly after lunch:  0 In a car, stopped in traffic:  0  Total (out of 24):   5/24   Not sleepy.     MS History:   She was diagnosed with MS in 2005 after  presenting with a spinal cord syndrome. She started with numbness and pain in the feet that worked its way up to her abdomen over a couple days.   Initially, she had an MRI of the spine that showed a plaque consistent with her symptoms. She was referred to Dr. Leopoldo. He did a lumbar puncture and ordered an MRI of the brain. Both were consistent with multiple sclerosis and I started to see her. Initially, she was placed on Betaseron and did very well for the first few years. Then around 2011 or 2012, she had an exacerbation and testing showed that she had developed antibodies against Betaseron. Therefore, she was switched to Gilenya  at that time. She has been on Gilenya  for the last 4+ years and has done well. Specifically, she has not reported any difficulties with exacerbations and she tolerates the medication well.   Her last MRI was performed at [facility] November 2015.  MRI of the brain from 03/02/2013 showed  typical periventricular T2/FLAIR hyperintense foci, predominantly in the periventricular white matter.   There were no acute foci.  MRI in  06/15/2017 showed a new enahncing lesion so she switched to Ocrevus   Late 2018.   MRI of the brain 05/15/2019 showed no new lesions.  MRI of the brain 04/17/2018 showed no new lesions.  Enhancement previously seen on the 06/15/2017 MRI had resolved.  MRI of the brain 06/15/2017 showed 1 enhancing lesion in the right periatrial white matter not present on the 2017 MRI.  The MRI was otherwise unchanged.  MRI of the cervical spine 06/17/2017 showed about 5 or 6 T2 hyperintense foci within the spinal cord located at C2, C2-C3, C4, C5, C6-C7 and T2-T3.  None of the foci were enhanced.  MRI was felt to be technically better than the 2017 MRI and some interval change could not be ruled out.  Other: We did a PSG 11/2018 but she had only negligible OSA with an AHI = 3.3.   An optional oral appliance was discussed for her snoring.    REVIEW OF SYSTEMS: Constitutional: No fevers, chills, sweats, or change in appetite.   She has fatigue. Eyes: No visual changes, double vision, eye pain Ear, nose and throat: No hearing loss, ear pain, nasal congestion, sore throat Cardiovascular: No chest pain, palpitations Respiratory:  No shortness of breath at rest or with exertion.   No wheezes GastrointestinaI: No nausea, vomiting, diarrhea, abdominal pain, fecal incontinence Genitourinary:  No dysuria, urinary retention or frequency.  No nocturia. Musculoskeletal:  No neck pain, back pain Integumentary: No rash, pruritus, skin lesions Neurological: as above Psychiatric: There is depression and anxiety.  She is tearful at times. Endocrine: No palpitations, diaphoresis, change in appetite, change in weigh or increased thirst Hematologic/Lymphatic:  No anemia, purpura, petechiae. Allergic/Immunologic: No itchy/runny eyes, nasal congestion, recent allergic reactions, rashes  ALLERGIES: Allergies  Allergen Reactions   Nsaids Other (See Comments)    Not supposed to take d/t bariatric surgery    Phentermine      Made her  heart race   Ciprofloxacin Rash    Felt like heat/burning rash    HOME MEDICATIONS:  Current Outpatient Medications:    acetaminophen (TYLENOL) 325 MG tablet, Take 650 mg by mouth every 4 (four) hours as needed., Disp: , Rfl:    Armodafinil  250 MG tablet, Take 1 tablet (250 mg total) by mouth daily., Disp: 90 tablet, Rfl: 1   baclofen  (LIORESAL ) 10 MG tablet, TAKE 1 TABLET(10 MG) BY MOUTH THREE  TIMES DAILY (Patient taking differently: TAKE 1 TABLET(10 MG) BY MOUTH THREE TIMES DAILY as needed), Disp: 270 tablet, Rfl: 3   fexofenadine (ALLEGRA) 180 MG tablet, Take 180 mg by mouth daily., Disp: , Rfl:    gabapentin  (NEURONTIN ) 100 MG capsule, TAKE 1 CAPSULE BY MOUTH 3 TIMES A DAY, Disp: 90 capsule, Rfl: 3   ketoconazole  (NIZORAL ) 2 % cream, Apply 1 Application topically 2 (two) times daily., Disp: 60 g, Rfl: 2   Multiple Vitamin (MULTIVITAMIN) tablet, Take 1 tablet by mouth daily., Disp: , Rfl:    ocrelizumab  600 mg in sodium chloride  0.9 % 500 mL, Inject 600 mg into the vein once., Disp: , Rfl:    traMADol  (ULTRAM ) 50 MG tablet, Take 50 mg by mouth every 6 (six) hours as needed., Disp: , Rfl:    VITAMIN D , CHOLECALCIFEROL, PO, Take 10,000 Units by mouth daily., Disp: , Rfl:  No current facility-administered medications for this visit.  Facility-Administered Medications Ordered in Other Visits:    gadopentetate dimeglumine  (MAGNEVIST ) injection 20 mL, 20 mL, Intravenous, Once PRN, Makinzie Considine, [name] LABOR, MD  PAST MEDICAL HISTORY: Past Medical History:  Diagnosis Date   Anemia    Hypertension    Menorrhagia    MS (multiple sclerosis) (HCC)    Shingles    right side of chest and back   Vision abnormalities    Vitamin B 12 deficiency    Vitamin D  deficiency     PAST SURGICAL HISTORY: Past Surgical History:  Procedure Laterality Date   CHOLECYSTECTOMY     ESOPHAGOGASTRODUODENOSCOPY (EGD) WITH PROPOFOL  N/A 12/18/2019   Procedure: ESOPHAGOGASTRODUODENOSCOPY (EGD) WITH PROPOFOL ;  Surgeon:  Janalyn Keene NOVAK, MD;  Location: [facility] ENDOSCOPY;  Service: Endoscopy;  Laterality: N/A;   GASTRIC BYPASS      FAMILY HISTORY: Family History  Problem Relation Age of Onset   Stroke Mother    Heart disease Mother    Asthma Mother    Diabetes Mother    Hypertension Father    Heart disease Father    Diabetes Father    Asthma Brother    Alcoholism Brother    Obesity Brother    Heart disease Brother    Diabetes Maternal Grandmother    Lung cancer Maternal Grandfather    Sjogren's syndrome Daughter    Kidney disease Daughter    Breast cancer Neg Hx     SOCIAL HISTORY:  Social History   Socioeconomic History   Marital status: Married    Spouse name: Not on file   Number of children: 1   Years of education: Not on file   Highest education level: Some college, no degree  Occupational History   Occupation: Magazine features editor  Tobacco Use   Smoking status: Never   Smokeless tobacco: Never  Vaping Use   Vaping status: Never Used  Substance and Sexual Activity   Alcohol use: No   Drug use: No   Sexual activity: Yes    Birth control/protection: Injection  Other Topics Concern   Not on file  Social History Narrative   Remarried in 2018. Has one daughter, one grandchild and 2 step children. Works from home   Social Drivers of Corporate investment banker Strain: Low Risk  (11/25/2023)   Received from [facility]   Overall Financial Resource Strain (CARDIA)    Difficulty of Paying Living Expenses: Not hard at all  Food Insecurity: No Food Insecurity (11/25/2023)   Received from [facility]   Hunger Vital  Sign    Within the past 12 months, you worried that your food would run out before you got the money to buy more.: Never true    Within the past 12 months, the food you bought just didn't last and you didn't have money to get more.: Never true  Transportation Needs: Unknown (11/25/2023)   Received from [facility]  - Transportation    In the past 12 months, has lack of transportation kept you from medical appointments or from getting medications?: No    Lack of Transportation (Non-Medical): Not on file  Physical Activity: Insufficiently Active (05/19/2023)   Exercise Vital Sign    Days of Exercise per Week: 2 days    Minutes of Exercise per Session: 30 min  Stress: Stress Concern Present (05/19/2023)   [employer] of Occupational Health - Occupational Stress Questionnaire    Feeling of Stress : To some extent  Social Connections: Moderately Isolated (05/19/2023)   Social Connection and Isolation Panel    Frequency of Communication with Friends and Family: Three times a week    Frequency of Social Gatherings with Friends and Family: Once a week    Attends Religious Services: Never    Database administrator or Organizations: No    Attends Engineer, structural: Not on file    Marital Status: Married  Catering manager Violence: Not on file     PHYSICAL EXAM  Vitals:   02/03/24 1502  BP: 130/88  Pulse: 81  Weight: (!) 307 lb 8 oz (139.5 kg)  Height: 5' 4 (1.626 m)    Body mass index is 52.78 kg/m.   General: The patient is well-developed and well-nourished and in no acute distress    Neurologic Exam  Mental status: The patient is alert and oriented x 3 at the time of the examination. The patient has apparent normal recent and remote memory, with an apparently normal attention span and concentration ability.   Speech is normal.  Cranial nerves: Extraocular muscles are normal.  Facial strength and sensation is normal.. Hearing is normal and symmetric. Trapezius strength is normal.   Motor:  Muscle bulk is normal.   Muscle tone is normal.  Strength is 5/5 in the arms and legs.except 4++ left APB  Sensory: Intact sensation to touch and vibration in the arms and legs.   Mild Phalen's sign on left.  Coordination: Finger-nose-finger and heel-to-shin is performed well.    Gait  and station: Station is normal.  The gait was normal.  Tandem gait is mildly wide.  Romberg is negative  Reflexes: Deep tendon reflexes are symmetric and normal bilaterally.         Relapsing remitting multiple sclerosis (HCC)  High risk medication use  Gait disturbance  Vitamin D  deficiency  Other fatigue  BMI 50.0-59.9, adult (HCC)   1.   Continue Ocrevus  every 6 months.  Labs were fine 07/2023 Recheck lab work next visit (IgG/IgM and CBC/D) 2.   Continue armodafinil  for sleepiness/fatigue.  Tramadol  for pain prn (usually just uses during the Ocrevus  'crap gap') 3.   Continue other medications.  Baclofen  as needed 4.   Stay active and exercise as tolerated.  We discussed weight loss.  She has already had surgery but gained much of the weight back. 5.   Has history of vitamin B12 and D deficiencies and she takes B12 and vit D supplements   She is s/p gasttric bypass 6.   Return in  6 months or sooner if there are new or worsening neurologic symptoms.    Areonna Bran A. Vear, MD, PhD 02/03/2024, 3:30 PM Certified in Neurology, Clinical Neurophysiology, Sleep Medicine, Pain Medicine and Neuroimaging  [facility] [address], [address] [place], [place] [address] [phone]

## 2024-02-09 ENCOUNTER — Telehealth: Payer: Self-pay | Admitting: Neurology

## 2024-02-09 NOTE — Telephone Encounter (Signed)
 MRI brain [employer] auth: [id number] exp. 02/09/24-03/25/24  MRI cervical [employer] auth: [id number] exp. 02/09/24-03/25/24 sent to [facility] [phone]

## 2024-02-19 ENCOUNTER — Other Ambulatory Visit

## 2024-02-26 ENCOUNTER — Ambulatory Visit: Admission: RE | Admit: 2024-02-26 | Source: Ambulatory Visit

## 2024-05-19 ENCOUNTER — Telehealth: Payer: Self-pay

## 2024-05-19 NOTE — Telephone Encounter (Signed)
 Pharmacy Patient Advocate Encounter   Received notification from Fax that prior authorization for Armodafinil  250mg  Tablets is required/requested.   Insurance verification completed.   The patient is insured through [facility]   Per test claim: PA required; PA submitted to above mentioned insurance via Latent Key/confirmation #/EOC [id number] Status is pending

## 2024-05-19 NOTE — Telephone Encounter (Signed)
 Pharmacy Patient Advocate Encounter  Received notification from [employer] that Prior Authorization for Armodafinil  has been APPROVED from 05/19/2024 to 12/03/2024   PA #/Case ID/Reference #: [id number]

## 2024-06-07 ENCOUNTER — Other Ambulatory Visit: Payer: Self-pay | Admitting: Neurology

## 2024-06-07 NOTE — Telephone Encounter (Signed)
 Last seen on 02/03/24 Follow up scheduled on 09/05/24   Dispensed Days Supply Quantity Provider Pharmacy  ARMODAFINIL  250MG  TABLETS 03/10/2024 90 90 each Sater, [name] LABOR, MD [facility] #...   Rx pending to be signed

## 2024-09-05 ENCOUNTER — Ambulatory Visit: Admitting: Neurology

## 2024-09-05 ENCOUNTER — Telehealth: Payer: Self-pay

## 2024-09-05 ENCOUNTER — Encounter: Payer: Self-pay | Admitting: Neurology

## 2024-09-05 VITALS — BP 145/81 | HR 66 | Ht 64.0 in | Wt 313.0 lb

## 2024-09-05 DIAGNOSIS — R5383 Other fatigue: Secondary | ICD-10-CM

## 2024-09-05 DIAGNOSIS — R269 Unspecified abnormalities of gait and mobility: Secondary | ICD-10-CM | POA: Diagnosis not present

## 2024-09-05 DIAGNOSIS — E559 Vitamin D deficiency, unspecified: Secondary | ICD-10-CM

## 2024-09-05 DIAGNOSIS — Z79899 Other long term (current) drug therapy: Secondary | ICD-10-CM | POA: Diagnosis not present

## 2024-09-05 DIAGNOSIS — G35A Relapsing-remitting multiple sclerosis: Secondary | ICD-10-CM

## 2024-09-05 NOTE — Progress Notes (Signed)
 "  [facility]  PATIENT: Jody Lee DOB: 10/25/1970  REFERRING DOCTOR OR PCP:  Lynwood Gelineau SOURCE: patient and labs, notes in EMR and MRI on PACS ________________________________   HISTORICAL  CHIEF COMPLAINT:  Chief Complaint  Patient presents with   Multiple Sclerosis    Rm11, alone, Pt is here for ms    HISTORY OF PRESENT ILLNESS:  Jody Lee is a 54 y.o. woman with relapsing remitting MS diagnosed in 2005.     Update 09/05/2024 She is on Ocrevus  and had her next infusion in April 2026.   She experienced a mild sensation in the back of the throat first couple infusions but not last couple.  IgG/IgM was normal 07/2023   Vit D was low and she takes daily supplements.  .      Gait is unchanged.  She has a right foot drop and has fallen abut every 3 months.    No LOC but usually hits right knee.    The right leg is weak.   She sometimes has mild spasticity.  She has baclofen  but not using regularly =  occasionally takes one at bedtime.   She has occasional painful spasms in the right leg at night.   She feels arms are doing fine.  Grip is a little reduced on the left but she right handed.      Weakness only in hands not higher up in upper arm or shoulders.   She takes tramadol  when more painful.   Gabapentin  and baclofen  also help.    Se has mild urinary urgency.  She has bowel urgency as well.         Vision is stable.   She sees ophthalmology next week.     She has cognitive issues with reduced focus/attention at times.     She had gained weight back since the  bariatric surgery (Roex en Y) in the past.   She lost 140 pounds but gained 100 back.   Phentermine  was not tolerated.     She has fatigue, both physical and cognitive.  SABRA   She works (from home).   She sleeps well most nights but is sleeping less due to the hours.   She is tired during the day and takes naps . She snores a little when on her back.  NuVigil  helps sleepiness better than Provigil .     Phentermine  was not tolerated.     PSG in 11/2018 did not show OSA (AHI = 3.3)   She is tired at end of day but not sleepy.    She has some sleep maintenance insomnia  Notes some reduced short term memory and reduced focus.   Could not tolerate ADD med's  She has stress with dad getting leg amputation and daughter's diagnosis of Sjogren's with renal failure .    MS History:   She was diagnosed with MS in 2005 after presenting with a spinal cord syndrome. She started with numbness and pain in the feet that worked its way up to her abdomen over a couple days.   Initially, she had an MRI of the spine that showed a plaque consistent with her symptoms. She was referred to Dr. Leopoldo. He did a lumbar puncture and ordered an MRI of the brain. Both were consistent with multiple sclerosis and I started to see her. Initially, she was placed on Betaseron and did very well for the first few years. Then around 2011 or 2012, she had an exacerbation  and testing showed that she had developed antibodies against Betaseron. Therefore, she was switched to Gilenya  at that time. She has been on Gilenya  for the last 4+ years and has done well. Specifically, she has not reported any difficulties with exacerbations and she tolerates the medication well.   Her last MRI was performed at [facility] November 2015.  MRI of the brain from 03/02/2013 showed  typical periventricular T2/FLAIR hyperintense foci, predominantly in the periventricular white matter.   There were no acute foci.  MRI in 06/15/2017 showed a new enahncing lesion so she switched to Ocrevus   Late 2018.   MRI of the brain 05/15/2019 showed no new lesions.  MRI of the brain 04/17/2018 showed no new lesions.  Enhancement previously seen on the 06/15/2017 MRI had resolved.  MRI of the brain 06/15/2017 showed 1 enhancing lesion in the right periatrial white matter not present on the 2017 MRI.  The MRI was otherwise unchanged.  MRI of the cervical spine  06/17/2017 showed about 5 or 6 T2 hyperintense foci within the spinal cord located at C2, C2-C3, C4, C5, C6-C7 and T2-T3.  None of the foci were enhanced.  MRI was felt to be technically better than the 2017 MRI and some interval change could not be ruled out.  Other: We did a PSG 11/2018 but she had only negligible OSA with an AHI = 3.3.   An optional oral appliance was discussed for her snoring.    REVIEW OF SYSTEMS: Constitutional: No fevers, chills, sweats, or change in appetite.   She has fatigue. Eyes: No visual changes, double vision, eye pain Ear, nose and throat: No hearing loss, ear pain, nasal congestion, sore throat Cardiovascular: No chest pain, palpitations Respiratory:  No shortness of breath at rest or with exertion.   No wheezes GastrointestinaI: No nausea, vomiting, diarrhea, abdominal pain, fecal incontinence Genitourinary:  No dysuria, urinary retention or frequency.  No nocturia. Musculoskeletal:  No neck pain, back pain Integumentary: No rash, pruritus, skin lesions Neurological: as above Psychiatric: There is depression and anxiety.  She is tearful at times. Endocrine: No palpitations, diaphoresis, change in appetite, change in weigh or increased thirst Hematologic/Lymphatic:  No anemia, purpura, petechiae. Allergic/Immunologic: No itchy/runny eyes, nasal congestion, recent allergic reactions, rashes  ALLERGIES: Allergies  Allergen Reactions   Nsaids Other (See Comments)    Not supposed to take d/t bariatric surgery    Phentermine      Made her heart race   Ciprofloxacin Rash    Felt like heat/burning rash    HOME MEDICATIONS:  Current Outpatient Medications:    acetaminophen (TYLENOL) 325 MG tablet, Take 650 mg by mouth every 4 (four) hours as needed., Disp: , Rfl:    Armodafinil  250 MG tablet, TAKE 1 TABLET(250 MG) BY MOUTH DAILY, Disp: 90 tablet, Rfl: 1   baclofen  (LIORESAL ) 10 MG tablet, TAKE 1 TABLET(10 MG) BY MOUTH THREE TIMES DAILY (Patient taking  differently: TAKE 1 TABLET(10 MG) BY MOUTH THREE TIMES DAILY as needed), Disp: 270 tablet, Rfl: 3   fexofenadine (ALLEGRA) 180 MG tablet, Take 180 mg by mouth daily., Disp: , Rfl:    gabapentin  (NEURONTIN ) 100 MG capsule, TAKE 1 CAPSULE BY MOUTH 3 TIMES A DAY, Disp: 90 capsule, Rfl: 3   ketoconazole  (NIZORAL ) 2 % cream, Apply 1 Application topically 2 (two) times daily., Disp: 60 g, Rfl: 2   Moringa Oleifera (MORINGA PO), Take 2 tablets by mouth daily., Disp: , Rfl:    ocrelizumab  600 mg in  sodium chloride  0.9 % 500 mL, Inject 600 mg into the vein once., Disp: , Rfl:    traMADol  (ULTRAM ) 50 MG tablet, Take 50 mg by mouth every 6 (six) hours as needed., Disp: , Rfl:    VITAMIN D , CHOLECALCIFEROL, PO, Take 3,000 Units by mouth daily., Disp: , Rfl:    VITAMIN K PO, Take 1 tablet by mouth daily., Disp: , Rfl:  No current facility-administered medications for this visit.  Facility-Administered Medications Ordered in Other Visits:    gadopentetate dimeglumine  (MAGNEVIST ) injection 20 mL, 20 mL, Intravenous, Once PRN, Atzin Buchta, [name] LABOR, MD  PAST MEDICAL HISTORY: Past Medical History:  Diagnosis Date   Anemia    Hypertension    Menorrhagia    MS (multiple sclerosis)    Shingles    right side of chest and back   Vision abnormalities    Vitamin B 12 deficiency    Vitamin D  deficiency     PAST SURGICAL HISTORY: Past Surgical History:  Procedure Laterality Date   CHOLECYSTECTOMY     ESOPHAGOGASTRODUODENOSCOPY (EGD) WITH PROPOFOL  N/A 12/18/2019   Procedure: ESOPHAGOGASTRODUODENOSCOPY (EGD) WITH PROPOFOL ;  Surgeon: Janalyn Keene NOVAK, MD;  Location: [facility] ENDOSCOPY;  Service: Endoscopy;  Laterality: N/A;   GASTRIC BYPASS      FAMILY HISTORY: Family History  Problem Relation Age of Onset   Stroke Mother    Heart disease Mother    Asthma Mother    Diabetes Mother    Hypertension Father    Heart disease Father    Diabetes Father    Asthma Brother    Alcoholism Brother    Obesity  Brother    Heart disease Brother    Diabetes Maternal Grandmother    Lung cancer Maternal Grandfather    Sjogren's syndrome Daughter    Kidney disease Daughter    Breast cancer Neg Hx     SOCIAL HISTORY:  Social History   Socioeconomic History   Marital status: Married    Spouse name: Not on file   Number of children: 1   Years of education: Not on file   Highest education level: Some college, no degree  Occupational History   Occupation: Magazine Features Editor  Tobacco Use   Smoking status: Never   Smokeless tobacco: Never  Vaping Use   Vaping status: Never Used  Substance and Sexual Activity   Alcohol use: No   Drug use: No   Sexual activity: Yes    Birth control/protection: Injection  Other Topics Concern   Not on file  Social History Narrative   Remarried in 2018. Has one daughter, one grandchild and 2 step children. Works from home   Social Drivers of Health   Tobacco Use: Low Risk (09/05/2024)   Patient History    Smoking Tobacco Use: Never    Smokeless Tobacco Use: Never    Passive Exposure: Not on file  Financial Resource Strain: Low Risk  (11/25/2023)   Received [facility]   Overall Financial Resource Strain (CARDIA)    Difficulty of Paying Living Expenses: Not hard at all  Food Insecurity: No Food Insecurity (11/25/2023)   Received from [facility]   Epic    Within the past 12 months, you worried that your food would run out before you got the money to buy more.: Never true    Within the past 12 months, the food you bought just didn't last and you didn't have money to get more.: Never true  Transportation Needs: Unknown (11/25/2023)  Received from [facility] - Transportation    In the past 12 months, has lack of transportation kept you from medical appointments or from getting medications?: No    Lack of Transportation (Non-Medical): Not on file  Physical Activity: Insufficiently Active  (05/19/2023)   Exercise Vital Sign    Days of Exercise per Week: 2 days    Minutes of Exercise per Session: 30 min  Stress: Stress Concern Present (05/19/2023)   [employer] - Occupational Stress Questionnaire    Feeling of Stress : To some extent  Social Connections: Moderately Isolated (05/19/2023)   Social Connection and Isolation Panel    Frequency of Communication with Friends and Family: Three times a week    Frequency of Social Gatherings with Friends and Family: Once a week    Attends Religious Services: Never    Database Administrator or Organizations: No    Attends Engineer, Structural: Not on file    Marital Status: Married  Catering Manager Violence: Not on file  Depression (PHQ2-9): Low Risk (11/26/2023)   Depression (PHQ2-9)    PHQ-2 Score: 1  Alcohol Screen: Low Risk (02/18/2022)   Alcohol Screen    Last Alcohol Screening Score (AUDIT): 0  Housing: Unknown (11/25/2023)   Received from [facility]   Epic    In the last 12 months, was there a time when you were not able to pay the mortgage or rent on time?: No    Number of Times Moved in the Last Year: Not on file    At any time in the past 12 months, were you homeless or living in a shelter (including now)?: No  Utilities: Not At Risk (11/25/2023)   Received from [facility] Utilities    Threatened with loss of utilities: No  Health Literacy: Not on file     PHYSICAL EXAM  Vitals:   09/05/24 1409  BP: (!) 156/84  Pulse: 64  Weight: (!) 313 lb (142 kg)  Height: 5' 4 (1.626 m)    Body mass index is 53.73 kg/m.   General: The patient is well-developed and well-nourished and in no acute distress    Neurologic Exam  Mental status: The patient is alert and oriented x 3 at the time of the examination. The patient has apparent normal recent and remote memory, with an apparently normal attention span and concentration ability.    Speech is normal.  Cranial nerves: Extraocular muscles are normal.  Facial strength and sensation is normal.. Hearing is normal and symmetric. Trapezius strength is normal.   Motor:  Muscle bulk is normal.   Muscle tone is normal.  Strength is 5/5 in the arms and legs.except 4++ left APB  Sensory: She had intact sensation to touch and vibration in the arms and legs  Coordination: Finger-nose-finger and heel-to-shin is performed well.    Gait and station: Station is normal.  The gait was normal.  Tandem gait is mildly wide.  Romberg is negative  Reflexes: Deep tendon reflexes are symmetric and normal bilaterally.         Relapsing remitting multiple sclerosis  High risk medication use  Gait disturbance  Vitamin D  deficiency  Other fatigue   1.   Continue Ocrevus  every 6 months.  We will recheck IgG/IgM and CBC with differential Will check MRI brain and cervical spine (wants to wait until mid year after deductible met).  2.  Continue armodafinil  for sleepiness/fatigue.  Tramadol  for pain prn   3.   Baclofen  as needed 4.   Stay active and exercise as tolerated.  We discussed weight loss.  She has already had surgery but gained much of the weight back. 5.  Continue B12 and vit D supplements   She is s/p gastric bypass 6.   Return in 6 months or sooner if there are new or worsening neurologic symptoms.    Johndaniel Catlin A. Vear, MD, PhD 09/05/2024, 2:15 PM Certified in Neurology, Clinical Neurophysiology, Sleep Medicine, Pain Medicine and Neuroimaging  [facility] [address] [place], [place] [address] [phone]    "

## 2024-09-05 NOTE — Telephone Encounter (Signed)
 Called pt per Dr. Vear and asked her if she would like to come in sooner today instead of 4pm, pt stated that she ould come in the 1:30pm slot but she was going to be 10-68mins late, let front office know and Dr. Vear.

## 2024-09-06 ENCOUNTER — Ambulatory Visit: Payer: Self-pay | Admitting: Neurology

## 2024-09-06 LAB — CBC WITH DIFFERENTIAL/PLATELET
Basophils Absolute: 0.1 10*3/uL (ref 0.0–0.2)
Basos: 1 %
EOS (ABSOLUTE): 0.1 10*3/uL (ref 0.0–0.4)
Eos: 2 %
Hematocrit: 41 % (ref 34.0–46.6)
Hemoglobin: 12.9 g/dL (ref 11.1–15.9)
Immature Grans (Abs): 0 10*3/uL (ref 0.0–0.1)
Immature Granulocytes: 0 %
Lymphocytes Absolute: 1 10*3/uL (ref 0.7–3.1)
Lymphs: 17 %
MCH: 28.2 pg (ref 26.6–33.0)
MCHC: 31.5 g/dL (ref 31.5–35.7)
MCV: 90 fL (ref 79–97)
Monocytes Absolute: 0.6 10*3/uL (ref 0.1–0.9)
Monocytes: 10 %
Neutrophils Absolute: 4 10*3/uL (ref 1.4–7.0)
Neutrophils: 70 %
Platelets: 264 10*3/uL (ref 150–450)
RBC: 4.57 x10E6/uL (ref 3.77–5.28)
RDW: 13.9 % (ref 11.7–15.4)
WBC: 5.7 10*3/uL (ref 3.4–10.8)

## 2024-09-06 LAB — IGG, IGA, IGM
IgG (Immunoglobin G), Serum: 675 mg/dL (ref 586–1602)
IgM (Immunoglobulin M), Srm: 72 mg/dL (ref 26–217)
Immunoglobulin A, (IgA) QN, Serum: 141 mg/dL (ref 87–352)

## 2025-05-02 ENCOUNTER — Ambulatory Visit: Admitting: Neurology
